# Patient Record
Sex: Female | Born: 1950 | Hispanic: No | Marital: Single | Smoking: Never smoker
Health system: Southern US, Community
[De-identification: ages and names within clinical notes are randomized; demographics above are authoritative.]

## PROBLEM LIST (undated history)

## (undated) DIAGNOSIS — E785 Hyperlipidemia, unspecified: Secondary | ICD-10-CM

## (undated) DIAGNOSIS — E119 Type 2 diabetes mellitus without complications: Secondary | ICD-10-CM

## (undated) DIAGNOSIS — K219 Gastro-esophageal reflux disease without esophagitis: Secondary | ICD-10-CM

## (undated) DIAGNOSIS — K5792 Diverticulitis of intestine, part unspecified, without perforation or abscess without bleeding: Secondary | ICD-10-CM

## (undated) DIAGNOSIS — I10 Essential (primary) hypertension: Secondary | ICD-10-CM

## (undated) HISTORY — DX: Hyperlipidemia, unspecified: E78.5

---

## 2018-02-28 DIAGNOSIS — F330 Major depressive disorder, recurrent, mild: Secondary | ICD-10-CM | POA: Diagnosis not present

## 2018-02-28 DIAGNOSIS — K219 Gastro-esophageal reflux disease without esophagitis: Secondary | ICD-10-CM | POA: Diagnosis not present

## 2018-02-28 DIAGNOSIS — I10 Essential (primary) hypertension: Secondary | ICD-10-CM | POA: Diagnosis not present

## 2018-02-28 DIAGNOSIS — M1990 Unspecified osteoarthritis, unspecified site: Secondary | ICD-10-CM | POA: Diagnosis not present

## 2018-02-28 DIAGNOSIS — E7800 Pure hypercholesterolemia, unspecified: Secondary | ICD-10-CM | POA: Diagnosis not present

## 2018-02-28 DIAGNOSIS — M25562 Pain in left knee: Secondary | ICD-10-CM | POA: Diagnosis not present

## 2018-04-11 DIAGNOSIS — I10 Essential (primary) hypertension: Secondary | ICD-10-CM | POA: Diagnosis not present

## 2018-04-11 DIAGNOSIS — M5432 Sciatica, left side: Secondary | ICD-10-CM | POA: Diagnosis not present

## 2018-04-11 DIAGNOSIS — K219 Gastro-esophageal reflux disease without esophagitis: Secondary | ICD-10-CM | POA: Diagnosis not present

## 2018-04-11 DIAGNOSIS — F330 Major depressive disorder, recurrent, mild: Secondary | ICD-10-CM | POA: Diagnosis not present

## 2018-04-11 DIAGNOSIS — E7800 Pure hypercholesterolemia, unspecified: Secondary | ICD-10-CM | POA: Diagnosis not present

## 2018-05-10 DIAGNOSIS — I10 Essential (primary) hypertension: Secondary | ICD-10-CM | POA: Diagnosis not present

## 2018-05-10 DIAGNOSIS — R7303 Prediabetes: Secondary | ICD-10-CM | POA: Diagnosis not present

## 2018-05-22 DIAGNOSIS — Z23 Encounter for immunization: Secondary | ICD-10-CM | POA: Diagnosis not present

## 2018-05-22 DIAGNOSIS — E119 Type 2 diabetes mellitus without complications: Secondary | ICD-10-CM | POA: Diagnosis not present

## 2018-07-05 ENCOUNTER — Other Ambulatory Visit: Payer: Self-pay | Admitting: Family Medicine

## 2018-07-05 ENCOUNTER — Other Ambulatory Visit (HOSPITAL_COMMUNITY)
Admission: RE | Admit: 2018-07-05 | Discharge: 2018-07-05 | Disposition: A | Discharge status: Home / Self Care | Payer: Medicare Other | Source: Ambulatory Visit | Attending: Family Medicine | Admitting: Family Medicine

## 2018-07-05 DIAGNOSIS — E7800 Pure hypercholesterolemia, unspecified: Secondary | ICD-10-CM | POA: Diagnosis not present

## 2018-07-05 DIAGNOSIS — Z0000 Encounter for general adult medical examination without abnormal findings: Secondary | ICD-10-CM | POA: Diagnosis not present

## 2018-07-05 DIAGNOSIS — Z124 Encounter for screening for malignant neoplasm of cervix: Secondary | ICD-10-CM | POA: Insufficient documentation

## 2018-07-05 DIAGNOSIS — H543 Unqualified visual loss, both eyes: Secondary | ICD-10-CM | POA: Diagnosis not present

## 2018-07-05 DIAGNOSIS — H9193 Unspecified hearing loss, bilateral: Secondary | ICD-10-CM | POA: Diagnosis not present

## 2018-07-05 DIAGNOSIS — I10 Essential (primary) hypertension: Secondary | ICD-10-CM | POA: Diagnosis not present

## 2018-07-05 DIAGNOSIS — K219 Gastro-esophageal reflux disease without esophagitis: Secondary | ICD-10-CM | POA: Diagnosis not present

## 2018-07-05 DIAGNOSIS — Z1389 Encounter for screening for other disorder: Secondary | ICD-10-CM | POA: Diagnosis not present

## 2018-07-05 DIAGNOSIS — E119 Type 2 diabetes mellitus without complications: Secondary | ICD-10-CM | POA: Diagnosis not present

## 2018-07-09 LAB — CYTOLOGY - PAP
Diagnosis: NEGATIVE
HPV: NOT DETECTED

## 2018-07-10 DIAGNOSIS — J069 Acute upper respiratory infection, unspecified: Secondary | ICD-10-CM | POA: Diagnosis not present

## 2018-08-10 DIAGNOSIS — M79641 Pain in right hand: Secondary | ICD-10-CM | POA: Diagnosis not present

## 2018-08-10 DIAGNOSIS — Z6821 Body mass index (BMI) 21.0-21.9, adult: Secondary | ICD-10-CM | POA: Diagnosis not present

## 2018-08-10 DIAGNOSIS — M25512 Pain in left shoulder: Secondary | ICD-10-CM | POA: Diagnosis not present

## 2018-08-11 DIAGNOSIS — Z23 Encounter for immunization: Secondary | ICD-10-CM | POA: Diagnosis not present

## 2018-08-30 DIAGNOSIS — H903 Sensorineural hearing loss, bilateral: Secondary | ICD-10-CM | POA: Diagnosis not present

## 2018-09-05 DIAGNOSIS — H04561 Stenosis of right lacrimal punctum: Secondary | ICD-10-CM | POA: Diagnosis not present

## 2018-09-05 DIAGNOSIS — H2513 Age-related nuclear cataract, bilateral: Secondary | ICD-10-CM | POA: Diagnosis not present

## 2018-09-13 ENCOUNTER — Other Ambulatory Visit: Payer: Self-pay | Admitting: Family Medicine

## 2018-09-13 DIAGNOSIS — Z1231 Encounter for screening mammogram for malignant neoplasm of breast: Secondary | ICD-10-CM

## 2018-10-18 DIAGNOSIS — H2511 Age-related nuclear cataract, right eye: Secondary | ICD-10-CM | POA: Diagnosis not present

## 2019-05-02 DIAGNOSIS — H2512 Age-related nuclear cataract, left eye: Secondary | ICD-10-CM | POA: Diagnosis not present

## 2019-05-22 ENCOUNTER — Other Ambulatory Visit: Payer: Self-pay | Admitting: Nephrology

## 2019-05-22 DIAGNOSIS — N183 Chronic kidney disease, stage 3 unspecified: Secondary | ICD-10-CM

## 2019-07-22 ENCOUNTER — Other Ambulatory Visit: Payer: Medicare Other

## 2019-07-25 ENCOUNTER — Other Ambulatory Visit: Payer: Medicare Other

## 2019-07-30 ENCOUNTER — Other Ambulatory Visit: Payer: Medicare Other

## 2019-09-06 ENCOUNTER — Other Ambulatory Visit: Payer: Self-pay | Admitting: Family Medicine

## 2019-09-06 DIAGNOSIS — E2839 Other primary ovarian failure: Secondary | ICD-10-CM

## 2019-09-06 DIAGNOSIS — Z1231 Encounter for screening mammogram for malignant neoplasm of breast: Secondary | ICD-10-CM

## 2019-12-11 ENCOUNTER — Other Ambulatory Visit: Payer: Self-pay | Admitting: Gastroenterology

## 2019-12-11 DIAGNOSIS — R634 Abnormal weight loss: Secondary | ICD-10-CM

## 2019-12-11 DIAGNOSIS — K921 Melena: Secondary | ICD-10-CM

## 2019-12-11 DIAGNOSIS — R1084 Generalized abdominal pain: Secondary | ICD-10-CM

## 2019-12-11 DIAGNOSIS — D500 Iron deficiency anemia secondary to blood loss (chronic): Secondary | ICD-10-CM

## 2019-12-13 ENCOUNTER — Emergency Department (HOSPITAL_COMMUNITY): Payer: Medicare Other

## 2019-12-13 ENCOUNTER — Other Ambulatory Visit: Payer: Medicare Other

## 2019-12-13 ENCOUNTER — Other Ambulatory Visit: Payer: Self-pay

## 2019-12-13 ENCOUNTER — Inpatient Hospital Stay (HOSPITAL_COMMUNITY)
Admission: EM | Admit: 2019-12-13 | Discharge: 2019-12-17 | DRG: 391 | Disposition: A | Discharge status: Home / Self Care | Payer: Medicare Other | Source: Ambulatory Visit | Attending: Internal Medicine | Admitting: Internal Medicine

## 2019-12-13 ENCOUNTER — Encounter (HOSPITAL_COMMUNITY): Payer: Self-pay | Admitting: Emergency Medicine

## 2019-12-13 DIAGNOSIS — D649 Anemia, unspecified: Secondary | ICD-10-CM | POA: Diagnosis present

## 2019-12-13 DIAGNOSIS — Z8249 Family history of ischemic heart disease and other diseases of the circulatory system: Secondary | ICD-10-CM | POA: Diagnosis not present

## 2019-12-13 DIAGNOSIS — E876 Hypokalemia: Secondary | ICD-10-CM | POA: Diagnosis present

## 2019-12-13 DIAGNOSIS — E119 Type 2 diabetes mellitus without complications: Secondary | ICD-10-CM | POA: Diagnosis not present

## 2019-12-13 DIAGNOSIS — K5730 Diverticulosis of large intestine without perforation or abscess without bleeding: Secondary | ICD-10-CM | POA: Diagnosis present

## 2019-12-13 DIAGNOSIS — E872 Acidosis, unspecified: Secondary | ICD-10-CM | POA: Diagnosis present

## 2019-12-13 DIAGNOSIS — K5732 Diverticulitis of large intestine without perforation or abscess without bleeding: Principal | ICD-10-CM | POA: Diagnosis present

## 2019-12-13 DIAGNOSIS — N19 Unspecified kidney failure: Secondary | ICD-10-CM | POA: Diagnosis present

## 2019-12-13 DIAGNOSIS — K5010 Crohn's disease of large intestine without complications: Secondary | ICD-10-CM | POA: Diagnosis present

## 2019-12-13 DIAGNOSIS — Z803 Family history of malignant neoplasm of breast: Secondary | ICD-10-CM | POA: Diagnosis not present

## 2019-12-13 DIAGNOSIS — Z20822 Contact with and (suspected) exposure to covid-19: Secondary | ICD-10-CM | POA: Diagnosis present

## 2019-12-13 DIAGNOSIS — Z808 Family history of malignant neoplasm of other organs or systems: Secondary | ICD-10-CM | POA: Diagnosis not present

## 2019-12-13 DIAGNOSIS — K219 Gastro-esophageal reflux disease without esophagitis: Secondary | ICD-10-CM | POA: Diagnosis present

## 2019-12-13 DIAGNOSIS — Z8349 Family history of other endocrine, nutritional and metabolic diseases: Secondary | ICD-10-CM

## 2019-12-13 DIAGNOSIS — K859 Acute pancreatitis without necrosis or infection, unspecified: Secondary | ICD-10-CM | POA: Diagnosis not present

## 2019-12-13 DIAGNOSIS — I10 Essential (primary) hypertension: Secondary | ICD-10-CM | POA: Diagnosis present

## 2019-12-13 DIAGNOSIS — K5792 Diverticulitis of intestine, part unspecified, without perforation or abscess without bleeding: Secondary | ICD-10-CM | POA: Diagnosis present

## 2019-12-13 DIAGNOSIS — N179 Acute kidney failure, unspecified: Secondary | ICD-10-CM

## 2019-12-13 DIAGNOSIS — Z7984 Long term (current) use of oral hypoglycemic drugs: Secondary | ICD-10-CM

## 2019-12-13 HISTORY — DX: Essential (primary) hypertension: I10

## 2019-12-13 HISTORY — DX: Type 2 diabetes mellitus without complications: E11.9

## 2019-12-13 HISTORY — DX: Gastro-esophageal reflux disease without esophagitis: K21.9

## 2019-12-13 LAB — COMPREHENSIVE METABOLIC PANEL
ALT: 31 U/L (ref 0–44)
AST: 39 U/L (ref 15–41)
Albumin: 3.3 g/dL — ABNORMAL LOW (ref 3.5–5.0)
Alkaline Phosphatase: 100 U/L (ref 38–126)
Anion gap: 17 — ABNORMAL HIGH (ref 5–15)
BUN: 99 mg/dL — ABNORMAL HIGH (ref 8–23)
CO2: 10 mmol/L — ABNORMAL LOW (ref 22–32)
Calcium: 9.2 mg/dL (ref 8.9–10.3)
Chloride: 112 mmol/L — ABNORMAL HIGH (ref 98–111)
Creatinine, Ser: 3.1 mg/dL — ABNORMAL HIGH (ref 0.44–1.00)
GFR calc Af Amer: 17 mL/min — ABNORMAL LOW (ref >60)
GFR calc non Af Amer: 15 mL/min — ABNORMAL LOW (ref >60)
Glucose, Bld: 173 mg/dL — ABNORMAL HIGH (ref 70–99)
Potassium: 4.2 mmol/L (ref 3.5–5.1)
Sodium: 139 mmol/L (ref 135–145)
Total Bilirubin: 0.9 mg/dL (ref 0.3–1.2)
Total Protein: 7.6 g/dL (ref 6.5–8.1)

## 2019-12-13 LAB — CBC
HCT: 31.9 % — ABNORMAL LOW (ref 36.0–46.0)
Hemoglobin: 9.8 g/dL — ABNORMAL LOW (ref 12.0–15.0)
MCH: 25.7 pg — ABNORMAL LOW (ref 26.0–34.0)
MCHC: 30.7 g/dL (ref 30.0–36.0)
MCV: 83.7 fL (ref 80.0–100.0)
Platelets: 559 10*3/uL — ABNORMAL HIGH (ref 150–400)
RBC: 3.81 MIL/uL — ABNORMAL LOW (ref 3.87–5.11)
RDW: 15.3 % (ref 11.5–15.5)
WBC: 14.4 10*3/uL — ABNORMAL HIGH (ref 4.0–10.5)
nRBC: 0.1 % (ref 0.0–0.2)

## 2019-12-13 LAB — LIPASE, BLOOD: Lipase: 117 U/L — ABNORMAL HIGH (ref 11–51)

## 2019-12-13 LAB — POC OCCULT BLOOD, ED: Fecal Occult Bld: NEGATIVE

## 2019-12-13 LAB — TYPE AND SCREEN
ABO/RH(D): O NEG
Antibody Screen: NEGATIVE

## 2019-12-13 LAB — CBG MONITORING, ED: Glucose-Capillary: 101 mg/dL — ABNORMAL HIGH (ref 70–99)

## 2019-12-13 MED ORDER — SODIUM CHLORIDE 0.9 % IV BOLUS: 1000.0000 mL | Freq: Once | INTRAVENOUS | Status: DC

## 2019-12-13 MED ORDER — INSULIN ASPART 100 UNIT/ML ~~LOC~~ SOLN
0.0000 [IU] | SUBCUTANEOUS | Status: DC
  Filled 2019-12-13: qty 0.06

## 2019-12-13 MED ORDER — ONDANSETRON HCL 4 MG/2ML IJ SOLN
4.0000 mg | Freq: Once | INTRAMUSCULAR | Status: AC
  Administered 2019-12-13: 4 mg via INTRAVENOUS
  Filled 2019-12-13: qty 2

## 2019-12-13 MED ORDER — IOHEXOL 9 MG/ML PO SOLN: 500.0000 mL | ORAL | Status: AC

## 2019-12-13 MED ORDER — METRONIDAZOLE IN NACL 5-0.79 MG/ML-% IV SOLN
500.0000 mg | Freq: Three times a day (TID) | INTRAVENOUS | Status: DC
  Administered 2019-12-14 – 2019-12-17 (×10): 500 mg via INTRAVENOUS
  Filled 2019-12-13 (×10): qty 100

## 2019-12-13 MED ORDER — METRONIDAZOLE IN NACL 5-0.79 MG/ML-% IV SOLN
500.0000 mg | Freq: Once | INTRAVENOUS | Status: AC
  Administered 2019-12-13: 500 mg via INTRAVENOUS
  Filled 2019-12-13: qty 100

## 2019-12-13 MED ORDER — ONDANSETRON HCL 4 MG/2ML IJ SOLN
4.0000 mg | Freq: Four times a day (QID) | INTRAMUSCULAR | Status: DC | PRN
  Administered 2019-12-14 – 2019-12-16 (×5): 4 mg via INTRAVENOUS
  Filled 2019-12-13 (×5): qty 2

## 2019-12-13 MED ORDER — SODIUM CHLORIDE 0.9 % IV SOLN: 1.0000 g | INTRAVENOUS | Status: DC

## 2019-12-13 MED ORDER — MORPHINE SULFATE (PF) 2 MG/ML IV SOLN
1.0000 mg | INTRAVENOUS | Status: DC | PRN
  Administered 2019-12-14 – 2019-12-16 (×4): 2 mg via INTRAVENOUS
  Filled 2019-12-13 (×5): qty 1

## 2019-12-13 MED ORDER — IOHEXOL 9 MG/ML PO SOLN
ORAL | Status: AC
  Administered 2019-12-14: 500 mL via ORAL
  Filled 2019-12-13: qty 1000

## 2019-12-13 MED ORDER — SODIUM CHLORIDE 0.9 % IV SOLN
2.0000 g | Freq: Every day | INTRAVENOUS | Status: DC
  Administered 2019-12-14: 2 g via INTRAVENOUS
  Filled 2019-12-13 (×2): qty 20

## 2019-12-13 MED ORDER — SODIUM CHLORIDE 0.9 % IV BOLUS
1000.0000 mL | Freq: Once | INTRAVENOUS | Status: AC
  Administered 2019-12-13: 20:00:00 1000 mL via INTRAVENOUS

## 2019-12-13 MED ORDER — MORPHINE SULFATE (PF) 4 MG/ML IV SOLN
4.0000 mg | Freq: Once | INTRAVENOUS | Status: AC
  Administered 2019-12-13: 23:00:00 4 mg via INTRAVENOUS
  Filled 2019-12-13: qty 1

## 2019-12-13 MED ORDER — CIPROFLOXACIN IN D5W 400 MG/200ML IV SOLN
400.0000 mg | Freq: Once | INTRAVENOUS | Status: DC
  Filled 2019-12-13: qty 200

## 2019-12-13 MED ORDER — MORPHINE SULFATE (PF) 4 MG/ML IV SOLN
4.0000 mg | Freq: Once | INTRAVENOUS | Status: AC
  Administered 2019-12-13: 20:00:00 4 mg via INTRAVENOUS
  Filled 2019-12-13: qty 1

## 2019-12-13 MED ORDER — STERILE WATER FOR INJECTION IV SOLN
INTRAVENOUS | Status: AC
  Filled 2019-12-13 (×2): qty 850

## 2019-12-13 NOTE — H&P (Signed)
History and Physical    Alfonso Ramus Goetzke ZDG:[id number] DOB: [birth year] DOA: 12/13/2019  PCP: [name], MD   Patient coming from: Home   Chief Complaint: Abdominal pain, change in bowel habits   HPI: Landra Howze is a 69 y.o. female with medical history significant for hypertension, type 2 diabetes mellitus, kidney disease, and GERD, now presenting to the emergency department with abdominal pain, malaise, and change in bowel habits.  History is obtained from patient and also from her daughter by phone.  Patient has been complaining of change in bowel habits for at least a month now, mainly just passing some mucus and sometimes a small amount of blood.  She has had a loss of appetite associated with this, nausea, not much vomiting, and no diarrhea.  The abdominal pain had been mainly in the lower quadrants initially, but now also in the upper abdomen.  She has not noted any fevers.  She is not sure about weight loss but reports not eating much for at least a month now.  She has not noted any change in her urination.  Patient reports a history of kidney disease, but is unable to provide much detail.  Patient's daughter believes that patient is established with a nephrologist within the past couple months.  She has never had a colonoscopy.  Per patient's daughter, the patient has trouble with her hearing and also becomes quite anxious in the doctor's office or hospital.  ED Course: Upon arrival to the ED, patient is found to be afebrile, saturating well on room air, tachycardic in the 110s, and with stable blood pressure.  Chemistry panel is notable for bicarbonate of 10, BUN 99, and creatinine 3.10.  CBC with leukocytosis to 14,400, thrombocytosis, and normocytic anemia with hemoglobin 9.8.  Lipase was elevated to 117.  Fecal occult blood testing was negative.  COVID-19 PCR screening test is in process.  Type and screen was performed and the patient was given 2 L normal saline, Rocephin and  Flagyl, morphine, and Zofran in the ED.  Review of Systems:  All other systems reviewed and apart from HPI, are negative.  Past Medical History:  Diagnosis Date  . Acid reflux   . Diabetes mellitus without complication (HCC)    pre diabetes  . Hypertension     History reviewed. No pertinent surgical history.   reports that she has never smoked. She has never used smokeless tobacco. She reports current alcohol use. No history on file for drug.  No Known Allergies  Family History  Problem Relation Age of Onset  . Lung disease Mother   . Hypertension Father   . Hyperlipidemia Father   . Bowel Disease Sister   . Breast cancer Sister   . Thyroid cancer Sister      Prior to Admission medications   Medication Sig Start Date End Date Taking? Authorizing Provider  amLODipine (NORVASC) 2.5 MG tablet Take 2.5 mg by mouth daily. 12/03/19  Yes [provider]  DULoxetine (CYMBALTA) 30 MG capsule Take 90 mg by mouth daily. 08/19/19  Yes [provider]  lisinopril (ZESTRIL) 20 MG tablet Take 20 mg by mouth daily. 11/04/19  Yes [provider]  metFORMIN (GLUCOPHAGE) 500 MG tablet Take 500 mg by mouth at bedtime. 10/07/19  Yes [provider]  omeprazole (PRILOSEC) 40 MG capsule Take 40 mg by mouth daily. 10/07/19  Yes [provider]  rosuvastatin (CRESTOR) 20 MG tablet Take 20 mg by mouth daily. 10/08/19  Yes [provider]  tiZANidine (ZANAFLEX) 4 MG tablet Take 4 mg by mouth every 8 (eight) hours as needed for muscle spasms.  11/25/19  Yes [provider]    Physical Exam: Vitals:   12/13/19 2130 12/13/19 2230 12/13/19 2300 12/14/19 0000  BP: 116/62 132/76 130/63 125/64  Pulse: 96 (!) 102 96 99  Resp:    14  Temp:      TempSrc:      SpO2: 97% 96% 96% 98%    Constitutional: NAD, calm  Eyes: PERTLA, lids and conjunctivae normal ENMT: Mucous membranes are moist. Posterior pharynx clear of any exudate or lesions.    Neck: normal, supple, no masses, no thyromegaly Respiratory:  no wheezing, no crackles. No accessory muscle use.  Cardiovascular: S1 & S2 heard, regular rate and rhythm. No extremity edema.   Abdomen: Soft, tender in lower quadrants and epigastrium, no rebound pain or guarding. Bowel sounds active.  Musculoskeletal: no clubbing / cyanosis. No joint deformity upper and lower extremities.  Skin: no significant rashes, lesions, ulcers. Poor turgor. Neurologic: No facial asymmetry, no dysarthria. Gross hearing deficit. Sensation intact. Moving all extremities.  Psychiatric: Alert. Has trouble answering some basic questions, though may be more d/t hearing problems than disorientation. Pleasant and cooperative.     Labs on Admission: I have personally reviewed following labs and imaging studies  CBC: Recent Labs  Lab 12/13/19 1646  WBC 14.4*  HGB 9.8*  HCT 31.9*  MCV 83.7  PLT 559*   Basic Metabolic Panel: Recent Labs  Lab 12/13/19 1646  NA 139  K 4.2  CL 112*  CO2 10*  GLUCOSE 173*  BUN 99*  CREATININE 3.10*  CALCIUM 9.2   GFR: CrCl cannot be calculated (Unknown ideal weight.). Liver Function Tests: Recent Labs  Lab 12/13/19 1646  AST 39  ALT 31  ALKPHOS 100  BILITOT 0.9  PROT 7.6  ALBUMIN 3.3*   Recent Labs  Lab 12/13/19 2248  LIPASE 117*   No results for input(s): AMMONIA in the last 168 hours. Coagulation Profile: No results for input(s): INR, PROTIME in the last 168 hours. Cardiac Enzymes: No results for input(s): CKTOTAL, CKMB, CKMBINDEX, TROPONINI in the last 168 hours. BNP (last 3 results) No results for input(s): PROBNP in the last 8760 hours. HbA1C: No results for input(s): HGBA1C in the last 72 hours. CBG: Recent Labs  Lab 12/13/19 2357  GLUCAP 101*   Lipid Profile: No results for input(s): CHOL, HDL, LDLCALC, TRIG, CHOLHDL, LDLDIRECT in the last 72 hours. Thyroid Function Tests: No results for input(s): TSH, T4TOTAL, FREET4, T3FREE,  THYROIDAB in the last 72 hours. Anemia Panel: No results for input(s): VITAMINB12, FOLATE, FERRITIN, TIBC, IRON, RETICCTPCT in the last 72 hours. Urine analysis: No results found for: COLORURINE, APPEARANCEUR, LABSPEC, PHURINE, GLUCOSEU, HGBUR, BILIRUBINUR, KETONESUR, PROTEINUR, UROBILINOGEN, NITRITE, LEUKOCYTESUR Sepsis Labs: @LABRCNTIP (procalcitonin:4,lacticidven:4) )No results found for this or any previous visit (from the past 240 hour(s)).   Radiological Exams on Admission: CT ABDOMEN PELVIS WO CONTRAST  Result Date: 12/13/2019 CLINICAL DATA:  Diverticulitis suspected EXAM: CT ABDOMEN AND PELVIS WITHOUT CONTRAST TECHNIQUE: Multidetector CT imaging of the abdomen and pelvis was performed following the standard protocol without IV contrast. COMPARISON:  None FINDINGS: Lower chest: The basilar atelectatic changes. Lung bases otherwise clear. Normal heart size. No pericardial effusion. Small fat containing Morgagni hernia. Hepatobiliary: No focal liver abnormality is seen. No gallstones, gallbladder wall thickening, or biliary dilatation. Pancreas: There is partial fatty replacement of the pancreas. Some edematous changes in  the pancreatic head are noted with faint peripancreatic inflammation. No pancreatic ductal dilatation is seen. Spleen: Normal in size without focal abnormality. Adrenals/Urinary Tract: Adrenal glands are unremarkable. Kidneys are normal, without renal calculi, focal lesion, or hydronephrosis. Bladder is unremarkable. Stomach/Bowel: Stomach is distended with ingested enteric contrast media. Duodenum takes a normal course. No small bowel dilatation or wall thickening. A normal appendix is visualized. Proximal colon has a normal appearance aside from some mild tortuosity of the transverse colon displaced inferiorly into the lower abdomen. There is extensive distal colonic diverticulosis and segmental thickening of the distal colon with pericolonic inflammatory change, possible culprit  diverticulum in the left lower quadrant (5/62). Adjacent phlegmonous change without free fluid or air. No organized abscess or collection. Vascular/Lymphatic: Atherosclerotic plaque within the normal caliber aorta. No pathologically enlarged nodes in the abdomen or pelvis. Few reactive low mesenteric and upper abdominal nodes. Reproductive: Slightly retroverted uterus. No concerning adnexal lesions. Other: Phlegmonous change in the left lower quadrant adjacent the thickened sigmoid. No free fluid. No free air. No organized collection or abscess. No bowel containing hernia. Musculoskeletal: Dextrocurvature of the lumbar spine with an apex at L2. Multilevel degenerative changes present throughout the imaged spine and both hips. No worrisome osseous lesions are seen. IMPRESSION: 1. Extensive distal colonic diverticulosis and segmental thickening of the distal colon with pericolonic inflammatory change, possible culprit diverticulum in the left lower quadrant. Adjacent phlegmonous change without free fluid or air. No organized collection or abscess. Findings could reflect an acute diverticulitis versus segmental colitis associated with diverticulosis (SCAD). Patient should undergo colonoscopic evaluation following resolution of acute symptoms to exclude underlying mass. 2. Edematous changes of the partially fatty replaced pancreatic parenchyma with adjacent peripancreatic stranding. Recommend correlation with lipase as features could reflect an acute edematous interstitial pancreatitis. No visible calcified gallstones or dilatation of the biliary tree. 3. Small fat containing Morgagni hernia. 4. Multilevel degenerative changes throughout the imaged spine and both hips. 5.  Aortic Atherosclerosis (ICD10-I70.0). These results were called by telephone at the time of interpretation on 12/13/2019 at 10:12 pm to provider [facility] , who verbally acknowledged these results. Electronically Signed   By: Lovena Le M.D.   On:  12/13/2019 22:12    Assessment/Plan   1. Abdominal pain with sepsis suspected secondary to diverticulitis; pancreatitis  - Presents with progressive abdominal pain, nausea, anorexia, and change in bowel-habits  - She has tachycardia and leukocytosis in ED with stable BP, no fever, and CT-findings concerning for possible acute diverticulitis or SCAD, and with underlying mass not excluded; there is also pancreatic edema and inflammation on CT with lipase 117  - She was started on IVF and antibiotics in ED  - Continue IVF hydration, pain-control, Rocephin and Flagyl, bowel-rest, obtain medical records     2. Renal failure; acidosis  - BUN 99 and SCr 3.10 on admission with bicarbonate of 10 and no prior labs available for comparison  - Kidneys appear normal with no hydronephrosis on CT  - Patient reports recent hx of kidney disease but baseline renal fxn unclear  - Acute prerenal azotemia likely in setting of recent N/V and anorexia, compounded by ACE-inhibition   - Hold lisinopril, renally-dose medications, check UA and urine chemistries, start isotonic bicarbonate infusion, repeat chem panel in am, obtain medical records    3. Type II DM  - No A1c on file  - Hold metformin, use low-intensity SSI with Novolog for now    4. Hypertension  - BP at  goal, continue Norvasc    5. Anemia  - Hgb is 9.8 on admission, no priors available for comparison  - FOBT is negative in ED, possibly from CKD though baseline renal function unclear    DVT prophylaxis: sq heparin  Code Status: Full Family Communication: Daughter updated by phone  Consults called: none  Admission status: Inpatient. Patient has multiple acute problems including suspected diverticulitis, renal failure, possibly GI malignancy, is not able to tolerate a diet, and will require inpatient management.     Briscoe Deutscher, MD [facility] Pager [phone]  If 7PM-7AM, please contact night-coverage [email or web address] Password  [facility]  12/14/2019, 12:13 AM

## 2019-12-13 NOTE — ED Notes (Signed)
Pt ambulated to bathroom 

## 2019-12-13 NOTE — ED Notes (Signed)
Patient transported to CT 

## 2019-12-13 NOTE — ED Provider Notes (Signed)
[facility]-EMERGENCY DEPT Provider Note   CSN: [id number] Arrival date & time: 12/13/19  1607     History Chief Complaint  Patient presents with  . Abnormal Lab    Sabrina Rodriguez is a 69 y.o. female.  The history is provided by the patient and medical records. No language interpreter was used.  Abnormal Lab    69 year old female with history of diabetes, hypertension, GERD, sent here at the recommendation of her GI specialist for evaluation of abdominal pain.  Patient report for the past 2-1/2 months she has had progressive worsening pain throughout her abdomen.  Pain initially started at her left lower quadrant and now has spread throughout her abdomen.  Pain is sharp, moderate to severe, with decrease in appetite, having mucus in her stools, as well as having persistent nausea.  States that she has been eating and drinking much.  She does endorse some lightheadedness and generalized weakness.  She does not complain of any fever but endorses occasional chills.  Denies chest pain shortness of breath productive cough or dysuria.  She has not noticed any black tarry stool or any abnormal bleeding.  Patient denies any history of active cancer.  Denies alcohol or tobacco abuse.  No prior history of diverticulitis.  Currently rates her pain as 8 out of 10 throughout abdomen.  Past Medical History:  Diagnosis Date  . Acid reflux   . Diabetes mellitus without complication (HCC)    pre diabetes  . Hypertension     There are no problems to display for this patient.   History reviewed. No pertinent surgical history.   OB History   No obstetric history on file.     No family history on file.  Social History   Tobacco Use  . Smoking status: Not on file  Substance Use Topics  . Alcohol use: Not on file  . Drug use: Not on file    Home Medications Prior to Admission medications   Not on File    Allergies    Patient has no known allergies.  Review  of Systems   Review of Systems  All other systems reviewed and are negative.   Physical Exam Updated Vital Signs BP (!) 127/99 (BP Location: Left Arm)   Pulse (!) 113   Temp 97.9 F (36.6 C) (Oral)   Resp 18   SpO2 100%   Physical Exam Vitals and nursing note reviewed.  Constitutional:      General: She is not in acute distress.    Appearance: She is well-developed.     Comments: Elderly female appears uncomfortable but nontoxic  HENT:     Head: Atraumatic.  Eyes:     Conjunctiva/sclera: Conjunctivae normal.  Cardiovascular:     Rate and Rhythm: Tachycardia present.     Pulses: Normal pulses.     Heart sounds: Normal heart sounds.  Pulmonary:     Effort: Pulmonary effort is normal.     Breath sounds: Normal breath sounds.  Abdominal:     Palpations: Abdomen is soft.     Tenderness: There is abdominal tenderness (Diffuse abdominal tenderness more significant to left lower quadrant on palpation no guarding or rebound tenderness.).  Genitourinary:    Comments: Chaperone present during exam.  Normal rectal tone, no obvious mass, normal color stool on glove, no thrombosed hemorrhoid. Musculoskeletal:     Cervical back: Neck supple.  Skin:    Findings: No rash.  Neurological:     Mental Status: She  is alert and oriented to person, place, and time.  Psychiatric:        Mood and Affect: Mood normal.     ED Results / Procedures / Treatments   Labs (all labs ordered are listed, but only abnormal results are displayed) Labs Reviewed  COMPREHENSIVE METABOLIC PANEL - Abnormal; Notable for the following components:      Result Value   Chloride 112 (*)    CO2 10 (*)    Glucose, Bld 173 (*)    BUN 99 (*)    Creatinine, Ser 3.10 (*)    Albumin 3.3 (*)    GFR calc non Af Amer 15 (*)    GFR calc Af Amer 17 (*)    Anion gap 17 (*)    All other components within normal limits  CBC - Abnormal; Notable for the following components:   WBC 14.4 (*)    RBC 3.81 (*)     Hemoglobin 9.8 (*)    HCT 31.9 (*)    MCH 25.7 (*)    Platelets 559 (*)    All other components within normal limits  LIPASE, BLOOD - Abnormal; Notable for the following components:   Lipase 117 (*)    All other components within normal limits  SARS CORONAVIRUS 2 (TAT 6-24 HRS)  POC OCCULT BLOOD, ED  TYPE AND SCREEN  ABO/RH    EKG None  Radiology CT ABDOMEN PELVIS WO CONTRAST  Result Date: 12/13/2019 CLINICAL DATA:  Diverticulitis suspected EXAM: CT ABDOMEN AND PELVIS WITHOUT CONTRAST TECHNIQUE: Multidetector CT imaging of the abdomen and pelvis was performed following the standard protocol without IV contrast. COMPARISON:  None FINDINGS: Lower chest: The basilar atelectatic changes. Lung bases otherwise clear. Normal heart size. No pericardial effusion. Small fat containing Morgagni hernia. Hepatobiliary: No focal liver abnormality is seen. No gallstones, gallbladder wall thickening, or biliary dilatation. Pancreas: There is partial fatty replacement of the pancreas. Some edematous changes in the pancreatic head are noted with faint peripancreatic inflammation. No pancreatic ductal dilatation is seen. Spleen: Normal in size without focal abnormality. Adrenals/Urinary Tract: Adrenal glands are unremarkable. Kidneys are normal, without renal calculi, focal lesion, or hydronephrosis. Bladder is unremarkable. Stomach/Bowel: Stomach is distended with ingested enteric contrast media. Duodenum takes a normal course. No small bowel dilatation or wall thickening. A normal appendix is visualized. Proximal colon has a normal appearance aside from some mild tortuosity of the transverse colon displaced inferiorly into the lower abdomen. There is extensive distal colonic diverticulosis and segmental thickening of the distal colon with pericolonic inflammatory change, possible culprit diverticulum in the left lower quadrant (5/62). Adjacent phlegmonous change without free fluid or air. No organized abscess or  collection. Vascular/Lymphatic: Atherosclerotic plaque within the normal caliber aorta. No pathologically enlarged nodes in the abdomen or pelvis. Few reactive low mesenteric and upper abdominal nodes. Reproductive: Slightly retroverted uterus. No concerning adnexal lesions. Other: Phlegmonous change in the left lower quadrant adjacent the thickened sigmoid. No free fluid. No free air. No organized collection or abscess. No bowel containing hernia. Musculoskeletal: Dextrocurvature of the lumbar spine with an apex at L2. Multilevel degenerative changes present throughout the imaged spine and both hips. No worrisome osseous lesions are seen. IMPRESSION: 1. Extensive distal colonic diverticulosis and segmental thickening of the distal colon with pericolonic inflammatory change, possible culprit diverticulum in the left lower quadrant. Adjacent phlegmonous change without free fluid or air. No organized collection or abscess. Findings could reflect an acute diverticulitis versus segmental colitis associated with diverticulosis (  SCAD). Patient should undergo colonoscopic evaluation following resolution of acute symptoms to exclude underlying mass. 2. Edematous changes of the partially fatty replaced pancreatic parenchyma with adjacent peripancreatic stranding. Recommend correlation with lipase as features could reflect an acute edematous interstitial pancreatitis. No visible calcified gallstones or dilatation of the biliary tree. 3. Small fat containing Morgagni hernia. 4. Multilevel degenerative changes throughout the imaged spine and both hips. 5.  Aortic Atherosclerosis (ICD10-I70.0). These results were called by telephone at the time of interpretation on 12/13/2019 at 10:12 pm to provider [facility] , who verbally acknowledged these results. Electronically Signed   By: Kreg Shropshire M.D.   On: 12/13/2019 22:12    Procedures Procedures (including critical care time)  Medications Ordered in ED Medications  iohexol  (OMNIPAQUE) 9 MG/ML oral solution 500 mL (has no administration in time range)  iohexol (OMNIPAQUE) 9 MG/ML oral solution (has no administration in time range)  sodium chloride 0.9 % bolus 1,000 mL (0 mLs Intravenous Stopped 12/13/19 2054)  ondansetron (ZOFRAN) injection 4 mg (4 mg Intravenous Given 12/13/19 2009)  morphine 4 MG/ML injection 4 mg (4 mg Intravenous Given 12/13/19 2009)    ED Course  I have reviewed the triage vital signs and the nursing notes.  Pertinent labs & imaging results that were available during my care of the patient were reviewed by me and considered in my medical decision making (see chart for details).    MDM Rules/Calculators/A&P                      BP 116/62   Pulse 96   Temp 97.9 F (36.6 C) (Oral)   Resp 16   SpO2 97%   Final Clinical Impression(s) / ED Diagnoses Final diagnoses:  Acute pancreatitis, unspecified complication status, unspecified pancreatitis type  Diverticulitis  AKI (acute kidney injury) (HCC)    Rx / DC Orders ED Discharge Orders    None     8:00 PM Patient with abdominal pain, and decreased appetite which seems to be a progressive worsening symptoms for the past several weeks.  I suspect patient may have diverticular disease given her presentation.  Labs remarkable for elevated white count of 14.4, hemoglobin is 9.8 however Hemoccult obtained by me is negative.  She has evidence of significant AKI with BUN 99, creatinine 3.1.  No prior renal value for comparison.  Will obtain abdominal pelvis CT scan as well as will consult for admission.  Care discussed with DR. Adriana Simas.    11:11 PM Labs remarkable for elevated lipase of 117, fecal blood test is negative, as mentioned earlier, evidence of AKI with BUN 99, creatinine 3.1 without any prior values for comparison, elevated white count of 14.4, hemoglobin is 9.8.  An abdominal pelvis CT scan demonstrate extensive distal colonic diverticulosis with possible diverticulitis versus colitis.   Furthermore, edematous changes and adjacent peripancreatic stranding suggestive of interstitial pancreatitis.  This finding is corresponding to patient's presenting complaint.  We will continue with IV hydration, pain medication, will initiate Cipro and Flagyl antibiotic to treat for diverticulitis/colitis, and will consult for admission. Her gi specialist is with [facility].   11:25 PM Appreciate consultation from [facility] Dr. Antionette Char who agrees to see and admit pt for further care. No known hx of active cancer.   Alfonso Ramus Holtry was evaluated in Emergency Department on 12/13/2019 for the symptoms described in the history of present illness. She was evaluated in the context of the global COVID-19 pandemic, which necessitated  consideration that the patient might be at risk for infection with the SARS-CoV-2 virus that causes COVID-19. Institutional protocols and algorithms that pertain to the evaluation of patients at risk for COVID-19 are in a state of rapid change based on information released by regulatory bodies including the CDC and federal and state organizations. These policies and algorithms were followed during the patient's care in the ED.    Fayrene Helper, PA-C 12/13/19 2326    Donnetta Hutching, MD 12/14/19 [phone]

## 2019-12-13 NOTE — ED Triage Notes (Signed)
Pt reports that she was seen at [facility] today and had blood work done that was "all abnormal so doctor told me to go to the hospital to be admitted".  Pt reports that blood showed in her stool sample today but she hasnt visual seen blood in stool. C/o headache, back pains, "everything hurts". Reports hasnt been eating because eating causes more nausea.  Showed her cell phone to nurse that son talked to PCP office with lab information stating WBC 15.5, Creatinine 3.5, BUN 99 and blood in stool so Dr wants a CT scan.

## 2019-12-13 NOTE — ED Notes (Signed)
Pt. Documented in error see above note in chart. 

## 2019-12-13 NOTE — ED Notes (Signed)
Blue top in lab if needed.  

## 2019-12-14 ENCOUNTER — Other Ambulatory Visit: Payer: Self-pay

## 2019-12-14 LAB — COMPREHENSIVE METABOLIC PANEL
ALT: 26 U/L (ref 0–44)
AST: 28 U/L (ref 15–41)
Albumin: 2.2 g/dL — ABNORMAL LOW (ref 3.5–5.0)
Alkaline Phosphatase: 69 U/L (ref 38–126)
Anion gap: 12 (ref 5–15)
BUN: 78 mg/dL — ABNORMAL HIGH (ref 8–23)
CO2: 13 mmol/L — ABNORMAL LOW (ref 22–32)
Calcium: 7.1 mg/dL — ABNORMAL LOW (ref 8.9–10.3)
Chloride: 115 mmol/L — ABNORMAL HIGH (ref 98–111)
Creatinine, Ser: 1.88 mg/dL — ABNORMAL HIGH (ref 0.44–1.00)
GFR calc Af Amer: 31 mL/min — ABNORMAL LOW (ref >60)
GFR calc non Af Amer: 27 mL/min — ABNORMAL LOW (ref >60)
Glucose, Bld: 90 mg/dL (ref 70–99)
Potassium: 3 mmol/L — ABNORMAL LOW (ref 3.5–5.1)
Sodium: 140 mmol/L (ref 135–145)
Total Bilirubin: 0.8 mg/dL (ref 0.3–1.2)
Total Protein: 5.4 g/dL — ABNORMAL LOW (ref 6.5–8.1)

## 2019-12-14 LAB — CBC WITH DIFFERENTIAL/PLATELET
Abs Immature Granulocytes: 0.07 10*3/uL (ref 0.00–0.07)
Basophils Absolute: 0 10*3/uL (ref 0.0–0.1)
Basophils Relative: 0 %
Eosinophils Absolute: 0.1 10*3/uL (ref 0.0–0.5)
Eosinophils Relative: 1 %
HCT: 24.9 % — ABNORMAL LOW (ref 36.0–46.0)
Hemoglobin: 7.9 g/dL — ABNORMAL LOW (ref 12.0–15.0)
Immature Granulocytes: 1 %
Lymphocytes Relative: 11 %
Lymphs Abs: 1.1 10*3/uL (ref 0.7–4.0)
MCH: 26.2 pg (ref 26.0–34.0)
MCHC: 31.7 g/dL (ref 30.0–36.0)
MCV: 82.7 fL (ref 80.0–100.0)
Monocytes Absolute: 0.9 10*3/uL (ref 0.1–1.0)
Monocytes Relative: 9 %
Neutro Abs: 8.1 10*3/uL — ABNORMAL HIGH (ref 1.7–7.7)
Neutrophils Relative %: 78 %
Platelets: 451 10*3/uL — ABNORMAL HIGH (ref 150–400)
RBC: 3.01 MIL/uL — ABNORMAL LOW (ref 3.87–5.11)
RDW: 15.2 % (ref 11.5–15.5)
WBC: 10.3 10*3/uL (ref 4.0–10.5)
nRBC: 0 % (ref 0.0–0.2)

## 2019-12-14 LAB — HEMOGLOBIN A1C
Hgb A1c MFr Bld: 6.5 % — ABNORMAL HIGH (ref 4.8–5.6)
Mean Plasma Glucose: 139.85 mg/dL

## 2019-12-14 LAB — URINALYSIS, COMPLETE (UACMP) WITH MICROSCOPIC
Bilirubin Urine: NEGATIVE
Glucose, UA: NEGATIVE mg/dL
Hgb urine dipstick: NEGATIVE
Ketones, ur: 5 mg/dL — AB
Nitrite: NEGATIVE
Protein, ur: 30 mg/dL — AB
Specific Gravity, Urine: 1.011 (ref 1.005–1.030)
pH: 5 (ref 5.0–8.0)

## 2019-12-14 LAB — CBG MONITORING, ED
Glucose-Capillary: 91 mg/dL (ref 70–99)
Glucose-Capillary: 87 mg/dL (ref 70–99)
Glucose-Capillary: 93 mg/dL (ref 70–99)

## 2019-12-14 LAB — CREATININE, URINE, RANDOM: Creatinine, Urine: 104 mg/dL

## 2019-12-14 LAB — SARS CORONAVIRUS 2 (TAT 6-24 HRS): SARS Coronavirus 2: NEGATIVE

## 2019-12-14 LAB — GLUCOSE, CAPILLARY
Glucose-Capillary: 111 mg/dL — ABNORMAL HIGH (ref 70–99)
Glucose-Capillary: 121 mg/dL — ABNORMAL HIGH (ref 70–99)

## 2019-12-14 LAB — HIV ANTIBODY (ROUTINE TESTING W REFLEX): HIV Screen 4th Generation wRfx: NONREACTIVE

## 2019-12-14 LAB — SODIUM, URINE, RANDOM: Sodium, Ur: <10 mmol/L

## 2019-12-14 LAB — ABO/RH: ABO/RH(D): O NEG

## 2019-12-14 MED ORDER — SODIUM CHLORIDE 0.9 % IV SOLN: INTRAVENOUS | Status: DC

## 2019-12-14 MED ORDER — SODIUM CHLORIDE 0.9 % IV SOLN
2.0000 g | INTRAVENOUS | Status: DC
  Administered 2019-12-14 – 2019-12-16 (×3): 2 g via INTRAVENOUS
  Filled 2019-12-14: qty 2
  Filled 2019-12-14 (×2): qty 20
  Filled 2019-12-14: qty 2

## 2019-12-14 MED ORDER — HEPARIN SODIUM (PORCINE) 5000 UNIT/ML IJ SOLN
5000.0000 [IU] | Freq: Three times a day (TID) | INTRAMUSCULAR | Status: DC
  Administered 2019-12-14 – 2019-12-17 (×11): 5000 [IU] via SUBCUTANEOUS
  Filled 2019-12-14 (×11): qty 1

## 2019-12-14 MED ORDER — ACETAMINOPHEN 650 MG RE SUPP: 650.0000 mg | Freq: Four times a day (QID) | RECTAL | Status: DC | PRN

## 2019-12-14 MED ORDER — POTASSIUM CHLORIDE CRYS ER 20 MEQ PO TBCR
20.0000 meq | EXTENDED_RELEASE_TABLET | Freq: Once | ORAL | Status: AC
  Administered 2019-12-14: 07:00:00 20 meq via ORAL
  Filled 2019-12-14: qty 1

## 2019-12-14 MED ORDER — POTASSIUM CHLORIDE 10 MEQ/100ML IV SOLN: 10.0000 meq | INTRAVENOUS | Status: DC

## 2019-12-14 MED ORDER — BOOST / RESOURCE BREEZE PO LIQD CUSTOM
1.0000 | Freq: Three times a day (TID) | ORAL | Status: DC
  Administered 2019-12-14 – 2019-12-17 (×5): 1 via ORAL

## 2019-12-14 MED ORDER — POTASSIUM CHLORIDE 10 MEQ/100ML IV SOLN
10.0000 meq | Freq: Once | INTRAVENOUS | Status: AC
  Administered 2019-12-14: 10 meq via INTRAVENOUS
  Filled 2019-12-14: qty 100

## 2019-12-14 MED ORDER — SODIUM CHLORIDE 0.9% FLUSH
3.0000 mL | Freq: Two times a day (BID) | INTRAVENOUS | Status: DC
  Administered 2019-12-14 – 2019-12-16 (×5): 3 mL via INTRAVENOUS

## 2019-12-14 MED ORDER — ACETAMINOPHEN 325 MG PO TABS
650.0000 mg | ORAL_TABLET | Freq: Four times a day (QID) | ORAL | Status: DC | PRN
  Administered 2019-12-14 – 2019-12-17 (×5): 650 mg via ORAL
  Filled 2019-12-14 (×5): qty 2

## 2019-12-14 MED ORDER — AMLODIPINE BESYLATE 5 MG PO TABS
2.5000 mg | ORAL_TABLET | Freq: Every day | ORAL | Status: DC
  Administered 2019-12-14: 2.5 mg via ORAL
  Filled 2019-12-14: qty 1

## 2019-12-14 NOTE — Progress Notes (Signed)
Pt arrived to room 1514 from ED.

## 2019-12-14 NOTE — ED Notes (Signed)
Pt. Documented in error see note in above chart. 

## 2019-12-14 NOTE — Progress Notes (Signed)
PROGRESS NOTE    Sabrina Rodriguez  CZY:[id number] DOB: 01/14/1951 DOA: 12/13/2019 PCP: [name], MD   Brief Narrative: 69 y.o. female with medical history significant for hypertension, type 2 diabetes mellitus, kidney disease, and GERD, now presenting to the emergency department with abdominal pain, malaise, and change in bowel habits.  History is obtained from patient and also from her daughter by phone.  Patient has been complaining of change in bowel habits for at least a month now, mainly just passing some mucus and sometimes a small amount of blood.  She has had a loss of appetite associated with this, nausea, not much vomiting, and no diarrhea.  The abdominal pain had been mainly in the lower quadrants initially, but now also in the upper abdomen.  She has not noted any fevers.  She is not sure about weight loss but reports not eating much for at least a month now.  She has not noted any change in her urination.  Patient reports a history of kidney disease, but is unable to provide much detail.  Patient's daughter believes that patient is established with a nephrologist within the past couple months.  She has never had a colonoscopy.  Per patient's daughter, the patient has trouble with her hearing and also becomes quite anxious in the doctor's office or hospital.  ED Course: Upon arrival to the ED, patient is found to be afebrile, saturating well on room air, tachycardic in the 110s, and with stable blood pressure.  Chemistry panel is notable for bicarbonate of 10, BUN 99, and creatinine 3.10.  CBC with leukocytosis to 14,400, thrombocytosis, and normocytic anemia with hemoglobin 9.8.  Lipase was elevated to 117.  Fecal occult blood testing was negative.  COVID-19 PCR screening test is in process.  Type and screen was performed and the patient was given 2 L normal saline, Rocephin and Flagyl, morphine, and Zofran in the ED.  Assessment & Plan:   Principal Problem:   Acute  diverticulitis Active Problems:   Renal failure   Metabolic acidosis   Diabetes mellitus type II, non insulin dependent (HCC)   Hypertension   Pancreatitis   #1 pancreatitis/diverticulitis-continue Rocephin and Flagyl.  Lipase at the time of admission was 117.  CT of the abdomen and pelvis shows Extensive distal colonic diverticulosis and segmental thickening of the distal colon with pericolonic inflammatory change, possible culprit diverticulum in the left lower quadrant. Adjacent phlegmonous change without free fluid or air. No organized collection or abscess. Findings could reflect an acute diverticulitis versus segmental colitis associated with diverticulosis (SCAD). Patient should undergo colonoscopic evaluation following resolution of acute symptoms to exclude underlying mass.  Edematous changes of the partially fatty replaced pancreatic parenchyma with adjacent peripancreatic stranding. Recommend correlation with lipase as features could reflect an acute edematous interstitial pancreatitis. No visible calcified gallstones or dilatation of the biliary tree.  Small fat containing Morgagni hernia.  Multilevel degenerative changes throughout the imaged spine and both hips.  Aortic Atherosclerosis   Follow-up labs in a.m.  #2 AKI improving with IV hydration.  Follow-up labs in a.m.  #3 type 2 diabetes continue SSI.  #4 essential hypertension blood pressure soft DC Norvasc.  #5 chronic anemia monitor H&H.  #6 hypokalemia repleted recheck.  Estimated body mass index is 21.26 kg/m as calculated from the following:   Height as of this encounter: 5\' 3"  (1.6 m).   Weight as of this encounter: 54.4 kg.  DVT prophylaxis: Subcu heparin Code Status: Full code  family  Communication: None  disposition Plan: Patient still with abdominal pain pancreatitis diverticulitis not able to tolerate any p.o. intake  Consultants:   None  Procedures:  None Antimicrobials:  Subjective:  She is resting in bed in no acute distress Objective: Vitals:   12/14/19 1130 12/14/19 1148 12/14/19 1239 12/14/19 1321  BP: (!) 106/49 (!) 99/56 109/60 116/62  Pulse: 99 96 100 96  Resp:    18  Temp:    98.5 F (36.9 C)  TempSrc:    Oral  SpO2: 95% 100% 95% 97%  Weight:      Height:        Intake/Output Summary (Last 24 hours) at 12/14/2019 1532 Last data filed at 12/14/2019 0133 Gross per 24 hour  Intake 1103 ml  Output --  Net 1103 ml   Filed Weights   12/14/19 0722  Weight: 54.4 kg    Examination:  General exam: Appears calm and comfortable  Respiratory system: Clear to auscultation. Respiratory effort normal. Cardiovascular system: S1 & S2 heard, RRR. No JVD, murmurs, rubs, gallops or clicks. No pedal edema. Gastrointestinal system: Abdomen is nondistended, soft and nontender. No organomegaly or masses felt. Normal bowel sounds heard. Central nervous system: Alert and oriented. No focal neurological deficits. Extremities: Symmetric 5 x 5 power. Skin: No rashes, lesions or ulcers Psychiatry: Judgement and insight appear normal. Mood & affect appropriate.     Data Reviewed: I have personally reviewed following labs and imaging studies  CBC: Recent Labs  Lab 12/13/19 1646 12/14/19 0516  WBC 14.4* 10.3  NEUTROABS  --  8.1*  HGB 9.8* 7.9*  HCT 31.9* 24.9*  MCV 83.7 82.7  PLT 559* 451*   Basic Metabolic Panel: Recent Labs  Lab 12/13/19 1646 12/14/19 0516  NA 139 140  K 4.2 3.0*  CL 112* 115*  CO2 10* 13*  GLUCOSE 173* 90  BUN 99* 78*  CREATININE 3.10* 1.88*  CALCIUM 9.2 7.1*   GFR: Estimated Creatinine Clearance: 24 mL/min (A) (by C-G formula based on SCr of 1.88 mg/dL (H)). Liver Function Tests: Recent Labs  Lab 12/13/19 1646 12/14/19 0516  AST 39 28  ALT 31 26  ALKPHOS 100 69  BILITOT 0.9 0.8  PROT 7.6 5.4*  ALBUMIN 3.3* 2.2*   Recent Labs  Lab 12/13/19 2248  LIPASE 117*   No results for  input(s): AMMONIA in the last 168 hours. Coagulation Profile: No results for input(s): INR, PROTIME in the last 168 hours. Cardiac Enzymes: No results for input(s): CKTOTAL, CKMB, CKMBINDEX, TROPONINI in the last 168 hours. BNP (last 3 results) No results for input(s): PROBNP in the last 8760 hours. HbA1C: Recent Labs    12/14/19 0516  HGBA1C 6.5*   CBG: Recent Labs  Lab 12/13/19 2357 12/14/19 0414 12/14/19 0751 12/14/19 1149  GLUCAP 101* 91 87 93   Lipid Profile: No results for input(s): CHOL, HDL, LDLCALC, TRIG, CHOLHDL, LDLDIRECT in the last 72 hours. Thyroid Function Tests: No results for input(s): TSH, T4TOTAL, FREET4, T3FREE, THYROIDAB in the last 72 hours. Anemia Panel: No results for input(s): VITAMINB12, FOLATE, FERRITIN, TIBC, IRON, RETICCTPCT in the last 72 hours. Sepsis Labs: No results for input(s): PROCALCITON, LATICACIDVEN in the last 168 hours.  Recent Results (from the past 240 hour(s))  SARS CORONAVIRUS 2 (TAT 6-24 HRS) Nasopharyngeal Nasopharyngeal Swab     Status: None   Collection Time: 12/13/19 11:33 PM   Specimen: Nasopharyngeal Swab  Result Value Ref Range Status   SARS Coronavirus 2 NEGATIVE NEGATIVE Final  Comment: (NOTE) SARS-CoV-2 target nucleic acids are NOT DETECTED. The SARS-CoV-2 RNA is generally detectable in upper and lower respiratory specimens during the acute phase of infection. Negative results do not preclude SARS-CoV-2 infection, do not rule out co-infections with other pathogens, and should not be used as the sole basis for treatment or other patient management decisions. Negative results must be combined with clinical observations, patient history, and epidemiological information. The expected result is Negative. Fact Sheet for Patients: HairSlick.no Fact Sheet for Healthcare Providers: quierodirigir.com This test is not yet approved or cleared by the [place] FDA  and  has been authorized for detection and/or diagnosis of SARS-CoV-2 by FDA under an Emergency Use Authorization (EUA). This EUA will remain  in effect (meaning this test can be used) for the duration of the COVID-19 declaration under Section 56 4(b)(1) of the Act, 21 U.S.C. section 360bbb-3(b)(1), unless the authorization is terminated or revoked sooner. Performed at [facility], [address] [place], [place] [address]          Radiology Studies: CT ABDOMEN PELVIS WO CONTRAST  Result Date: 12/13/2019 CLINICAL DATA:  Diverticulitis suspected EXAM: CT ABDOMEN AND PELVIS WITHOUT CONTRAST TECHNIQUE: Multidetector CT imaging of the abdomen and pelvis was performed following the standard protocol without IV contrast. COMPARISON:  None FINDINGS: Lower chest: The basilar atelectatic changes. Lung bases otherwise clear. Normal heart size. No pericardial effusion. Small fat containing Morgagni hernia. Hepatobiliary: No focal liver abnormality is seen. No gallstones, gallbladder wall thickening, or biliary dilatation. Pancreas: There is partial fatty replacement of the pancreas. Some edematous changes in the pancreatic head are noted with faint peripancreatic inflammation. No pancreatic ductal dilatation is seen. Spleen: Normal in size without focal abnormality. Adrenals/Urinary Tract: Adrenal glands are unremarkable. Kidneys are normal, without renal calculi, focal lesion, or hydronephrosis. Bladder is unremarkable. Stomach/Bowel: Stomach is distended with ingested enteric contrast media. Duodenum takes a normal course. No small bowel dilatation or wall thickening. A normal appendix is visualized. Proximal colon has a normal appearance aside from some mild tortuosity of the transverse colon displaced inferiorly into the lower abdomen. There is extensive distal colonic diverticulosis and segmental thickening of the distal colon with pericolonic inflammatory change, possible culprit diverticulum  in the left lower quadrant (5/62). Adjacent phlegmonous change without free fluid or air. No organized abscess or collection. Vascular/Lymphatic: Atherosclerotic plaque within the normal caliber aorta. No pathologically enlarged nodes in the abdomen or pelvis. Few reactive low mesenteric and upper abdominal nodes. Reproductive: Slightly retroverted uterus. No concerning adnexal lesions. Other: Phlegmonous change in the left lower quadrant adjacent the thickened sigmoid. No free fluid. No free air. No organized collection or abscess. No bowel containing hernia. Musculoskeletal: Dextrocurvature of the lumbar spine with an apex at L2. Multilevel degenerative changes present throughout the imaged spine and both hips. No worrisome osseous lesions are seen. IMPRESSION: 1. Extensive distal colonic diverticulosis and segmental thickening of the distal colon with pericolonic inflammatory change, possible culprit diverticulum in the left lower quadrant. Adjacent phlegmonous change without free fluid or air. No organized collection or abscess. Findings could reflect an acute diverticulitis versus segmental colitis associated with diverticulosis (SCAD). Patient should undergo colonoscopic evaluation following resolution of acute symptoms to exclude underlying mass. 2. Edematous changes of the partially fatty replaced pancreatic parenchyma with adjacent peripancreatic stranding. Recommend correlation with lipase as features could reflect an acute edematous interstitial pancreatitis. No visible calcified gallstones or dilatation of the biliary tree. 3. Small fat containing Morgagni hernia. 4.  Multilevel degenerative changes throughout the imaged spine and both hips. 5.  Aortic Atherosclerosis (ICD10-I70.0). These results were called by telephone at the time of interpretation on 12/13/2019 at 10:12 pm to provider [facility] , who verbally acknowledged these results. Electronically Signed   By: Lovena Le M.D.   On: 12/13/2019  22:12        Scheduled Meds: . amLODipine  2.5 mg Oral Daily  . feeding supplement  1 Container Oral TID BM  . heparin  5,000 Units Subcutaneous Q8H  . insulin aspart  0-6 Units Subcutaneous Q4H  . sodium chloride flush  3 mL Intravenous Q12H   Continuous Infusions: . cefTRIAXone (ROCEPHIN)  IV Stopped (12/14/19 0202)  . metronidazole 500 mg (12/14/19 1429)     LOS: 1 day     Georgette Shell, MD [facility]  If 7PM-7AM, please contact night-coverage [email or web address] Password [facility] 12/14/2019, 3:32 PM

## 2019-12-14 NOTE — ED Notes (Signed)
Purewick was placed on pt. Nurse aware

## 2019-12-14 NOTE — ED Notes (Signed)
I have just given report to Joaquin Bend, RN on [address]. Will transport shortly. Pt. Remains in no distress.

## 2019-12-15 LAB — LIPASE, BLOOD: Lipase: 49 U/L (ref 11–51)

## 2019-12-15 LAB — COMPREHENSIVE METABOLIC PANEL
ALT: 30 U/L (ref 0–44)
AST: 30 U/L (ref 15–41)
Albumin: 2.1 g/dL — ABNORMAL LOW (ref 3.5–5.0)
Alkaline Phosphatase: 68 U/L (ref 38–126)
Anion gap: 10 (ref 5–15)
BUN: 41 mg/dL — ABNORMAL HIGH (ref 8–23)
CO2: 21 mmol/L — ABNORMAL LOW (ref 22–32)
Calcium: 7.8 mg/dL — ABNORMAL LOW (ref 8.9–10.3)
Chloride: 115 mmol/L — ABNORMAL HIGH (ref 98–111)
Creatinine, Ser: 1.22 mg/dL — ABNORMAL HIGH (ref 0.44–1.00)
GFR calc Af Amer: 53 mL/min — ABNORMAL LOW (ref >60)
GFR calc non Af Amer: 46 mL/min — ABNORMAL LOW (ref >60)
Glucose, Bld: 109 mg/dL — ABNORMAL HIGH (ref 70–99)
Potassium: 4.1 mmol/L (ref 3.5–5.1)
Sodium: 146 mmol/L — ABNORMAL HIGH (ref 135–145)
Total Bilirubin: 0.5 mg/dL (ref 0.3–1.2)
Total Protein: 5.2 g/dL — ABNORMAL LOW (ref 6.5–8.1)

## 2019-12-15 LAB — GLUCOSE, CAPILLARY
Glucose-Capillary: 103 mg/dL — ABNORMAL HIGH (ref 70–99)
Glucose-Capillary: 104 mg/dL — ABNORMAL HIGH (ref 70–99)
Glucose-Capillary: 115 mg/dL — ABNORMAL HIGH (ref 70–99)
Glucose-Capillary: 91 mg/dL (ref 70–99)
Glucose-Capillary: 91 mg/dL (ref 70–99)
Glucose-Capillary: 91 mg/dL (ref 70–99)
Glucose-Capillary: 96 mg/dL (ref 70–99)

## 2019-12-15 LAB — CBC
HCT: 24.5 % — ABNORMAL LOW (ref 36.0–46.0)
Hemoglobin: 7.7 g/dL — ABNORMAL LOW (ref 12.0–15.0)
MCH: 25.9 pg — ABNORMAL LOW (ref 26.0–34.0)
MCHC: 31.4 g/dL (ref 30.0–36.0)
MCV: 82.5 fL (ref 80.0–100.0)
Platelets: 446 10*3/uL — ABNORMAL HIGH (ref 150–400)
RBC: 2.97 MIL/uL — ABNORMAL LOW (ref 3.87–5.11)
RDW: 14.9 % (ref 11.5–15.5)
WBC: 9.4 10*3/uL (ref 4.0–10.5)
nRBC: 0 % (ref 0.0–0.2)

## 2019-12-15 MED ORDER — PSYLLIUM 95 % PO PACK
1.0000 | PACK | Freq: Two times a day (BID) | ORAL | Status: DC
  Administered 2019-12-15 – 2019-12-17 (×4): 1 via ORAL
  Filled 2019-12-15 (×6): qty 1

## 2019-12-15 MED ORDER — LORAZEPAM 0.5 MG PO TABS
0.5000 mg | ORAL_TABLET | Freq: Two times a day (BID) | ORAL | Status: DC | PRN
  Administered 2019-12-15: 12:00:00 0.5 mg via ORAL
  Filled 2019-12-15: qty 1

## 2019-12-15 NOTE — Progress Notes (Signed)
This shift pt very tearful as RN started assessment. Dealing with family crisis with sister. Text paged chaplain, who returned call  And s/w patient directly. Patient expressed gratitude.

## 2019-12-15 NOTE — Progress Notes (Signed)
PROGRESS NOTE    Sabrina Rodriguez  PJK:[id number] DOB: 21-Jan-1951 DOA: 12/13/2019 PCP: [name], MD    Brief Narrative: 69 y.o.femalewith medical history significant forhypertension, type 2 diabetes mellitus, kidney disease, and GERD, now presenting to the emergency department with abdominal pain, malaise, and change in bowel habits. History is obtained from patient and also from her daughter by phone. Patient has been complaining of change in bowel habits for at least a month now, mainly just passing some mucus and sometimes a small amount of blood. She has had a loss of appetite associated with this, nausea, not much vomiting, and no diarrhea. The abdominal pain had been mainly in the lower quadrants initially, but now also in the upper abdomen. She has not noted any fevers. She is not sure about weight loss but reports not eating much for at least a month now. She has not noted any change in her urination. Patient reports a history of kidney disease, but is unable to provide much detail. Patient's daughter believes that patient is established with a nephrologist within the past couple months. She has never had a colonoscopy. Per patient's daughter, the patient has trouble with her hearing and also becomes quite anxious in the doctor's office or hospital.  ED Course:Upon arrival to the ED, patient is found to be afebrile, saturating well on room air, tachycardic in the 110s, and with stable blood pressure. Chemistry panel is notable for bicarbonate of 10, BUN 99, and creatinine 3.10. CBC with leukocytosis to 14,400, thrombocytosis, and normocytic anemia with hemoglobin 9.8. Lipase was elevated to 117. Fecal occult blood testing was negative. COVID-19 PCR screening test is in process. Type and screen was performed and the patient was given 2 L normal saline, Rocephin and Flagyl, morphine, and Zofran in the ED.  Assessment & Plan:   Principal Problem:   Acute  diverticulitis Active Problems:   Renal failure   Metabolic acidosis   Diabetes mellitus type II, non insulin dependent (HCC)   Hypertension   Pancreatitis  #1 pancreatitis/diverticulitis-continue Rocephin and Flagyl.  Lipase at the time of admission was 117 down to 49 today  CT of the abdomen and pelvis shows Extensive distal colonic diverticulosis and segmental thickening of the distal colon with pericolonic inflammatory change, possible culprit diverticulum in the left lower quadrant. Adjacent phlegmonous change without free fluid or air. No organized collection or abscess. Findings could reflect an acute diverticulitis versus segmental colitis associated with diverticulosis (SCAD). Patient should undergo colonoscopic evaluation following resolution of acute symptoms to exclude underlying mass.  Edematous changes of the partially fatty replaced pancreatic parenchyma with adjacent peripancreatic stranding. Recommend correlation with lipase as features could reflect an acute edematous interstitial pancreatitis. No visible calcified gallstones or dilatation of the biliary tree.  Small fat containing Morgagni hernia.  Multilevel degenerative changes throughout the imaged spine and both hips.  Started full liquid diet today.  Appreciate GI input. Metamucil twice a day started today. Follow-up with GI 6 to 8 weeks for outpatient colonoscopy.   #2 AKI improving with IV hydration.  Creatinine 1.22 down from 3.1 at the time of admission.   #3 type 2 diabetes continue SSI. CBG (last 3)  Recent Labs    12/15/19 0410 12/15/19 0743 12/15/19 1202  GLUCAP 104* 91 91    #4 essential hypertension blood pressure improving though still soft at 117/66 continue to hold Norvasc.   #5 chronic anemia-normocytic FOBT negative check anemia panel.  At the time of admission hemoglobin 9.8  down to 7.7 likely from hemodilution no evidence of active bleeding noted. monitor H&H.  #6  hypokalemia resolved potassium 4.1.  Estimated body mass index is 21.26 kg/m as calculated from the following:   Height as of this encounter: 5\' 3"  (1.6 m).   Weight as of this encounter: 54.4 kg.   DVT prophylaxis: Subcu heparin Code Status: Full code  family Communication: None  disposition Plan: Patient still with abdominal pain pancreatitis diverticulitis not able to tolerate any p.o. intake  Consultants:   None  Procedures: None Antimicrobials:   Subjective: Patient resting in bed complains of pain in the left lower abdomen but wants to start eating  Objective: Vitals:   12/14/19 1321 12/14/19 1747 12/14/19 2021 12/15/19 0408  BP: 116/62 133/68 126/70 117/66  Pulse: 96 96 96 93  Resp: 18  18 18   Temp: 98.5 F (36.9 C) 98.9 F (37.2 C) 99.5 F (37.5 C) 99.3 F (37.4 C)  TempSrc: Oral Oral Oral Oral  SpO2: 97% 97% 98% 94%  Weight:      Height:        Intake/Output Summary (Last 24 hours) at 12/15/2019 1329 Last data filed at 12/15/2019 0300 Gross per 24 hour  Intake 1000 ml  Output --  Net 1000 ml   Filed Weights   12/14/19 0722  Weight: 54.4 kg    Examination:  General exam: Appears calm and comfortable  Respiratory system: Clear to auscultation. Respiratory effort normal. Cardiovascular system: S1 & S2 heard, RRR. No JVD, murmurs, rubs, gallops or clicks. No pedal edema. Gastrointestinal system: Abdomen is nondistended, soft and left lower quadrant tender. No organomegaly or masses felt. Normal bowel sounds heard. Central nervous system: Alert and oriented. No focal neurological deficits. Extremities: Symmetric 5 x 5 power. Skin: No rashes, lesions or ulcers Psychiatry: Judgement and insight appear normal. Mood & affect appropriate.     Data Reviewed: I have personally reviewed following labs and imaging studies  CBC: Recent Labs  Lab 12/13/19 1646 12/14/19 0516 12/15/19 0547  WBC 14.4* 10.3 9.4  NEUTROABS  --  8.1*  --   HGB 9.8* 7.9*  7.7*  HCT 31.9* 24.9* 24.5*  MCV 83.7 82.7 82.5  PLT 559* 451* 371*   Basic Metabolic Panel: Recent Labs  Lab 12/13/19 1646 12/14/19 0516 12/15/19 0547  NA 139 140 146*  K 4.2 3.0* 4.1  CL 112* 115* 115*  CO2 10* 13* 21*  GLUCOSE 173* 90 109*  BUN 99* 78* 41*  CREATININE 3.10* 1.88* 1.22*  CALCIUM 9.2 7.1* 7.8*   GFR: Estimated Creatinine Clearance: 37 mL/min (A) (by C-G formula based on SCr of 1.22 mg/dL (H)). Liver Function Tests: Recent Labs  Lab 12/13/19 1646 12/14/19 0516 12/15/19 0547  AST 39 28 30  ALT 31 26 30   ALKPHOS 100 69 68  BILITOT 0.9 0.8 0.5  PROT 7.6 5.4* 5.2*  ALBUMIN 3.3* 2.2* 2.1*   Recent Labs  Lab 12/13/19 2248 12/15/19 0547  LIPASE 117* 49   No results for input(s): AMMONIA in the last 168 hours. Coagulation Profile: No results for input(s): INR, PROTIME in the last 168 hours. Cardiac Enzymes: No results for input(s): CKTOTAL, CKMB, CKMBINDEX, TROPONINI in the last 168 hours. BNP (last 3 results) No results for input(s): PROBNP in the last 8760 hours. HbA1C: Recent Labs    12/14/19 0516  HGBA1C 6.5*   CBG: Recent Labs  Lab 12/14/19 2023 12/15/19 0016 12/15/19 0410 12/15/19 0743 12/15/19 1202  GLUCAP 121* 103* 104*  91 91   Lipid Profile: No results for input(s): CHOL, HDL, LDLCALC, TRIG, CHOLHDL, LDLDIRECT in the last 72 hours. Thyroid Function Tests: No results for input(s): TSH, T4TOTAL, FREET4, T3FREE, THYROIDAB in the last 72 hours. Anemia Panel: No results for input(s): VITAMINB12, FOLATE, FERRITIN, TIBC, IRON, RETICCTPCT in the last 72 hours. Sepsis Labs: No results for input(s): PROCALCITON, LATICACIDVEN in the last 168 hours.  Recent Results (from the past 240 hour(s))  SARS CORONAVIRUS 2 (TAT 6-24 HRS) Nasopharyngeal Nasopharyngeal Swab     Status: None   Collection Time: 12/13/19 11:33 PM   Specimen: Nasopharyngeal Swab  Result Value Ref Range Status   SARS Coronavirus 2 NEGATIVE NEGATIVE Final    Comment:  (NOTE) SARS-CoV-2 target nucleic acids are NOT DETECTED. The SARS-CoV-2 RNA is generally detectable in upper and lower respiratory specimens during the acute phase of infection. Negative results do not preclude SARS-CoV-2 infection, do not rule out co-infections with other pathogens, and should not be used as the sole basis for treatment or other patient management decisions. Negative results must be combined with clinical observations, patient history, and epidemiological information. The expected result is Negative. Fact Sheet for Patients: HairSlick.no Fact Sheet for Healthcare Providers: quierodirigir.com This test is not yet approved or cleared by the [place] FDA and  has been authorized for detection and/or diagnosis of SARS-CoV-2 by FDA under an Emergency Use Authorization (EUA). This EUA will remain  in effect (meaning this test can be used) for the duration of the COVID-19 declaration under Section 56 4(b)(1) of the Act, 21 U.S.C. section 360bbb-3(b)(1), unless the authorization is terminated or revoked sooner. Performed at [facility], [address] [place], [place] [address]          Radiology Studies: CT ABDOMEN PELVIS WO CONTRAST  Result Date: 12/13/2019 CLINICAL DATA:  Diverticulitis suspected EXAM: CT ABDOMEN AND PELVIS WITHOUT CONTRAST TECHNIQUE: Multidetector CT imaging of the abdomen and pelvis was performed following the standard protocol without IV contrast. COMPARISON:  None FINDINGS: Lower chest: The basilar atelectatic changes. Lung bases otherwise clear. Normal heart size. No pericardial effusion. Small fat containing Morgagni hernia. Hepatobiliary: No focal liver abnormality is seen. No gallstones, gallbladder wall thickening, or biliary dilatation. Pancreas: There is partial fatty replacement of the pancreas. Some edematous changes in the pancreatic head are noted with faint peripancreatic  inflammation. No pancreatic ductal dilatation is seen. Spleen: Normal in size without focal abnormality. Adrenals/Urinary Tract: Adrenal glands are unremarkable. Kidneys are normal, without renal calculi, focal lesion, or hydronephrosis. Bladder is unremarkable. Stomach/Bowel: Stomach is distended with ingested enteric contrast media. Duodenum takes a normal course. No small bowel dilatation or wall thickening. A normal appendix is visualized. Proximal colon has a normal appearance aside from some mild tortuosity of the transverse colon displaced inferiorly into the lower abdomen. There is extensive distal colonic diverticulosis and segmental thickening of the distal colon with pericolonic inflammatory change, possible culprit diverticulum in the left lower quadrant (5/62). Adjacent phlegmonous change without free fluid or air. No organized abscess or collection. Vascular/Lymphatic: Atherosclerotic plaque within the normal caliber aorta. No pathologically enlarged nodes in the abdomen or pelvis. Few reactive low mesenteric and upper abdominal nodes. Reproductive: Slightly retroverted uterus. No concerning adnexal lesions. Other: Phlegmonous change in the left lower quadrant adjacent the thickened sigmoid. No free fluid. No free air. No organized collection or abscess. No bowel containing hernia. Musculoskeletal: Dextrocurvature of the lumbar spine with an apex at L2. Multilevel degenerative changes present throughout  the imaged spine and both hips. No worrisome osseous lesions are seen. IMPRESSION: 1. Extensive distal colonic diverticulosis and segmental thickening of the distal colon with pericolonic inflammatory change, possible culprit diverticulum in the left lower quadrant. Adjacent phlegmonous change without free fluid or air. No organized collection or abscess. Findings could reflect an acute diverticulitis versus segmental colitis associated with diverticulosis (SCAD). Patient should undergo colonoscopic  evaluation following resolution of acute symptoms to exclude underlying mass. 2. Edematous changes of the partially fatty replaced pancreatic parenchyma with adjacent peripancreatic stranding. Recommend correlation with lipase as features could reflect an acute edematous interstitial pancreatitis. No visible calcified gallstones or dilatation of the biliary tree. 3. Small fat containing Morgagni hernia. 4. Multilevel degenerative changes throughout the imaged spine and both hips. 5.  Aortic Atherosclerosis (ICD10-I70.0). These results were called by telephone at the time of interpretation on 12/13/2019 at 10:12 pm to provider [facility] , who verbally acknowledged these results. Electronically Signed   By: Kreg Shropshire M.D.   On: 12/13/2019 22:12        Scheduled Meds: . feeding supplement  1 Container Oral TID BM  . heparin  5,000 Units Subcutaneous Q8H  . insulin aspart  0-6 Units Subcutaneous Q4H  . psyllium  1 packet Oral BID  . sodium chloride flush  3 mL Intravenous Q12H   Continuous Infusions: . sodium chloride 100 mL/hr at 12/14/19 1600  . cefTRIAXone (ROCEPHIN)  IV 2 g (12/14/19 2220)  . metronidazole 500 mg (12/15/19 0542)     LOS: 2 days     Alwyn Ren, MD [facility]  If 7PM-7AM, please contact night-coverage [email or web address] Password [facility] 12/15/2019, 1:29 PM

## 2019-12-15 NOTE — Consult Note (Addendum)
[facility] Consult  Referring Provider: Alwyn Ren, MD Primary Care Physician:  [name], MD Primary Gastroenterologist: Dr.Magod/[facility]  Reason for Consultation: Diverticulitis  HPI: Sabrina Rodriguez is a 69 y.o. female who is known to the ED with an abnormal CAT scan. Patient states she was in her usual state of health until 2 months ago, when she developed left-sided abdominal pain associated with passage of bloody mucus. She normally would have 1 bowel movement a week, has not needed laxatives in the past.  Patient denies unintentional weight loss or loss of appetite. She has had nausea but no vomiting. Patient has acid reflux and takes omeprazole with well control of her symptoms and denies difficulty swallowing or pain on swallowing. She has never had a colonoscopy before, she was advised to get a CAT scan as an outpatient and then the plan was to get a colonoscopy thereafter. CT from 12/13/2019 without contrast showed fatty replacement of pancreas, partial, edematous change in pancreatic head with feet peripancreatic inflammation.  Extensive colonic diverticulosis with segmental thickening of distal colon with pericolonic inflammatory change, possible diverticulum in left lower quadrant with adjacent phlegmonous change without free fluid, abscess or collection could represent acute diverticulitis versus segmental colitis associated with diverticulosis and a colonoscopy evaluation was recommended after resolution of acute symptoms to exclude underlying mass. Lipase was also minimally elevated at 117.  Patient states there is no family history of IBD or colon cancer.  (Patient initially said she was hard of hearing, when I started talking loudly so she could understand me well, she started crying and would not stop.)   Past Medical History:  Diagnosis Date  . Acid reflux   . Diabetes mellitus without complication (HCC)    pre diabetes  . Hypertension      History reviewed. No pertinent surgical history.  Prior to Admission medications   Medication Sig Start Date End Date Taking? Authorizing Provider  amLODipine (NORVASC) 2.5 MG tablet Take 2.5 mg by mouth daily. 12/03/19  Yes [provider]  DULoxetine (CYMBALTA) 30 MG capsule Take 90 mg by mouth daily. 08/19/19  Yes [provider]  lisinopril (ZESTRIL) 20 MG tablet Take 20 mg by mouth daily. 11/04/19  Yes [provider]  metFORMIN (GLUCOPHAGE) 500 MG tablet Take 500 mg by mouth at bedtime. 10/07/19  Yes [provider]  omeprazole (PRILOSEC) 40 MG capsule Take 40 mg by mouth daily. 10/07/19  Yes [provider]  rosuvastatin (CRESTOR) 20 MG tablet Take 20 mg by mouth daily. 10/08/19  Yes [provider]  tiZANidine (ZANAFLEX) 4 MG tablet Take 4 mg by mouth every 8 (eight) hours as needed for muscle spasms.  11/25/19  Yes [provider]    Current Facility-Administered Medications  Medication Dose Route Frequency Provider Last Rate Last Admin  . 0.9 %  sodium chloride infusion   Intravenous Continuous Alwyn Ren, MD 100 mL/hr at 12/14/19 1600 New Bag at 12/14/19 1600  . acetaminophen (TYLENOL) tablet 650 mg  650 mg Oral Q6H PRN Opyd, Lavone Neri, MD   650 mg at 12/14/19 1149   Or  . acetaminophen (TYLENOL) suppository 650 mg  650 mg Rectal Q6H PRN Opyd, Lavone Neri, MD      . cefTRIAXone (ROCEPHIN) 2 g in sodium chloride 0.9 % 100 mL IVPB  2 g Intravenous Q24H Alwyn Ren, MD 200 mL/hr at 12/14/19 2220 2 g at 12/14/19 2220  . feeding supplement (BOOST / RESOURCE BREEZE) liquid  1 Container  1 Container Oral TID BM Georgette Shell, MD   1 Container at 12/14/19 1432  . heparin injection 5,000 Units  5,000 Units Subcutaneous Q8H Opyd, Ilene Qua, MD   5,000 Units at 12/15/19 0541  . insulin aspart (novoLOG) injection 0-6 Units  0-6 Units Subcutaneous Q4H Opyd, Timothy S, MD      . metroNIDAZOLE (FLAGYL) IVPB 500  mg  500 mg Intravenous Q8H Opyd, Ilene Qua, MD 100 mL/hr at 12/15/19 0542 500 mg at 12/15/19 0542  . morphine 2 MG/ML injection 1-3 mg  1-3 mg Intravenous Q4H PRN Opyd, Ilene Qua, MD   2 mg at 12/14/19 1850  . ondansetron (ZOFRAN) injection 4 mg  4 mg Intravenous Q6H PRN Opyd, Ilene Qua, MD   4 mg at 12/15/19 1018  . sodium chloride flush (NS) 0.9 % injection 3 mL  3 mL Intravenous Q12H Opyd, Ilene Qua, MD   3 mL at 12/14/19 2200    Allergies as of 12/13/2019  . (No Known Allergies)    Family History  Problem Relation Age of Onset  . Lung disease Mother   . Hypertension Father   . Hyperlipidemia Father   . Bowel Disease Sister   . Breast cancer Sister   . Thyroid cancer Sister     Social History   Socioeconomic History  . Marital status: Single    Spouse name: Not on file  . Number of children: Not on file  . Years of education: Not on file  . Highest education level: Not on file  Occupational History  . Not on file  Tobacco Use  . Smoking status: Never Smoker  . Smokeless tobacco: Never Used  Substance and Sexual Activity  . Alcohol use: Yes    Comment: occasional glass of wine  . Drug use: Not on file  . Sexual activity: Not on file  Other Topics Concern  . Not on file  Social History Narrative  . Not on file   Social Determinants of Health   Financial Resource Strain:   . Difficulty of Paying Living Expenses: Not on file  Food Insecurity:   . Worried About Charity fundraiser in the Last Year: Not on file  . Ran Out of Food in the Last Year: Not on file  Transportation Needs:   . Lack of Transportation (Medical): Not on file  . Lack of Transportation (Non-Medical): Not on file  Physical Activity:   . Days of Exercise per Week: Not on file  . Minutes of Exercise per Session: Not on file  Stress:   . Feeling of Stress : Not on file  Social Connections:   . Frequency of Communication with Friends and Family: Not on file  . Frequency of Social Gatherings  with Friends and Family: Not on file  . Attends Religious Services: Not on file  . Active Member of Clubs or Organizations: Not on file  . Attends Archivist Meetings: Not on file  . Marital Status: Not on file  Intimate Partner Violence:   . Fear of Current or Ex-Partner: Not on file  . Emotionally Abused: Not on file  . Physically Abused: Not on file  . Sexually Abused: Not on file    Review of Systems: Positive for: GI: Described in detail in HPI.    Gen: Denies any fever, chills, rigors, night sweats, anorexia, fatigue, weakness, malaise, involuntary weight loss, and sleep disorder CV: Denies chest pain, angina, palpitations, syncope, orthopnea, PND, peripheral  edema, and claudication. Resp: Denies dyspnea, cough, sputum, wheezing, coughing up blood. GU : Denies urinary burning, blood in urine, urinary frequency, urinary hesitancy, nocturnal urination, and urinary incontinence. MS: Denies joint pain or swelling.  Denies muscle weakness, cramps, atrophy.  Derm: Denies rash, itching, oral ulcerations, hives, unhealing ulcers.  Psych: Denies depression, anxiety, memory loss, suicidal ideation, hallucinations,  and confusion. Heme: Denies bruising, bleeding, and enlarged lymph nodes. Neuro:  Denies any headaches, dizziness, paresthesias. Endo:  DM, Denies any problems with thyroid, adrenal function.  Physical Exam: Vital signs in last 24 hours: Temp:  [98.5 F (36.9 C)-99.5 F (37.5 C)] 99.3 F (37.4 C) (01/17 0408) Pulse Rate:  [93-100] 93 (01/17 0408) Resp:  [18] 18 (01/17 0408) BP: (99-133)/(56-70) 117/66 (01/17 0408) SpO2:  [94 %-100 %] 94 % (01/17 0408) Last BM Date: 12/14/19  General:   Alert,  Well-developed, well-nourished, appears upset and was crying Head:  Normocephalic and atraumatic. Eyes:  Sclera clear, no icterus.   Conjunctiva pink. Ears:  Normal auditory acuity. Nose:  No deformity, discharge,  or lesions. Mouth:  No deformity or lesions.   Oropharynx pink & moist. Neck:  Supple; no masses or thyromegaly. Lungs:  Clear throughout to auscultation.   No wheezes, crackles, or rhonchi. No acute distress. Heart:  Regular rate and rhythm; no murmurs, clicks, rubs,  or gallops. Extremities:  Without clubbing or edema. Neurologic:  Alert and  oriented x4;  grossly normal neurologically. Skin:  Intact without significant lesions or rashes. Psych:  Alert and cooperative.  Appears anxious and was crying. Abdomen:  Soft, bilateral lower abdominal tenderness and nondistended. No masses, hepatosplenomegaly or hernias noted. Normal bowel sounds, without guarding, and without rebound.         Lab Results: Recent Labs    12/13/19 1646 12/14/19 0516 12/15/19 0547  WBC 14.4* 10.3 9.4  HGB 9.8* 7.9* 7.7*  HCT 31.9* 24.9* 24.5*  PLT 559* 451* 446*   BMET Recent Labs    12/13/19 1646 12/14/19 0516 12/15/19 0547  NA 139 140 146*  K 4.2 3.0* 4.1  CL 112* 115* 115*  CO2 10* 13* 21*  GLUCOSE 173* 90 109*  BUN 99* 78* 41*  CREATININE 3.10* 1.88* 1.22*  CALCIUM 9.2 7.1* 7.8*   LFT Recent Labs    12/15/19 0547  PROT 5.2*  ALBUMIN 2.1*  AST 30  ALT 30  ALKPHOS 68  BILITOT 0.5   PT/INR No results for input(s): LABPROT, INR in the last 72 hours.  Studies/Results: CT ABDOMEN PELVIS WO CONTRAST  Result Date: 12/13/2019 CLINICAL DATA:  Diverticulitis suspected EXAM: CT ABDOMEN AND PELVIS WITHOUT CONTRAST TECHNIQUE: Multidetector CT imaging of the abdomen and pelvis was performed following the standard protocol without IV contrast. COMPARISON:  None FINDINGS: Lower chest: The basilar atelectatic changes. Lung bases otherwise clear. Normal heart size. No pericardial effusion. Small fat containing Morgagni hernia. Hepatobiliary: No focal liver abnormality is seen. No gallstones, gallbladder wall thickening, or biliary dilatation. Pancreas: There is partial fatty replacement of the pancreas. Some edematous changes in the pancreatic  head are noted with faint peripancreatic inflammation. No pancreatic ductal dilatation is seen. Spleen: Normal in size without focal abnormality. Adrenals/Urinary Tract: Adrenal glands are unremarkable. Kidneys are normal, without renal calculi, focal lesion, or hydronephrosis. Bladder is unremarkable. Stomach/Bowel: Stomach is distended with ingested enteric contrast media. Duodenum takes a normal course. No small bowel dilatation or wall thickening. A normal appendix is visualized. Proximal colon has a normal appearance aside from some  mild tortuosity of the transverse colon displaced inferiorly into the lower abdomen. There is extensive distal colonic diverticulosis and segmental thickening of the distal colon with pericolonic inflammatory change, possible culprit diverticulum in the left lower quadrant (5/62). Adjacent phlegmonous change without free fluid or air. No organized abscess or collection. Vascular/Lymphatic: Atherosclerotic plaque within the normal caliber aorta. No pathologically enlarged nodes in the abdomen or pelvis. Few reactive low mesenteric and upper abdominal nodes. Reproductive: Slightly retroverted uterus. No concerning adnexal lesions. Other: Phlegmonous change in the left lower quadrant adjacent the thickened sigmoid. No free fluid. No free air. No organized collection or abscess. No bowel containing hernia. Musculoskeletal: Dextrocurvature of the lumbar spine with an apex at L2. Multilevel degenerative changes present throughout the imaged spine and both hips. No worrisome osseous lesions are seen. IMPRESSION: 1. Extensive distal colonic diverticulosis and segmental thickening of the distal colon with pericolonic inflammatory change, possible culprit diverticulum in the left lower quadrant. Adjacent phlegmonous change without free fluid or air. No organized collection or abscess. Findings could reflect an acute diverticulitis versus segmental colitis associated with diverticulosis  (SCAD). Patient should undergo colonoscopic evaluation following resolution of acute symptoms to exclude underlying mass. 2. Edematous changes of the partially fatty replaced pancreatic parenchyma with adjacent peripancreatic stranding. Recommend correlation with lipase as features could reflect an acute edematous interstitial pancreatitis. No visible calcified gallstones or dilatation of the biliary tree. 3. Small fat containing Morgagni hernia. 4. Multilevel degenerative changes throughout the imaged spine and both hips. 5.  Aortic Atherosclerosis (ICD10-I70.0). These results were called by telephone at the time of interpretation on 12/13/2019 at 10:12 pm to provider [facility] , who verbally acknowledged these results. Electronically Signed   By: Kreg Shropshire M.D.   On: 12/13/2019 22:12    Impression: Extensive colonic diverticulosis with segmental thickening of distal colon with pericolonic inflammatory change Acute diverticulitis versus segmental colitis associated with diverticulosis  Edematous change of partially fatty replaced pancreatic parenchyma with adjacent peripancreatic stranding compatible with acute edematous interstitial pancreatitis, no history of heavy alcohol use, no gallstones or biliary dilatation noted on CAT scan.  Normal liver enzymes, T bili 0.5/AST 30/ALT 30/ALP 68  Normocytic anemia, hemoglobin 7.7, MCV 82.5, FOBT negative ,elevated platelet likely reactive thrombocytosis  Renal impairment BUN 99/creatinine 3.1/GFR 15, improved to 41/1.22/46, on normal saline at 100 cc an hour  SARS Coronavirus 2 NEGATIVE      Plan: Improving leukocytosis on IV ceftriaxone and IV Flagyl Started on full liquid diet. We will start Metamucil twice daily. She will need a diagnostic colonoscopy in 6 to 8 weeks as an outpatient with Dr. Ewing Schlein. We will follow. Discussed the same with patient's son [place] Kleen.   LOS: 2 days   Kerin Salen, MD  12/15/2019, 11:36 AM

## 2019-12-16 LAB — COMPREHENSIVE METABOLIC PANEL
ALT: 25 U/L (ref 0–44)
AST: 19 U/L (ref 15–41)
Albumin: 2.3 g/dL — ABNORMAL LOW (ref 3.5–5.0)
Alkaline Phosphatase: 66 U/L (ref 38–126)
Anion gap: 11 (ref 5–15)
BUN: 23 mg/dL (ref 8–23)
CO2: 19 mmol/L — ABNORMAL LOW (ref 22–32)
Calcium: 8 mg/dL — ABNORMAL LOW (ref 8.9–10.3)
Chloride: 115 mmol/L — ABNORMAL HIGH (ref 98–111)
Creatinine, Ser: 1.14 mg/dL — ABNORMAL HIGH (ref 0.44–1.00)
GFR calc Af Amer: 58 mL/min — ABNORMAL LOW (ref >60)
GFR calc non Af Amer: 50 mL/min — ABNORMAL LOW (ref >60)
Glucose, Bld: 97 mg/dL (ref 70–99)
Potassium: 3.6 mmol/L (ref 3.5–5.1)
Sodium: 145 mmol/L (ref 135–145)
Total Bilirubin: 1.1 mg/dL (ref 0.3–1.2)
Total Protein: 5.4 g/dL — ABNORMAL LOW (ref 6.5–8.1)

## 2019-12-16 LAB — RETICULOCYTES
Immature Retic Fract: 20.6 % — ABNORMAL HIGH (ref 2.3–15.9)
RBC.: 3.03 MIL/uL — ABNORMAL LOW (ref 3.87–5.11)
Retic Count, Absolute: 26.7 10*3/uL (ref 19.0–186.0)
Retic Ct Pct: 0.9 % (ref 0.4–3.1)

## 2019-12-16 LAB — GLUCOSE, CAPILLARY
Glucose-Capillary: 90 mg/dL (ref 70–99)
Glucose-Capillary: 100 mg/dL — ABNORMAL HIGH (ref 70–99)
Glucose-Capillary: 122 mg/dL — ABNORMAL HIGH (ref 70–99)
Glucose-Capillary: 110 mg/dL — ABNORMAL HIGH (ref 70–99)
Glucose-Capillary: 106 mg/dL — ABNORMAL HIGH (ref 70–99)

## 2019-12-16 LAB — IRON AND TIBC
Iron: 24 ug/dL — ABNORMAL LOW (ref 28–170)
Saturation Ratios: 14 % (ref 10.4–31.8)
TIBC: 173 ug/dL — ABNORMAL LOW (ref 250–450)
UIBC: 149 ug/dL

## 2019-12-16 LAB — CBC
HCT: 25.9 % — ABNORMAL LOW (ref 36.0–46.0)
Hemoglobin: 8 g/dL — ABNORMAL LOW (ref 12.0–15.0)
MCH: 26.1 pg (ref 26.0–34.0)
MCHC: 30.9 g/dL (ref 30.0–36.0)
MCV: 84.4 fL (ref 80.0–100.0)
Platelets: 439 10*3/uL — ABNORMAL HIGH (ref 150–400)
RBC: 3.07 MIL/uL — ABNORMAL LOW (ref 3.87–5.11)
RDW: 15.3 % (ref 11.5–15.5)
WBC: 12.6 10*3/uL — ABNORMAL HIGH (ref 4.0–10.5)
nRBC: 0 % (ref 0.0–0.2)

## 2019-12-16 LAB — VITAMIN B12: Vitamin B-12: 743 pg/mL (ref 180–914)

## 2019-12-16 LAB — FERRITIN: Ferritin: 279 ng/mL (ref 11–307)

## 2019-12-16 LAB — FOLATE: Folate: 36.1 ng/mL (ref >5.9)

## 2019-12-16 MED ORDER — ADULT MULTIVITAMIN W/MINERALS CH
1.0000 | ORAL_TABLET | Freq: Every day | ORAL | Status: DC
  Administered 2019-12-16 – 2019-12-17 (×2): 1 via ORAL
  Filled 2019-12-16 (×2): qty 1

## 2019-12-16 MED ORDER — AMLODIPINE BESYLATE 5 MG PO TABS
2.5000 mg | ORAL_TABLET | Freq: Every day | ORAL | Status: DC
  Administered 2019-12-16 – 2019-12-17 (×2): 2.5 mg via ORAL
  Filled 2019-12-16 (×2): qty 1

## 2019-12-16 NOTE — Progress Notes (Signed)
Initial Nutrition Assessment  RD working remotely.   DOCUMENTATION CODES:   Not applicable  INTERVENTION:  - continue Boost Breeze TID, each supplement provides 250 kcal and 9 grams of protein. - will order Magic Cup BID with meals, each supplement provides 290 kcal and 9 grams of protein. - will order daily multivitamin with minerals. - continue to encourage PO intakes.    NUTRITION DIAGNOSIS:   Inadequate oral intake related to acute illness as evidenced by per patient/family report.  GOAL:   Patient will meet greater than or equal to 90% of their needs  MONITOR:   PO intake, Supplement acceptance, Labs, Weight trends, I & O's  REASON FOR ASSESSMENT:   Malnutrition Screening Tool  ASSESSMENT:   69 y.o. female with medical history significant for HTN, type 2 DM, kidney disease, and GERD. She presented to the ED with abdominal pain, malaise, and changes in her bowel habits for several months. She reports mainly passing mucus and sometimes a small amount of blood. She has been experiencing abdominal pain. She has never had a colonoscopy.  Diet advanced from NPO to CLD on 1/15 at 2325, to FLD on 1/17 at 1050, and to Soft on 1/18 at 0850. No intakes documented since diet advancement. Patient reports not wanting breakfast or lunch today so she skipped those meals; has not had any solid food since admission (1/15).   She states that over the past 1 month she has been experiencing progressively worsening abdominal pain with associated nausea and that she did not throw up often but did have several episodes in the past 2-3 weeks.   During the past 1 month she was also feeling progressively weak and did not have the energy to prepare food. She was also experiencing decreased appetite d/t symptoms and would mainly consume items such as soup or snack foods.   She is unsure of any weight loss that may have occurred over the past 1 month. Weight on 1/16 was 120 lb and no other weight hx  is available in the chart.   Per notes: - pancreatitis/diverticulitis--CT abdomen/pelvis showed extensive distal colonic diverticulosis and segmental thickening of distal colon - AKI--improving - chronic anemia - hypokalemia--resolved   GI following and note from this AM states okay to d/c home today or tomorrow from GI standpoint.    Labs reviewed; CBGs: 90, 100, and 122 mg/dl, Cl: 413 mmol/l, creatinine: 1.14 mg/dl, Ca: 8 mg/dl, GFR: 50 ml/min. Medications reviewed; sliding scale novolog, 1 packet metamucil BID.    NUTRITION - FOCUSED PHYSICAL EXAM:  unable to complete at this time.   Diet Order:   Diet Order            DIET SOFT Room service appropriate? Yes; Fluid consistency: Thin  Diet effective now              EDUCATION NEEDS:   No education needs have been identified at this time  Skin:  Skin Assessment: Reviewed RN Assessment  Last BM:  PTA/unknown  Height:   Ht Readings from Last 1 Encounters:  12/14/19 5\' 3"  (1.6 m)    Weight:   Wt Readings from Last 1 Encounters:  12/14/19 54.4 kg    Ideal Body Weight:  52.3 kg  BMI:  Body mass index is 21.26 kg/m.  Estimated Nutritional Needs:   Kcal:  1630-1850 kcal  Protein:  70-80 grams  Fluid:  >/= 2 L/day     12/16/19, MS, RD, LDN, CNSC Inpatient Clinical Dietitian Pager #  [phone] After hours/weekend pager # [phone]

## 2019-12-16 NOTE — Progress Notes (Addendum)
PROGRESS NOTE    Sabrina Rodriguez  XHB:[id number] DOB: 1951/01/15 DOA: 12/13/2019 PCP: [name], MD    Brief Narrative:69 y.o.femalewith medical history significant forhypertension, type 2 diabetes mellitus, kidney disease, and GERD, now presenting to the emergency department with abdominal pain, malaise, and change in bowel habits. History is obtained from patient and also from her daughter by phone. Patient has been complaining of change in bowel habits for at least a month now, mainly just passing some mucus and sometimes a small amount of blood. She has had a loss of appetite associated with this, nausea, not much vomiting, and no diarrhea. The abdominal pain had been mainly in the lower quadrants initially, but now also in the upper abdomen. She has not noted any fevers. She is not sure about weight loss but reports not eating much for at least a month now. She has not noted any change in her urination. Patient reports a history of kidney disease, but is unable to provide much detail. Patient's daughter believes that patient is established with a nephrologist within the past couple months. She has never had a colonoscopy. Per patient's daughter, the patient has trouble with her hearing and also becomes quite anxious in the doctor's office or hospital.  ED Course:Upon arrival to the ED, patient is found to be afebrile, saturating well on room air, tachycardic in the 110s, and with stable blood pressure. Chemistry panel is notable for bicarbonate of 10, BUN 99, and creatinine 3.10. CBC with leukocytosis to 14,400, thrombocytosis, and normocytic anemia with hemoglobin 9.8. Lipase was elevated to 117. Fecal occult blood testing was negative. COVID-19 PCR screening test is in process. Type and screen was performed and the patient was given 2 L normal saline, Rocephin and Flagyl, morphine, and Zofran in the ED. Assessment & Plan:   Principal Problem:   Acute  diverticulitis Active Problems:   Renal failure   Metabolic acidosis   Diabetes mellitus type II, non insulin dependent (HCC)   Hypertension   Pancreatitis  #1 pancreatitis/diverticulitis-continue Rocephin and Flagyl. Lipase at the time of admission was 117 down to 49 today CT of the abdomen and pelvis shows Extensive distal colonic diverticulosis and segmental thickening of the distal colon with pericolonic inflammatory change, possible culprit diverticulum in the left lower quadrant. Adjacent phlegmonous change without free fluid or air. No organized collection or abscess. Findings could reflect an acute diverticulitis versus segmental colitis associated with diverticulosis (SCAD). Patient should undergo colonoscopic evaluation following resolution of acute symptoms to exclude underlying mass. Edematous changes of the partially fatty replaced pancreatic parenchyma with adjacent peripancreatic stranding. Recommend correlation with lipase as features could reflect an acute edematous interstitial pancreatitis. No visible calcified gallstones or dilatation of the biliary tree. Small fat containing Morgagni hernia. Multilevel degenerative changes throughout the imaged spine and both hips. Diet advanced if she tolerates well plan discharge home in the morning.  Continue Metamucil.  Follow-up with GI as an outpatient. Continue IV Rocephin and Flagyl.  #2 AKIimproving with IV hydration.  Creatinine down to 1.14 from 1.22 down from 3.1 at the time of admission.    3 type 2 diabetescontinue SSI.  Blood sugar stable 91, 90, 100, 122.  #4 essential hypertension blood pressure 144/77 will restart Norvasc 2.5 mg daily.   #5 chronic anemia-normocytic FOBT negative.  Hemoglobin 8.0.  Down from 9.8 likely secondary to hemodilution no evidence of active bleeding noted.  Her iron level is however low at 24.    #6 hypokalemia resolved  potassium 3.6   Estimated body mass index is  21.26 kg/m as calculated from the following:   Height as of this encounter: 5\' 3"  (1.6 m).   Weight as of this encounter: 54.4 kg.   DVT prophylaxis:Subcu heparin Code Status:Full code  family Communication:None  disposition Plan:Patient still with abdominal pain pancreatitis diverticulitis not able to tolerate any p.o. intake  Consultants:  None  Procedures:None Antimicrobials:  Subjective:  Resting in bed anxious to try something to eat  objective: Vitals:   12/15/19 0408 12/15/19 1411 12/15/19 2210 12/16/19 0416  BP: 117/66 131/71 (!) 155/79 129/66  Pulse: 93 93 (!) 102 82  Resp: 18 (!) 22 20 19   Temp: 99.3 F (37.4 C) 98.7 F (37.1 C) 99.8 F (37.7 C) 99.3 F (37.4 C)  TempSrc: Oral Oral Oral Oral  SpO2: 94% 94% 97% 97%  Weight:      Height:        Intake/Output Summary (Last 24 hours) at 12/16/2019 1210 Last data filed at 12/15/2019 1858 Gross per 24 hour  Intake --  Output 100 ml  Net -100 ml   Filed Weights   12/14/19 0722  Weight: 54.4 kg    Examination:  General exam: Appears calm and comfortable  Respiratory system: Clear to auscultation. Respiratory effort normal. Cardiovascular system: S1 & S2 heard, RRR. No JVD, murmurs, rubs, gallops or clicks. No pedal edema. Gastrointestinal system: Abdomen is nondistended, soft and left lower quadrant tender. No organomegaly or masses felt. Normal bowel sounds heard. Central nervous system: Alert and oriented. No focal neurological deficits. Extremities: Symmetric 5 x 5 power. Skin: No rashes, lesions or ulcers Psychiatry: Judgement and insight appear normal. Mood & affect appropriate.     Data Reviewed: I have personally reviewed following labs and imaging studies  CBC: Recent Labs  Lab 12/13/19 1646 12/14/19 0516 12/15/19 0547 12/16/19 0443  WBC 14.4* 10.3 9.4 12.6*  NEUTROABS  --  8.1*  --   --   HGB 9.8* 7.9* 7.7* 8.0*  HCT 31.9* 24.9* 24.5* 25.9*  MCV 83.7 82.7 82.5 84.4  PLT  559* 451* 446* 824*   Basic Metabolic Panel: Recent Labs  Lab 12/13/19 1646 12/14/19 0516 12/15/19 0547 12/16/19 0443  NA 139 140 146* 145  K 4.2 3.0* 4.1 3.6  CL 112* 115* 115* 115*  CO2 10* 13* 21* 19*  GLUCOSE 173* 90 109* 97  BUN 99* 78* 41* 23  CREATININE 3.10* 1.88* 1.22* 1.14*  CALCIUM 9.2 7.1* 7.8* 8.0*   GFR: Estimated Creatinine Clearance: 39.6 mL/min (A) (by C-G formula based on SCr of 1.14 mg/dL (H)). Liver Function Tests: Recent Labs  Lab 12/13/19 1646 12/14/19 0516 12/15/19 0547 12/16/19 0443  AST 39 28 30 19   ALT 31 26 30 25   ALKPHOS 100 69 68 66  BILITOT 0.9 0.8 0.5 1.1  PROT 7.6 5.4* 5.2* 5.4*  ALBUMIN 3.3* 2.2* 2.1* 2.3*   Recent Labs  Lab 12/13/19 2248 12/15/19 0547  LIPASE 117* 49   No results for input(s): AMMONIA in the last 168 hours. Coagulation Profile: No results for input(s): INR, PROTIME in the last 168 hours. Cardiac Enzymes: No results for input(s): CKTOTAL, CKMB, CKMBINDEX, TROPONINI in the last 168 hours. BNP (last 3 results) No results for input(s): PROBNP in the last 8760 hours. HbA1C: Recent Labs    12/14/19 0516  HGBA1C 6.5*   CBG: Recent Labs  Lab 12/15/19 2033 12/15/19 2357 12/16/19 0421 12/16/19 0754 12/16/19 1133  GLUCAP 96 91 90  100* 122*   Lipid Profile: No results for input(s): CHOL, HDL, LDLCALC, TRIG, CHOLHDL, LDLDIRECT in the last 72 hours. Thyroid Function Tests: No results for input(s): TSH, T4TOTAL, FREET4, T3FREE, THYROIDAB in the last 72 hours. Anemia Panel: Recent Labs    12/16/19 0443  VITAMINB12 743  FOLATE 36.1  FERRITIN 279  TIBC 173*  IRON 24*  RETICCTPCT 0.9   Sepsis Labs: No results for input(s): PROCALCITON, LATICACIDVEN in the last 168 hours.  Recent Results (from the past 240 hour(s))  SARS CORONAVIRUS 2 (TAT 6-24 HRS) Nasopharyngeal Nasopharyngeal Swab     Status: None   Collection Time: 12/13/19 11:33 PM   Specimen: Nasopharyngeal Swab  Result Value Ref Range Status    SARS Coronavirus 2 NEGATIVE NEGATIVE Final    Comment: (NOTE) SARS-CoV-2 target nucleic acids are NOT DETECTED. The SARS-CoV-2 RNA is generally detectable in upper and lower respiratory specimens during the acute phase of infection. Negative results do not preclude SARS-CoV-2 infection, do not rule out co-infections with other pathogens, and should not be used as the sole basis for treatment or other patient management decisions. Negative results must be combined with clinical observations, patient history, and epidemiological information. The expected result is Negative. Fact Sheet for Patients: HairSlick.no Fact Sheet for Healthcare Providers: quierodirigir.com This test is not yet approved or cleared by the [place] FDA and  has been authorized for detection and/or diagnosis of SARS-CoV-2 by FDA under an Emergency Use Authorization (EUA). This EUA will remain  in effect (meaning this test can be used) for the duration of the COVID-19 declaration under Section 56 4(b)(1) of the Act, 21 U.S.C. section 360bbb-3(b)(1), unless the authorization is terminated or revoked sooner. Performed at [facility], [address], [place], [place] [address]          Radiology Studies: No results found.      Scheduled Meds: . feeding supplement  1 Container Oral TID BM  . heparin  5,000 Units Subcutaneous Q8H  . insulin aspart  0-6 Units Subcutaneous Q4H  . psyllium  1 packet Oral BID  . sodium chloride flush  3 mL Intravenous Q12H   Continuous Infusions: . sodium chloride 100 mL/hr at 12/14/19 1600  . cefTRIAXone (ROCEPHIN)  IV 2 g (12/15/19 2230)  . metronidazole 500 mg (12/16/19 0534)     LOS: 3 days     Alwyn Ren, MD [facility]  If 7PM-7AM, please contact night-coverage [email or web address] Password [facility] 12/16/2019, 12:10 PM

## 2019-12-16 NOTE — Progress Notes (Signed)
[facility] Gastroenterology Progress Note  Sabrina Rodriguez 69 y.o. 09-May-1951  CC: Follow-up for diverticulitis   Subjective: She is feeling better today.  Abdominal pain has improved.  She wants to advance diet.  Denies nausea or vomiting.  ROS : Somewhat anxious.  Negative for chest pain and shortness of breath.   Objective: Vital signs in last 24 hours: Vitals:   12/15/19 2210 12/16/19 0416  BP: (!) 155/79 129/66  Pulse: (!) 102 82  Resp: 20 19  Temp: 99.8 F (37.7 C) 99.3 F (37.4 C)  SpO2: 97% 97%    Physical Exam:  General:  Alert, cooperative, no distress, appears stated age  Head:  Normocephalic, without obvious abnormality, atraumatic  Eyes:  , EOM's intact,   Lungs:   Clear to auscultation bilaterally, respirations unlabored  Heart:  Regular rate and rhythm, S1, S2 normal  Abdomen:   Soft, non-tender, nondistended, bowel sounds present, no peritoneal signs  Extremities: Extremities normal, atraumatic, no  edema       Lab Results: Recent Labs    12/15/19 0547 12/16/19 0443  NA 146* 145  K 4.1 3.6  CL 115* 115*  CO2 21* 19*  GLUCOSE 109* 97  BUN 41* 23  CREATININE 1.22* 1.14*  CALCIUM 7.8* 8.0*   Recent Labs    12/15/19 0547 12/16/19 0443  AST 30 19  ALT 30 25  ALKPHOS 68 66  BILITOT 0.5 1.1  PROT 5.2* 5.4*  ALBUMIN 2.1* 2.3*   Recent Labs    12/14/19 0516 12/14/19 0516 12/15/19 0547 12/16/19 0443  WBC 10.3   < > 9.4 12.6*  NEUTROABS 8.1*  --   --   --   HGB 7.9*   < > 7.7* 8.0*  HCT 24.9*   < > 24.5* 25.9*  MCV 82.7   < > 82.5 84.4  PLT 451*   < > 446* 439*   < > = values in this interval not displayed.   No results for input(s): LABPROT, INR in the last 72 hours.    Assessment/Plan: -Sigmoid diverticulitis versus segmental colitis associated with diverticulosis.  Improving with antibiotics. -Anemia.  Iron studies showed elevated ferritin, low iron and low TIBC.  Not consistent with iron deficiency anemia. -CT scan concerning  for mild pancreatitis.  Patient does not have any epigastric discomfort.  Normal lipase.  Recommendations ------------------------- -Advance diet to soft. -Okay to change antibiotics to oral ciprofloxacin and Flagyl.  Recommend antibiotics for 10 to 14 days. -Okay to discharge home from GI standpoint later today or tomorrow. -Recommend outpatient colonoscopy in 6 to 8 weeks to rule out underlying malignancy. -GI will follow if patient remains hospitalized.     Kathi Der MD, FACP 12/16/2019, 9:38 AM  Contact #  [phone]

## 2019-12-17 LAB — GLUCOSE, CAPILLARY
Glucose-Capillary: 120 mg/dL — ABNORMAL HIGH (ref 70–99)
Glucose-Capillary: 92 mg/dL (ref 70–99)
Glucose-Capillary: 98 mg/dL (ref 70–99)

## 2019-12-17 LAB — COMPREHENSIVE METABOLIC PANEL
ALT: 19 U/L (ref 0–44)
AST: 12 U/L — ABNORMAL LOW (ref 15–41)
Albumin: 2 g/dL — ABNORMAL LOW (ref 3.5–5.0)
Alkaline Phosphatase: 57 U/L (ref 38–126)
Anion gap: 7 (ref 5–15)
BUN: 14 mg/dL (ref 8–23)
CO2: 21 mmol/L — ABNORMAL LOW (ref 22–32)
Calcium: 7.7 mg/dL — ABNORMAL LOW (ref 8.9–10.3)
Chloride: 116 mmol/L — ABNORMAL HIGH (ref 98–111)
Creatinine, Ser: 0.95 mg/dL (ref 0.44–1.00)
GFR calc Af Amer: >60 mL/min (ref >60)
GFR calc non Af Amer: >60 mL/min (ref >60)
Glucose, Bld: 99 mg/dL (ref 70–99)
Potassium: 3.3 mmol/L — ABNORMAL LOW (ref 3.5–5.1)
Sodium: 144 mmol/L (ref 135–145)
Total Bilirubin: 0.4 mg/dL (ref 0.3–1.2)
Total Protein: 4.7 g/dL — ABNORMAL LOW (ref 6.5–8.1)

## 2019-12-17 LAB — CBC
HCT: 23.5 % — ABNORMAL LOW (ref 36.0–46.0)
Hemoglobin: 7.2 g/dL — ABNORMAL LOW (ref 12.0–15.0)
MCH: 25.7 pg — ABNORMAL LOW (ref 26.0–34.0)
MCHC: 30.6 g/dL (ref 30.0–36.0)
MCV: 83.9 fL (ref 80.0–100.0)
Platelets: 386 10*3/uL (ref 150–400)
RBC: 2.8 MIL/uL — ABNORMAL LOW (ref 3.87–5.11)
RDW: 15.1 % (ref 11.5–15.5)
WBC: 11.5 10*3/uL — ABNORMAL HIGH (ref 4.0–10.5)
nRBC: 0 % (ref 0.0–0.2)

## 2019-12-17 MED ORDER — PSYLLIUM 95 % PO PACK: 1.0000 | PACK | Freq: Two times a day (BID) | ORAL | Status: AC

## 2019-12-17 MED ORDER — CIPROFLOXACIN HCL 500 MG PO TABS: 500.0000 mg | ORAL_TABLET | Freq: Two times a day (BID) | ORAL | 0 refills | Status: AC

## 2019-12-17 MED ORDER — POTASSIUM CHLORIDE CRYS ER 20 MEQ PO TBCR
40.0000 meq | EXTENDED_RELEASE_TABLET | Freq: Once | ORAL | Status: AC
  Administered 2019-12-17: 10:00:00 40 meq via ORAL
  Filled 2019-12-17: qty 2

## 2019-12-17 MED ORDER — METRONIDAZOLE 500 MG PO TABS: 500.0000 mg | ORAL_TABLET | Freq: Three times a day (TID) | ORAL | 0 refills | Status: AC

## 2019-12-17 MED ORDER — IRON (FERROUS SULFATE) 325 (65 FE) MG PO TABS: 325.0000 mg | ORAL_TABLET | Freq: Two times a day (BID) | ORAL | Status: AC

## 2019-12-17 NOTE — Progress Notes (Signed)
Pt has been discharged and is awaiting transportation by her son. Pt refused to have noon CBG and sliding insulin.

## 2019-12-17 NOTE — Discharge Summary (Signed)
Physician Discharge Summary  Sabrina Rodriguez FXT:[id number] DOB: 1951-04-15 DOA: 12/13/2019  PCP: Carol Ada, MD  Admit date: 12/13/2019 Discharge date: 12/17/2019  Admitted From: Home Disposition: Home Recommendations for Outpatient Follow-up:  1. Follow up with PCP in 1-2 weeks 2. Please obtain BMP/CBC in one week Please follow up with Dr. Watt Climes Home Health: None Equipment/Devices: None Discharge Condition: Stable and improved CODE STATUS: Full code Diet recommendation: Cardiac Brief/Interim Summary:69 y.o.femalewith medical history significant forhypertension, type 2 diabetes mellitus, kidney disease, and GERD, now presenting to the emergency department with abdominal pain, malaise, and change in bowel habits. History is obtained from patient and also from her daughter by phone. Patient has been complaining of change in bowel habits for at least a month now, mainly just passing some mucus and sometimes a small amount of blood. She has had a loss of appetite associated with this, nausea, not much vomiting, and no diarrhea. The abdominal pain had been mainly in the lower quadrants initially, but now also in the upper abdomen. She has not noted any fevers. She is not sure about weight loss but reports not eating much for at least a month now. She has not noted any change in her urination. Patient reports a history of kidney disease, but is unable to provide much detail. Patient's daughter believes that patient is established with a nephrologist within the past couple months. She has never had a colonoscopy. Per patient's daughter, the patient has trouble with her hearing and also becomes quite anxious in the doctor's office or hospital.  ED Course:Upon arrival to the ED, patient is found to be afebrile, saturating well on room air, tachycardic in the 110s, and with stable blood pressure. Chemistry panel is notable for bicarbonate of 10, BUN 99, and creatinine 3.10. CBC with  leukocytosis to 14,400, thrombocytosis, and normocytic anemia with hemoglobin 9.8. Lipase was elevated to 117. Fecal occult blood testing was negative. COVID-19 PCR screening test is in process. Type and screen was performed and the patient was given 2 L normal saline, Rocephin and Flagyl, morphine, and Zofran in the ED.   Discharge Diagnoses:  Principal Problem:   Acute diverticulitis Active Problems:   Renal failure   Metabolic acidosis   Diabetes mellitus type II, non insulin dependent (HCC)   Hypertension   Pancreatitis  #1 pancreatitis/diverticulitis-she was treated with IV fluids Rocephin and Flagyl.  She was kept n.p.o. initially and then she tolerated diet prior to discharge.  Lipase at the time of admission was 117 down to 49 prior to discharge. CT of the abdomen and pelvis shows Extensive distal colonic diverticulosis and segmental thickening of the distal colon with pericolonic inflammatory change, possible culprit diverticulum in the left lower quadrant. Adjacent phlegmonous change without free fluid or air. No organized collection or abscess. Findings could reflect an acute diverticulitis versus segmental colitis associated with diverticulosis (SCAD). Patient should undergo colonoscopic evaluation following resolution of acute symptoms to exclude underlying mass. Edematous changes of the partially fatty replaced pancreatic parenchyma with adjacent peripancreatic stranding. Recommend correlation with lipase as features could reflect an acute edematous interstitial pancreatitis. No visible calcified gallstones or dilatation of the biliary tree. Small fat containing Morgagni hernia. Multilevel degenerative changes throughout the imaged spine and both hips.  We will plan for discharge today on Cipro and Flagyl for 10 days.  Continue Metamucil.  She will follow up with Dr. Driscilla Grammes in 4 weeks for colonoscopy.  #2 AKI resolved with hydration creatinine 0.95 at the  time  of discharge down from 1.22 at the time of admission.  3 type 2 diabetes continue Metformin.   #4 essential hypertension -continue Norvasc 2.5 mg daily.   #5 chronic anemia-normocytic FOBT negative.  Hemoglobin was 7.2 on discharge this is partly secondary to hemodilution.  Will need to recheck this in 2 weeks.  Her iron level is also low at 24 she will need p.o. iron replacement.   #6 hypokalemiaresolved potassium 3.3 which was replaced.      Nutrition Problem: Inadequate oral intake Etiology: acute illness    Signs/Symptoms: per patient/family report     Interventions: Boost Breeze, Magic cup, MVI  Estimated body mass index is 21.26 kg/m as calculated from the following:   Height as of this encounter: 5\' 3"  (1.6 m).   Weight as of this encounter: 54.4 kg.  Discharge Instructions  Discharge Instructions    Call MD for:  persistant dizziness or light-headedness   Complete by: As directed    Call MD for:  persistant nausea and vomiting   Complete by: As directed    Call MD for:  temperature >100.4   Complete by: As directed    Diet - low sodium heart healthy   Complete by: As directed    Increase activity slowly   Complete by: As directed      Allergies as of 12/17/2019   No Known Allergies     Medication List    TAKE these medications   amLODipine 2.5 MG tablet Commonly known as: NORVASC Take 2.5 mg by mouth daily.   ciprofloxacin 500 MG tablet Commonly known as: Cipro Take 1 tablet (500 mg total) by mouth 2 (two) times daily for 10 days.   DULoxetine 30 MG capsule Commonly known as: CYMBALTA Take 90 mg by mouth daily.   lisinopril 20 MG tablet Commonly known as: ZESTRIL Take 20 mg by mouth daily.   metFORMIN 500 MG tablet Commonly known as: GLUCOPHAGE Take 500 mg by mouth at bedtime.   metroNIDAZOLE 500 MG tablet Commonly known as: Flagyl Take 1 tablet (500 mg total) by mouth 3 (three) times daily for 10 days.   omeprazole 40 MG  capsule Commonly known as: PRILOSEC Take 40 mg by mouth daily.   psyllium 95 % Pack Commonly known as: HYDROCIL/METAMUCIL Take 1 packet by mouth 2 (two) times daily.   rosuvastatin 20 MG tablet Commonly known as: CRESTOR Take 20 mg by mouth daily.   tiZANidine 4 MG tablet Commonly known as: ZANAFLEX Take 4 mg by mouth every 8 (eight) hours as needed for muscle spasms.      Follow-up Information    12/19/2019, MD. Schedule an appointment as soon as possible for a visit in 4 week(s).   Specialty: Gastroenterology Why: Follow-up for diverticulitis.  Needs outpatient colonoscopy Contact information: [address] [place] [phone]        [phone], MD Follow up.   Specialty: Family Medicine Contact information: [phone] [address] [place] [phone]          No Known Allergies  Consultations: [facility]  Procedures/Studies: CT ABDOMEN PELVIS WO CONTRAST  Result Date: 12/13/2019 CLINICAL DATA:  Diverticulitis suspected EXAM: CT ABDOMEN AND PELVIS WITHOUT CONTRAST TECHNIQUE: Multidetector CT imaging of the abdomen and pelvis was performed following the standard protocol without IV contrast. COMPARISON:  None FINDINGS: Lower chest: The basilar atelectatic changes. Lung bases otherwise clear. Normal heart size. No pericardial effusion. Small fat containing  Morgagni hernia. Hepatobiliary: No focal liver abnormality is seen. No gallstones, gallbladder wall thickening, or biliary dilatation. Pancreas: There is partial fatty replacement of the pancreas. Some edematous changes in the pancreatic head are noted with faint peripancreatic inflammation. No pancreatic ductal dilatation is seen. Spleen: Normal in size without focal abnormality. Adrenals/Urinary Tract: Adrenal glands are unremarkable. Kidneys are normal, without renal calculi, focal lesion, or hydronephrosis. Bladder is unremarkable. Stomach/Bowel: Stomach is  distended with ingested enteric contrast media. Duodenum takes a normal course. No small bowel dilatation or wall thickening. A normal appendix is visualized. Proximal colon has a normal appearance aside from some mild tortuosity of the transverse colon displaced inferiorly into the lower abdomen. There is extensive distal colonic diverticulosis and segmental thickening of the distal colon with pericolonic inflammatory change, possible culprit diverticulum in the left lower quadrant (5/62). Adjacent phlegmonous change without free fluid or air. No organized abscess or collection. Vascular/Lymphatic: Atherosclerotic plaque within the normal caliber aorta. No pathologically enlarged nodes in the abdomen or pelvis. Few reactive low mesenteric and upper abdominal nodes. Reproductive: Slightly retroverted uterus. No concerning adnexal lesions. Other: Phlegmonous change in the left lower quadrant adjacent the thickened sigmoid. No free fluid. No free air. No organized collection or abscess. No bowel containing hernia. Musculoskeletal: Dextrocurvature of the lumbar spine with an apex at L2. Multilevel degenerative changes present throughout the imaged spine and both hips. No worrisome osseous lesions are seen. IMPRESSION: 1. Extensive distal colonic diverticulosis and segmental thickening of the distal colon with pericolonic inflammatory change, possible culprit diverticulum in the left lower quadrant. Adjacent phlegmonous change without free fluid or air. No organized collection or abscess. Findings could reflect an acute diverticulitis versus segmental colitis associated with diverticulosis (SCAD). Patient should undergo colonoscopic evaluation following resolution of acute symptoms to exclude underlying mass. 2. Edematous changes of the partially fatty replaced pancreatic parenchyma with adjacent peripancreatic stranding. Recommend correlation with lipase as features could reflect an acute edematous interstitial  pancreatitis. No visible calcified gallstones or dilatation of the biliary tree. 3. Small fat containing Morgagni hernia. 4. Multilevel degenerative changes throughout the imaged spine and both hips. 5.  Aortic Atherosclerosis (ICD10-I70.0). These results were called by telephone at the time of interpretation on 12/13/2019 at 10:12 pm to provider [facility] , who verbally acknowledged these results. Electronically Signed   By: Kreg ShropshirePrice  DeHay M.D.   On: 12/13/2019 22:12    (Echo, Carotid, EGD, Colonoscopy, ERCP)    Subjective: Awake alert in no distress anxious to go home  Discharge Exam: Vitals:   12/16/19 2014 12/17/19 0525  BP: 139/73 132/71  Pulse: 77 72  Resp: 18 17  Temp: 99.2 F (37.3 C) 99.1 F (37.3 C)  SpO2: 99% 97%   Vitals:   12/16/19 0416 12/16/19 1510 12/16/19 2014 12/17/19 0525  BP: 129/66 (!) 144/77 139/73 132/71  Pulse: 82 83 77 72  Resp: 19 20 18 17   Temp: 99.3 F (37.4 C) 98.2 F (36.8 C) 99.2 F (37.3 C) 99.1 F (37.3 C)  TempSrc: Oral  Oral Oral  SpO2: 97% 97% 99% 97%  Weight:      Height:        General: Pt is alert, awake, not in acute distress Cardiovascular: RRR, S1/S2 +, no rubs, no gallops Respiratory: CTA bilaterally, no wheezing, no rhonchi Abdominal: Soft, NT, ND, bowel sounds + Extremities: no edema, no cyanosis    The results of significant diagnostics from this hospitalization (including imaging, microbiology, ancillary and laboratory) are listed  below for reference.     Microbiology: Recent Results (from the past 240 hour(s))  SARS CORONAVIRUS 2 (TAT 6-24 HRS) Nasopharyngeal Nasopharyngeal Swab     Status: None   Collection Time: 12/13/19 11:33 PM   Specimen: Nasopharyngeal Swab  Result Value Ref Range Status   SARS Coronavirus 2 NEGATIVE NEGATIVE Final    Comment: (NOTE) SARS-CoV-2 target nucleic acids are NOT DETECTED. The SARS-CoV-2 RNA is generally detectable in upper and lower respiratory specimens during the acute phase of  infection. Negative results do not preclude SARS-CoV-2 infection, do not rule out co-infections with other pathogens, and should not be used as the sole basis for treatment or other patient management decisions. Negative results must be combined with clinical observations, patient history, and epidemiological information. The expected result is Negative. Fact Sheet for Patients: HairSlick.no Fact Sheet for Healthcare Providers: quierodirigir.com This test is not yet approved or cleared by the [place] FDA and  has been authorized for detection and/or diagnosis of SARS-CoV-2 by FDA under an Emergency Use Authorization (EUA). This EUA will remain  in effect (meaning this test can be used) for the duration of the COVID-19 declaration under Section 56 4(b)(1) of the Act, 21 U.S.C. section 360bbb-3(b)(1), unless the authorization is terminated or revoked sooner. Performed at [facility], [address], [place], [place] [address]      Labs: BNP (last 3 results) No results for input(s): BNP in the last 8760 hours. Basic Metabolic Panel: Recent Labs  Lab 12/13/19 1646 12/14/19 0516 12/15/19 0547 12/16/19 0443 12/17/19 0504  NA 139 140 146* 145 144  K 4.2 3.0* 4.1 3.6 3.3*  CL 112* 115* 115* 115* 116*  CO2 10* 13* 21* 19* 21*  GLUCOSE 173* 90 109* 97 99  BUN 99* 78* 41* 23 14  CREATININE 3.10* 1.88* 1.22* 1.14* 0.95  CALCIUM 9.2 7.1* 7.8* 8.0* 7.7*   Liver Function Tests: Recent Labs  Lab 12/13/19 1646 12/14/19 0516 12/15/19 0547 12/16/19 0443 12/17/19 0504  AST 39 28 30 19  12*  ALT 31 26 30 25 19   ALKPHOS 100 69 68 66 57  BILITOT 0.9 0.8 0.5 1.1 0.4  PROT 7.6 5.4* 5.2* 5.4* 4.7*  ALBUMIN 3.3* 2.2* 2.1* 2.3* 2.0*   Recent Labs  Lab 12/13/19 2248 12/15/19 0547  LIPASE 117* 49   No results for input(s): AMMONIA in the last 168 hours. CBC: Recent Labs  Lab 12/13/19 1646 12/14/19 0516  12/15/19 0547 12/16/19 0443 12/17/19 0504  WBC 14.4* 10.3 9.4 12.6* 11.5*  NEUTROABS  --  8.1*  --   --   --   HGB 9.8* 7.9* 7.7* 8.0* 7.2*  HCT 31.9* 24.9* 24.5* 25.9* 23.5*  MCV 83.7 82.7 82.5 84.4 83.9  PLT 559* 451* 446* 439* 386   Cardiac Enzymes: No results for input(s): CKTOTAL, CKMB, CKMBINDEX, TROPONINI in the last 168 hours. BNP: Invalid input(s): POCBNP CBG: Recent Labs  Lab 12/16/19 1646 12/16/19 2037 12/17/19 0006 12/17/19 0420 12/17/19 0800  GLUCAP 110* 106* 120* 92 98   D-Dimer No results for input(s): DDIMER in the last 72 hours. Hgb A1c No results for input(s): HGBA1C in the last 72 hours. Lipid Profile No results for input(s): CHOL, HDL, LDLCALC, TRIG, CHOLHDL, LDLDIRECT in the last 72 hours. Thyroid function studies No results for input(s): TSH, T4TOTAL, T3FREE, THYROIDAB in the last 72 hours.  Invalid input(s): FREET3 Anemia work up Recent Labs    12/16/19 0443  VITAMINB12 743  FOLATE 36.1  FERRITIN 279  TIBC 173*  IRON 24*  RETICCTPCT 0.9   Urinalysis    Component Value Date/Time   COLORURINE YELLOW 12/14/2019 0021   APPEARANCEUR CLEAR 12/14/2019 0021   LABSPEC 1.011 12/14/2019 0021   PHURINE 5.0 12/14/2019 0021   GLUCOSEU NEGATIVE 12/14/2019 0021   HGBUR NEGATIVE 12/14/2019 0021   BILIRUBINUR NEGATIVE 12/14/2019 0021   KETONESUR 5 (A) 12/14/2019 0021   PROTEINUR 30 (A) 12/14/2019 0021   NITRITE NEGATIVE 12/14/2019 0021   LEUKOCYTESUR SMALL (A) 12/14/2019 0021   Sepsis Labs Invalid input(s): PROCALCITONIN,  WBC,  LACTICIDVEN Microbiology Recent Results (from the past 240 hour(s))  SARS CORONAVIRUS 2 (TAT 6-24 HRS) Nasopharyngeal Nasopharyngeal Swab     Status: None   Collection Time: 12/13/19 11:33 PM   Specimen: Nasopharyngeal Swab  Result Value Ref Range Status   SARS Coronavirus 2 NEGATIVE NEGATIVE Final    Comment: (NOTE) SARS-CoV-2 target nucleic acids are NOT DETECTED. The SARS-CoV-2 RNA is generally detectable in  upper and lower respiratory specimens during the acute phase of infection. Negative results do not preclude SARS-CoV-2 infection, do not rule out co-infections with other pathogens, and should not be used as the sole basis for treatment or other patient management decisions. Negative results must be combined with clinical observations, patient history, and epidemiological information. The expected result is Negative. Fact Sheet for Patients: HairSlick.no Fact Sheet for Healthcare Providers: quierodirigir.com This test is not yet approved or cleared by the [place] FDA and  has been authorized for detection and/or diagnosis of SARS-CoV-2 by FDA under an Emergency Use Authorization (EUA). This EUA will remain  in effect (meaning this test can be used) for the duration of the COVID-19 declaration under Section 56 4(b)(1) of the Act, 21 U.S.C. section 360bbb-3(b)(1), unless the authorization is terminated or revoked sooner. Performed at [facility], [address], [place], [place] [address]      Time coordinating discharge: 37 minutes  SIGNED:   Alwyn Ren, MD  [facility] 12/17/2019, 10:13 AM Pager   If 7PM-7AM, please contact night-coverage [email or web address] Password [email or web address]

## 2019-12-17 NOTE — Progress Notes (Signed)
[facility] Gastroenterology Progress Note  Sabrina Rodriguez 69 y.o. 1951-10-22  CC: Follow-up for diverticulitis   Subjective: Seen and examined at bedside.  No acute issues since yesterday.  Continues to do better.  Had good bowel movement yesterday.  Denies any blood in the stool or black stool.  Denies abdominal pain, nausea or vomiting.  ROS : Somewhat anxious.  Negative for chest pain and shortness of breath.   Objective: Vital signs in last 24 hours: Vitals:   12/16/19 2014 12/17/19 0525  BP: 139/73 132/71  Pulse: 77 72  Resp: 18 17  Temp: 99.2 F (37.3 C) 99.1 F (37.3 C)  SpO2: 99% 97%    Physical Exam:  General:  Alert, cooperative, no distress, appears stated age  Head:  Normocephalic, without obvious abnormality, atraumatic  Eyes:  , EOM's intact,   Lungs:   Clear to auscultation bilaterally, respirations unlabored  Heart:  Regular rate and rhythm, S1, S2 normal  Abdomen:   Soft, non-tender, nondistended, bowel sounds present, no peritoneal signs  Extremities: Extremities normal, atraumatic, no  edema       Lab Results: Recent Labs    12/16/19 0443 12/17/19 0504  NA 145 144  K 3.6 3.3*  CL 115* 116*  CO2 19* 21*  GLUCOSE 97 99  BUN 23 14  CREATININE 1.14* 0.95  CALCIUM 8.0* 7.7*   Recent Labs    12/16/19 0443 12/17/19 0504  AST 19 12*  ALT 25 19  ALKPHOS 66 57  BILITOT 1.1 0.4  PROT 5.4* 4.7*  ALBUMIN 2.3* 2.0*   Recent Labs    12/16/19 0443 12/17/19 0504  WBC 12.6* 11.5*  HGB 8.0* 7.2*  HCT 25.9* 23.5*  MCV 84.4 83.9  PLT 439* 386   No results for input(s): LABPROT, INR in the last 72 hours.    Assessment/Plan: -Sigmoid diverticulitis versus segmental colitis associated with diverticulosis.  Improving with antibiotics. -Anemia.  Iron studies showed elevated ferritin, low iron and low TIBC.  Not consistent with iron deficiency anemia. -CT scan concerning for mild pancreatitis.  Patient does not have any epigastric discomfort.   Normal lipase.  Recommendations ------------------------- -Okay to change antibiotics to oral ciprofloxacin and Flagyl.  Recommend antibiotics for 10 to 14 days. -Okay to discharge home from GI standpoint  -Recommend outpatient colonoscopy in 6 to 8 weeks to rule out underlying malignancy. -GI will sign off.  Call us back if needed    Kathi Der MD, FACP 12/17/2019, 8:54 AM  Contact #  [phone]

## 2019-12-17 NOTE — Progress Notes (Signed)
Pt is being discharged home. Discharge instructions including medications and follow up appointments given. Pt had no further questions at this time. 

## 2019-12-17 NOTE — Care Management Important Message (Signed)
Important Message  Patient Details IM Letter given to Daryel Gerald SW Case Manager to present to the Patient Name: Sabrina Rodriguez MRN: [id number] Date of Birth: [birth year]   Medicare Important Message Given:  Yes     Caren Macadam 12/17/2019, 12:42 PM

## 2020-01-12 ENCOUNTER — Encounter (HOSPITAL_COMMUNITY): Payer: Self-pay | Admitting: Emergency Medicine

## 2020-01-12 ENCOUNTER — Inpatient Hospital Stay (HOSPITAL_COMMUNITY)
Admission: EM | Admit: 2020-01-12 | Discharge: 2020-01-17 | DRG: 682 | Disposition: A | Discharge status: Home Health | Payer: Medicare Other | Attending: Internal Medicine | Admitting: Internal Medicine

## 2020-01-12 ENCOUNTER — Emergency Department (HOSPITAL_COMMUNITY): Payer: Medicare Other

## 2020-01-12 ENCOUNTER — Other Ambulatory Visit: Payer: Self-pay

## 2020-01-12 DIAGNOSIS — D62 Acute posthemorrhagic anemia: Secondary | ICD-10-CM | POA: Diagnosis present

## 2020-01-12 DIAGNOSIS — Z20822 Contact with and (suspected) exposure to covid-19: Secondary | ICD-10-CM | POA: Diagnosis present

## 2020-01-12 DIAGNOSIS — I472 Ventricular tachycardia: Secondary | ICD-10-CM | POA: Diagnosis present

## 2020-01-12 DIAGNOSIS — Z8349 Family history of other endocrine, nutritional and metabolic diseases: Secondary | ICD-10-CM | POA: Diagnosis not present

## 2020-01-12 DIAGNOSIS — E119 Type 2 diabetes mellitus without complications: Secondary | ICD-10-CM

## 2020-01-12 DIAGNOSIS — I471 Supraventricular tachycardia: Secondary | ICD-10-CM | POA: Diagnosis present

## 2020-01-12 DIAGNOSIS — Z681 Body mass index (BMI) 19 or less, adult: Secondary | ICD-10-CM | POA: Diagnosis not present

## 2020-01-12 DIAGNOSIS — E43 Unspecified severe protein-calorie malnutrition: Secondary | ICD-10-CM | POA: Diagnosis present

## 2020-01-12 DIAGNOSIS — K5792 Diverticulitis of intestine, part unspecified, without perforation or abscess without bleeding: Secondary | ICD-10-CM | POA: Diagnosis not present

## 2020-01-12 DIAGNOSIS — K5732 Diverticulitis of large intestine without perforation or abscess without bleeding: Secondary | ICD-10-CM | POA: Diagnosis present

## 2020-01-12 DIAGNOSIS — Z803 Family history of malignant neoplasm of breast: Secondary | ICD-10-CM

## 2020-01-12 DIAGNOSIS — N179 Acute kidney failure, unspecified: Principal | ICD-10-CM

## 2020-01-12 DIAGNOSIS — Z7984 Long term (current) use of oral hypoglycemic drugs: Secondary | ICD-10-CM | POA: Diagnosis not present

## 2020-01-12 DIAGNOSIS — Z808 Family history of malignant neoplasm of other organs or systems: Secondary | ICD-10-CM | POA: Diagnosis not present

## 2020-01-12 DIAGNOSIS — D649 Anemia, unspecified: Secondary | ICD-10-CM | POA: Diagnosis present

## 2020-01-12 DIAGNOSIS — Z8249 Family history of ischemic heart disease and other diseases of the circulatory system: Secondary | ICD-10-CM

## 2020-01-12 DIAGNOSIS — E872 Acidosis: Secondary | ICD-10-CM | POA: Diagnosis present

## 2020-01-12 DIAGNOSIS — I10 Essential (primary) hypertension: Secondary | ICD-10-CM | POA: Diagnosis present

## 2020-01-12 DIAGNOSIS — R410 Disorientation, unspecified: Secondary | ICD-10-CM | POA: Diagnosis not present

## 2020-01-12 DIAGNOSIS — E876 Hypokalemia: Secondary | ICD-10-CM | POA: Diagnosis not present

## 2020-01-12 DIAGNOSIS — E860 Dehydration: Secondary | ICD-10-CM | POA: Diagnosis present

## 2020-01-12 DIAGNOSIS — E785 Hyperlipidemia, unspecified: Secondary | ICD-10-CM | POA: Diagnosis present

## 2020-01-12 HISTORY — DX: Diverticulitis of intestine, part unspecified, without perforation or abscess without bleeding: K57.92

## 2020-01-12 LAB — MAGNESIUM: Magnesium: 2 mg/dL (ref 1.7–2.4)

## 2020-01-12 LAB — COMPREHENSIVE METABOLIC PANEL
ALT: 13 U/L (ref 0–44)
AST: 17 U/L (ref 15–41)
Albumin: 2.4 g/dL — ABNORMAL LOW (ref 3.5–5.0)
Alkaline Phosphatase: 118 U/L (ref 38–126)
Anion gap: 21 — ABNORMAL HIGH (ref 5–15)
BUN: 41 mg/dL — ABNORMAL HIGH (ref 8–23)
CO2: 7 mmol/L — ABNORMAL LOW (ref 22–32)
Calcium: 9 mg/dL (ref 8.9–10.3)
Chloride: 116 mmol/L — ABNORMAL HIGH (ref 98–111)
Creatinine, Ser: 1.71 mg/dL — ABNORMAL HIGH (ref 0.44–1.00)
GFR calc Af Amer: 35 mL/min — ABNORMAL LOW (ref >60)
GFR calc non Af Amer: 30 mL/min — ABNORMAL LOW (ref >60)
Glucose, Bld: 110 mg/dL — ABNORMAL HIGH (ref 70–99)
Potassium: 4 mmol/L (ref 3.5–5.1)
Sodium: 144 mmol/L (ref 135–145)
Total Bilirubin: 1 mg/dL (ref 0.3–1.2)
Total Protein: 6.4 g/dL — ABNORMAL LOW (ref 6.5–8.1)

## 2020-01-12 LAB — POCT I-STAT EG7
Acid-base deficit: 21 mmol/L — ABNORMAL HIGH (ref 0.0–2.0)
Bicarbonate: 7.1 mmol/L — ABNORMAL LOW (ref 20.0–28.0)
Calcium, Ion: 1.34 mmol/L (ref 1.15–1.40)
HCT: 29 % — ABNORMAL LOW (ref 36.0–46.0)
Hemoglobin: 9.9 g/dL — ABNORMAL LOW (ref 12.0–15.0)
O2 Saturation: 89 %
Potassium: 4 mmol/L (ref 3.5–5.1)
Sodium: 145 mmol/L (ref 135–145)
TCO2: 8 mmol/L — ABNORMAL LOW (ref 22–32)
pCO2, Ven: 22.2 mmHg — ABNORMAL LOW (ref 44.0–60.0)
pH, Ven: 7.115 — CL (ref 7.250–7.430)
pO2, Ven: 75 mmHg — ABNORMAL HIGH (ref 32.0–45.0)

## 2020-01-12 LAB — CBC WITH DIFFERENTIAL/PLATELET
Abs Immature Granulocytes: 0.09 10*3/uL — ABNORMAL HIGH (ref 0.00–0.07)
Basophils Absolute: 0 10*3/uL (ref 0.0–0.1)
Basophils Relative: 0 %
Eosinophils Absolute: 0.1 10*3/uL (ref 0.0–0.5)
Eosinophils Relative: 0 %
HCT: 31.2 % — ABNORMAL LOW (ref 36.0–46.0)
Hemoglobin: 9.4 g/dL — ABNORMAL LOW (ref 12.0–15.0)
Immature Granulocytes: 1 %
Lymphocytes Relative: 13 %
Lymphs Abs: 1.8 10*3/uL (ref 0.7–4.0)
MCH: 25.7 pg — ABNORMAL LOW (ref 26.0–34.0)
MCHC: 30.1 g/dL (ref 30.0–36.0)
MCV: 85.2 fL (ref 80.0–100.0)
Monocytes Absolute: 0.9 10*3/uL (ref 0.1–1.0)
Monocytes Relative: 7 %
Neutro Abs: 11.4 10*3/uL — ABNORMAL HIGH (ref 1.7–7.7)
Neutrophils Relative %: 79 %
Platelets: 716 10*3/uL — ABNORMAL HIGH (ref 150–400)
RBC: 3.66 MIL/uL — ABNORMAL LOW (ref 3.87–5.11)
RDW: 18.3 % — ABNORMAL HIGH (ref 11.5–15.5)
WBC: 14.3 10*3/uL — ABNORMAL HIGH (ref 4.0–10.5)
nRBC: 0.1 % (ref 0.0–0.2)

## 2020-01-12 LAB — BRAIN NATRIURETIC PEPTIDE: B Natriuretic Peptide: 138.5 pg/mL — ABNORMAL HIGH (ref 0.0–100.0)

## 2020-01-12 LAB — RESPIRATORY PANEL BY RT PCR (FLU A&B, COVID)
Influenza A by PCR: NEGATIVE
Influenza B by PCR: NEGATIVE
SARS Coronavirus 2 by RT PCR: NEGATIVE

## 2020-01-12 LAB — PROTIME-INR
INR: 1.3 — ABNORMAL HIGH (ref 0.8–1.2)
Prothrombin Time: 15.6 seconds — ABNORMAL HIGH (ref 11.4–15.2)

## 2020-01-12 LAB — TSH: TSH: 4.92 u[IU]/mL — ABNORMAL HIGH (ref 0.350–4.500)

## 2020-01-12 LAB — LIPASE, BLOOD: Lipase: 68 U/L — ABNORMAL HIGH (ref 11–51)

## 2020-01-12 LAB — TROPONIN I (HIGH SENSITIVITY)
Troponin I (High Sensitivity): 6 ng/L (ref <18)
Troponin I (High Sensitivity): 5 ng/L (ref <18)

## 2020-01-12 LAB — AMMONIA: Ammonia: 17 umol/L (ref 9–35)

## 2020-01-12 LAB — CBG MONITORING, ED: Glucose-Capillary: 101 mg/dL — ABNORMAL HIGH (ref 70–99)

## 2020-01-12 LAB — GLUCOSE, CAPILLARY: Glucose-Capillary: 92 mg/dL (ref 70–99)

## 2020-01-12 LAB — LACTIC ACID, PLASMA
Lactic Acid, Venous: 0.9 mmol/L (ref 0.5–1.9)
Lactic Acid, Venous: 0.6 mmol/L (ref 0.5–1.9)

## 2020-01-12 LAB — PHOSPHORUS: Phosphorus: 4.4 mg/dL (ref 2.5–4.6)

## 2020-01-12 IMAGING — CT CT ABD-PELV W/O CM
2 of 4 series · 16 of 46 positions shown, 18 images · non-contrast
Comparison: [DATE]

CLINICAL DATA: Abdominal and back pain

EXAM:
CT ABDOMEN AND PELVIS WITHOUT CONTRAST
TECHNIQUE: Multidetector CT imaging of the abdomen and pelvis was performed
following the standard protocol without IV contrast.

[Series 3: abd/ pelvis 5.0 i30f 2 · axial · 0.70mm/px · z∈[-459,-54]mm · 13 of 91 slices shown, 15 images]
[im 5/91  soft-tissue]
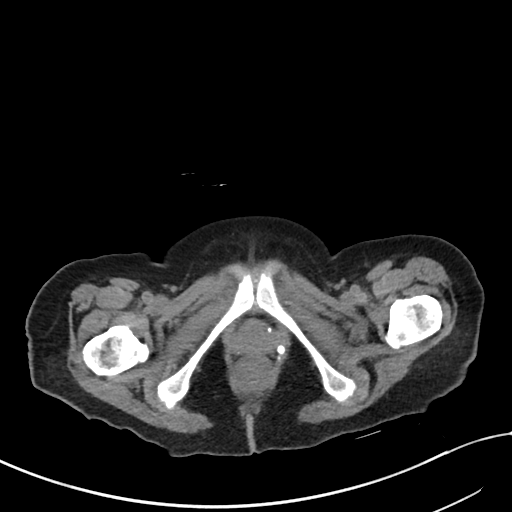
[im 5/91  bone]
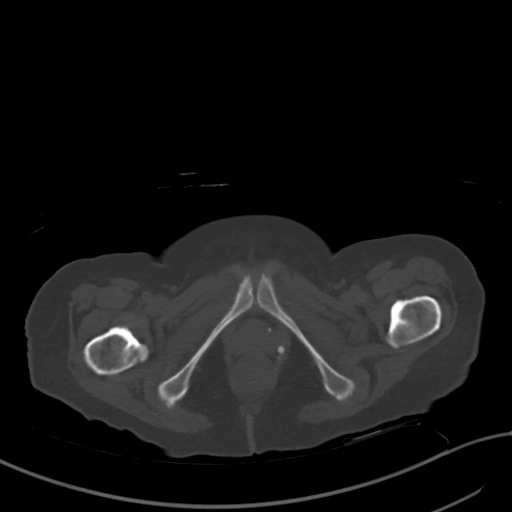
[im 13/91  soft-tissue]
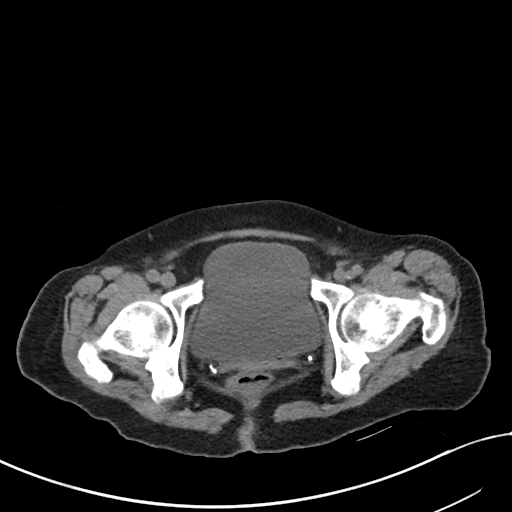
[im 21/91  soft-tissue]
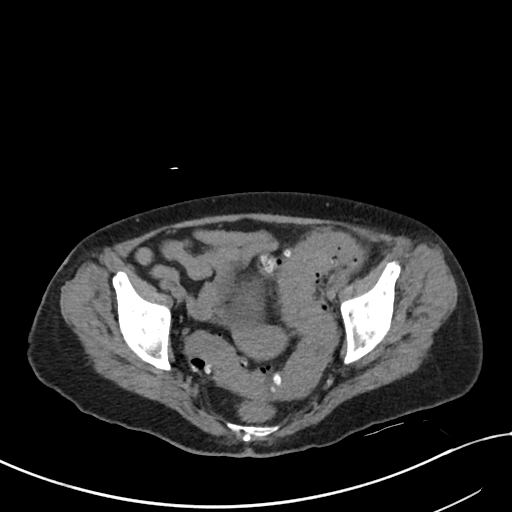
[im 25/91  soft-tissue]
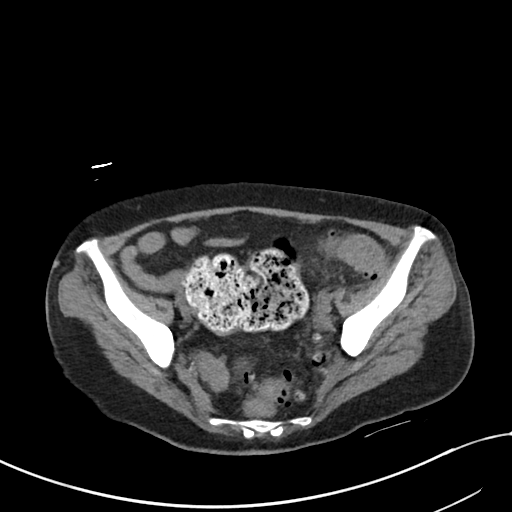
[im 33/91  soft-tissue]
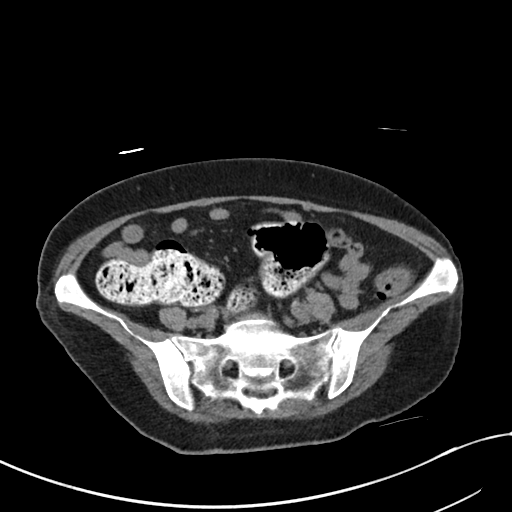
[im 37/91  soft-tissue]
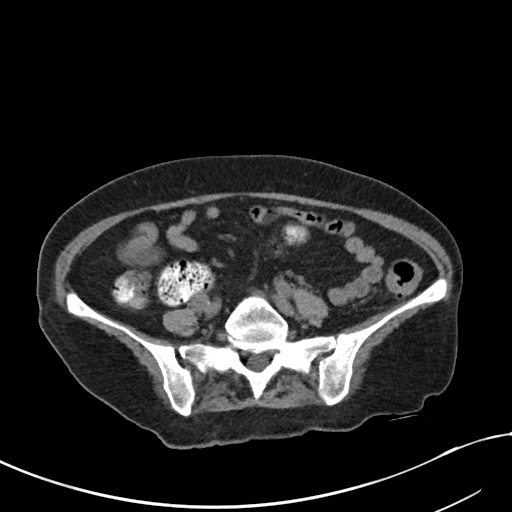
[im 46/91  soft-tissue]
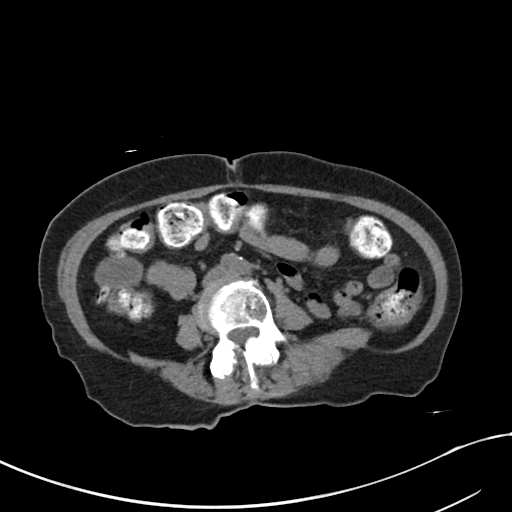
[im 54/91  soft-tissue]
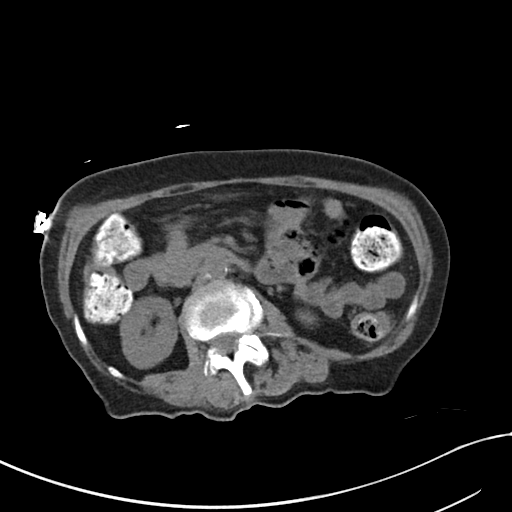
[im 58/91  soft-tissue]
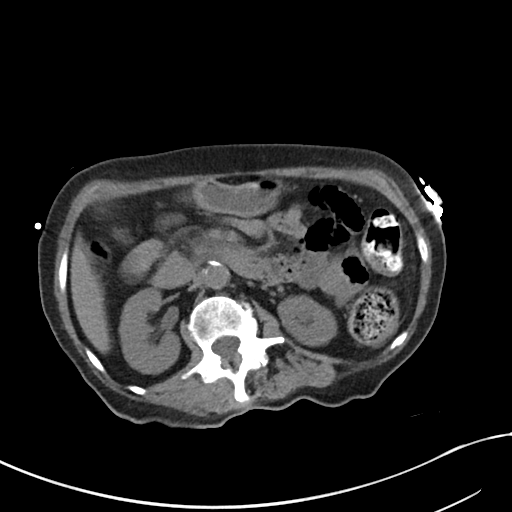
[im 58/91  bone]
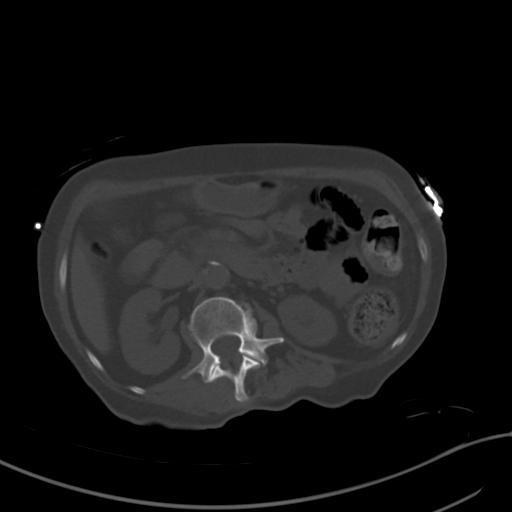
[im 66/91  soft-tissue]
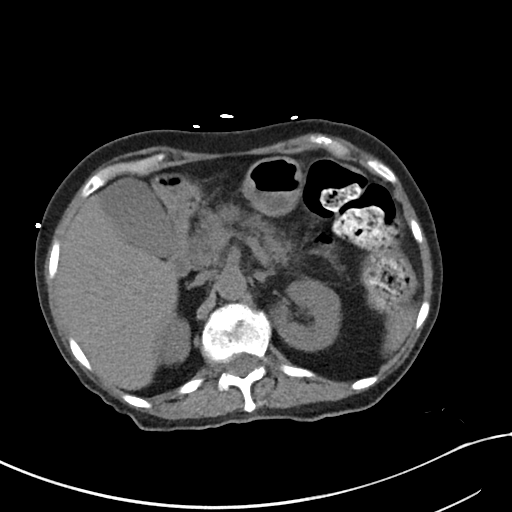
[im 70/91  soft-tissue]
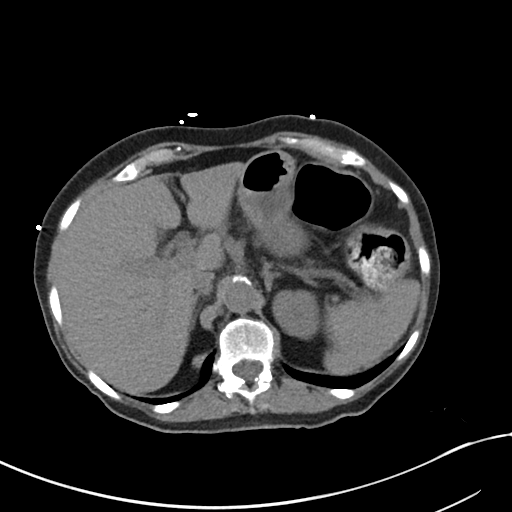
[im 78/91  soft-tissue]
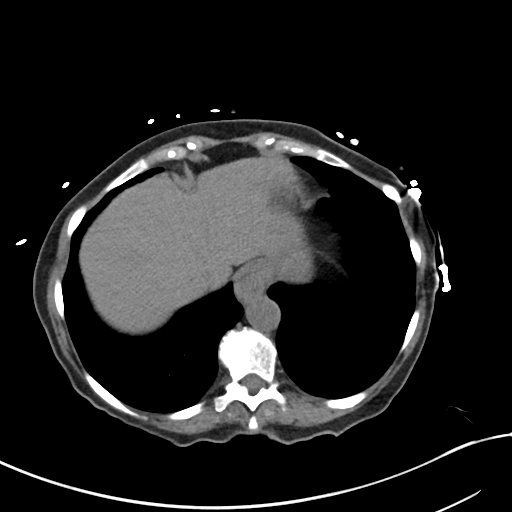
[im 86/91  soft-tissue]
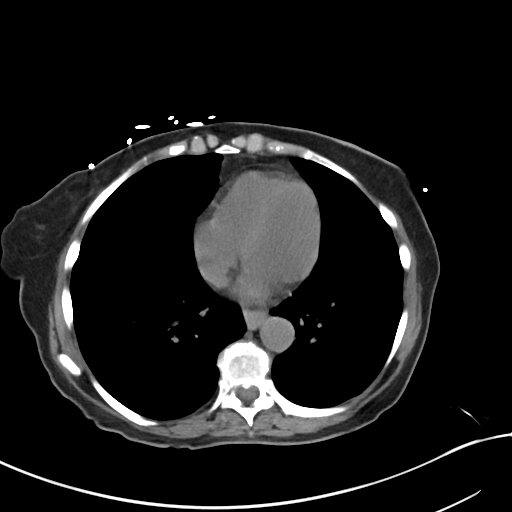

[Series 6: cor st · coronal · 0.66mm/px · 3 of 83 slices shown]
[im 28/83  soft-tissue]
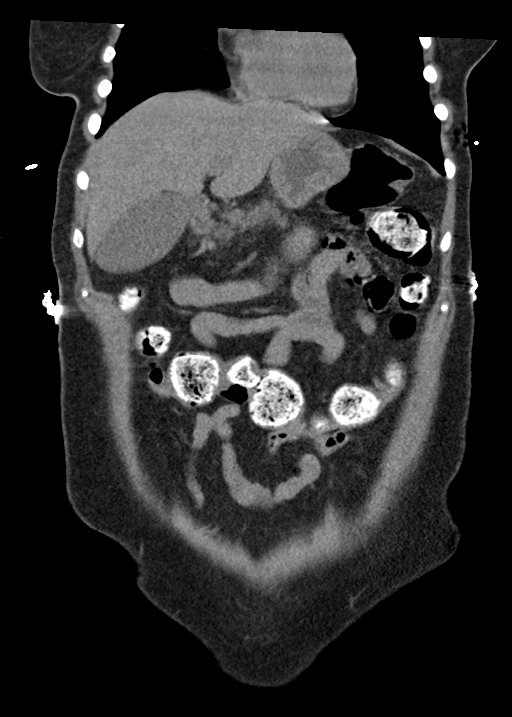
[im 37/83  soft-tissue]
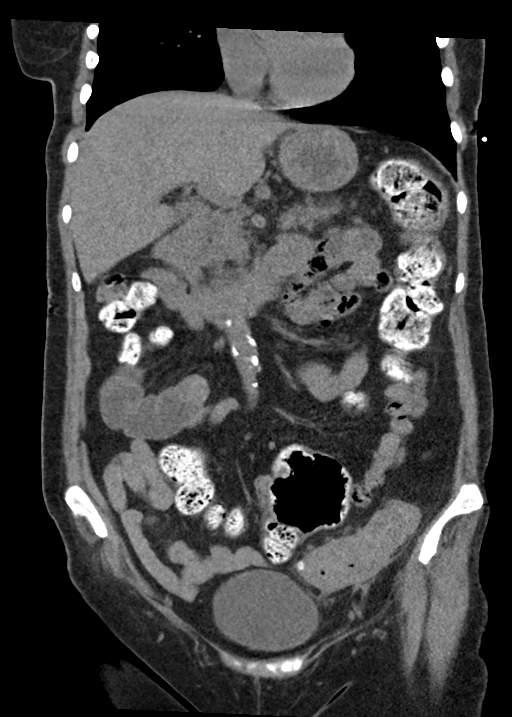
[im 46/83  soft-tissue]
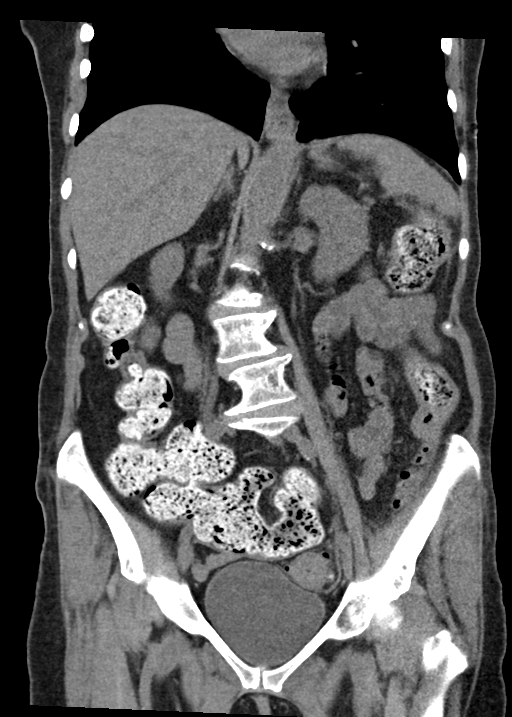

[16 of 46 positions shown; findings below may reference images not displayed]

FINDINGS: Lower chest: No acute pleural or parenchymal lung disease.

Hepatobiliary: Gallbladder is moderately distended with small
calcified gallstone. No CT evidence of cholecystitis. The liver is
unremarkable.

Pancreas: Unremarkable. No pancreatic ductal dilatation or
surrounding inflammatory changes.

Spleen: Normal in size without focal abnormality.

Adrenals/Urinary Tract: No urinary tract calculi or obstructive
uropathy. Bladder is unremarkable. The adrenals are normal.

Stomach/Bowel: No bowel obstruction or ileus. The cecum is midline,
with a normal appendix identified. Moderate retained stool.

There is wall thickening of the distal descending and sigmoid colon
with mild pericolonic fat stranding, consistent with acute
uncomplicated diverticulitis. No perforation, fluid collection, or
abscess.

Vascular/Lymphatic: Aortic atherosclerosis. No enlarged abdominal or
pelvic lymph nodes.

Reproductive: Uterus and bilateral adnexa are unremarkable.

Other: No abdominal wall hernia or abnormality. No abdominopelvic
ascites.

Musculoskeletal: No acute or destructive bony lesions. Rotatory
scoliosis of the thoracolumbar spine, with multilevel spondylosis.
Reconstructed images demonstrate no additional findings.
IMPRESSION: 1. Acute uncomplicated diverticulitis of the distal descending and
sigmoid colon. No perforation, fluid collection, or abscess.
2. Cholelithiasis without cholecystitis.

## 2020-01-12 IMAGING — DX DG CHEST 1V PORT
1 series · 1 of 1 positions shown · non-contrast
Comparison: None.

CLINICAL DATA: Confusion

EXAM:
PORTABLE CHEST 1 VIEW

[chest ap]
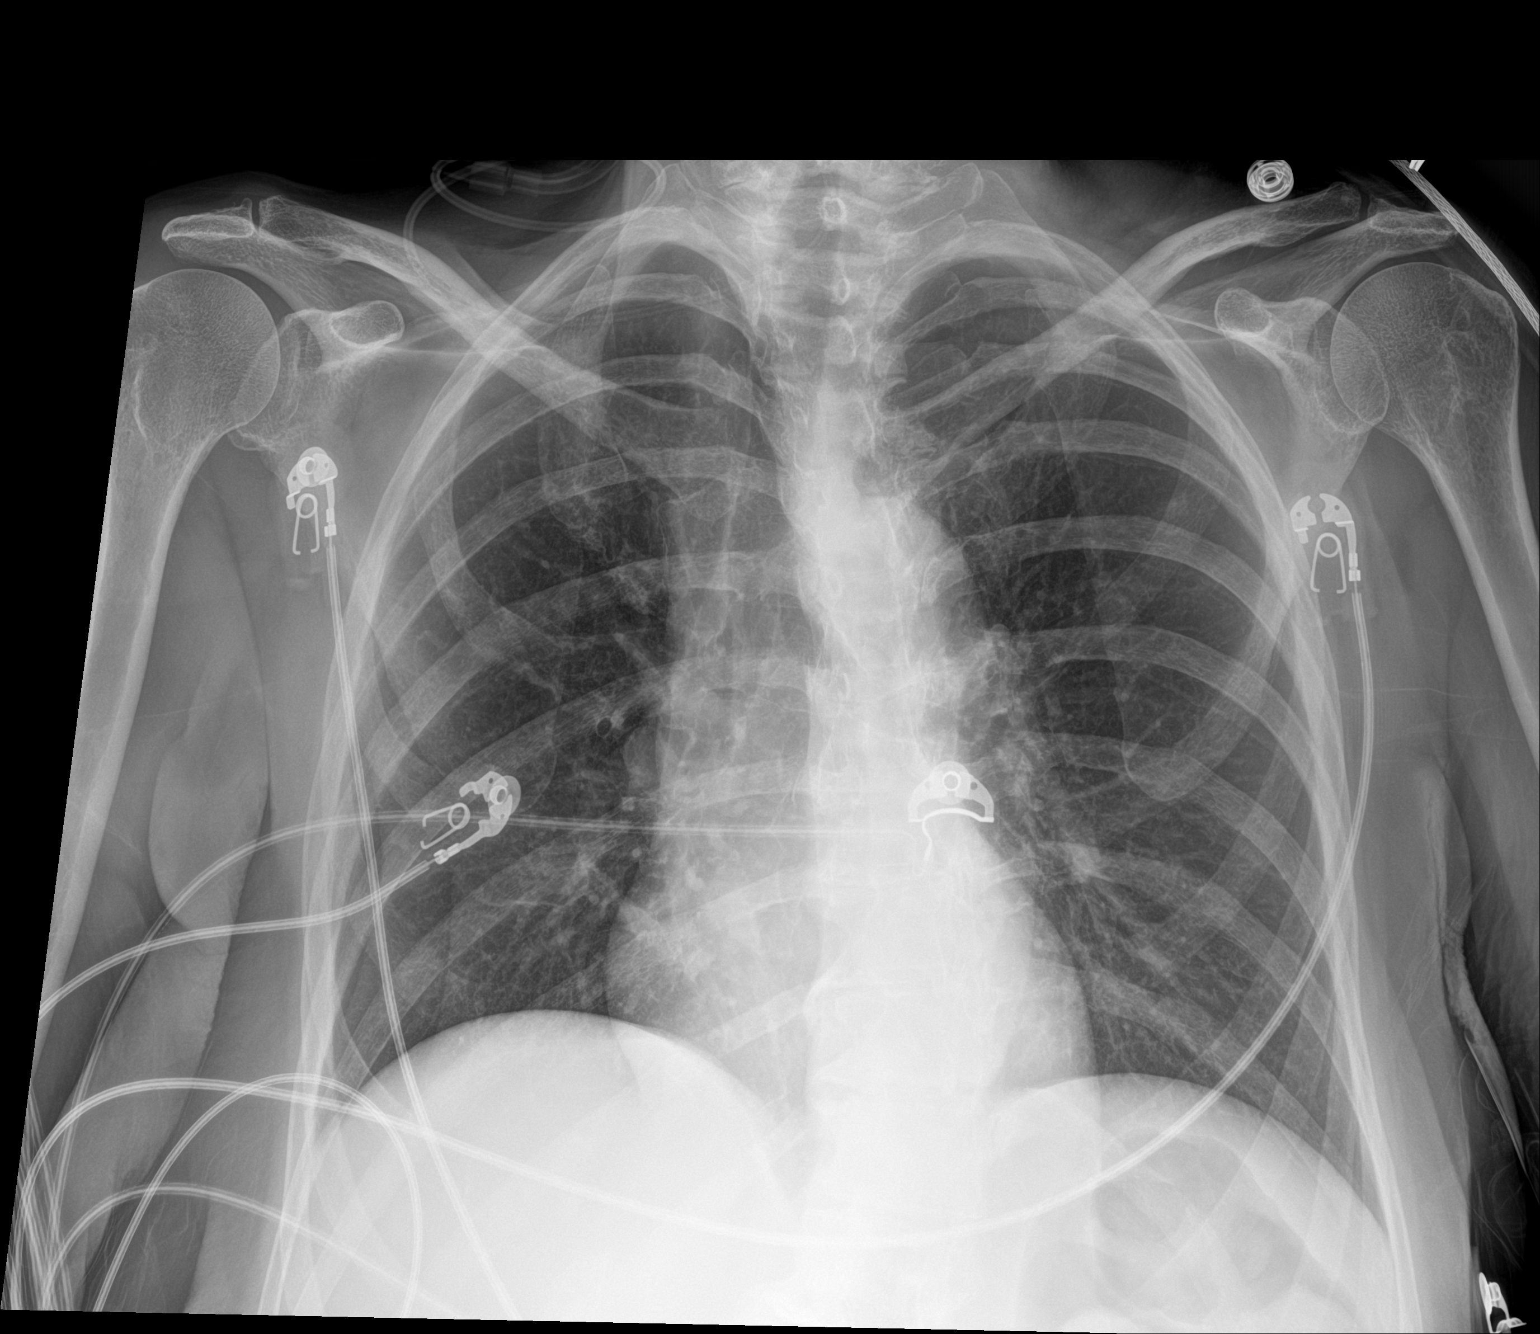

[1 of 1 positions shown; findings below may reference images not displayed]

FINDINGS: The heart size and mediastinal contours are within normal limits.
Both lungs are clear. The visualized skeletal structures are
unremarkable.
IMPRESSION: No active disease.

## 2020-01-12 IMAGING — CT CT HEAD W/O CM
4 series · 17 of 47 positions shown, 19 images · non-contrast
Comparison: None.

CLINICAL DATA: Altered mental status. History of diabetes and
hypertension.

EXAM:
CT HEAD WITHOUT CONTRAST
TECHNIQUE: Contiguous axial images were obtained from the base of the skull
through the vertex without intravenous contrast.

[Series 3: head without · axial · non-contrast · 0.41mm/px · z∈[-124,-24]mm · 6 of 29 slices shown, 8 images]
[im 5/29  brain]
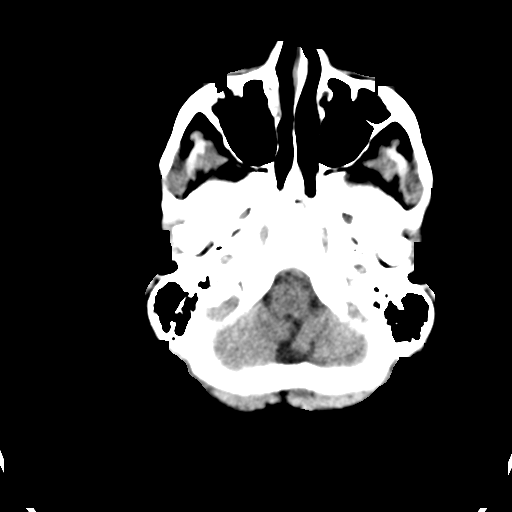
[im 5/29  bone]
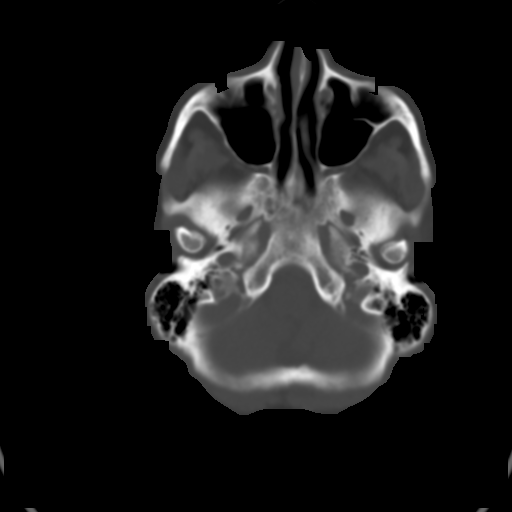
[im 9/29  brain]
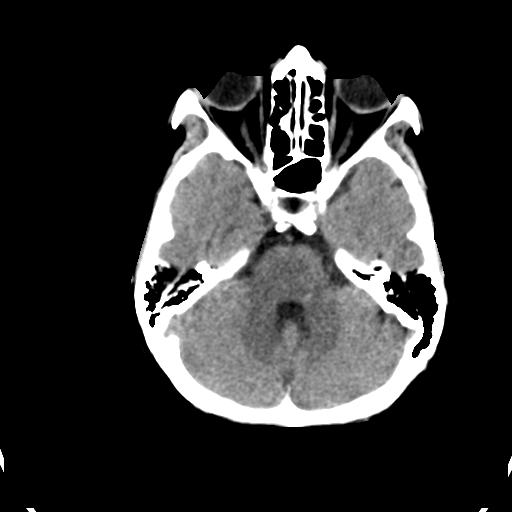
[im 13/29  brain]
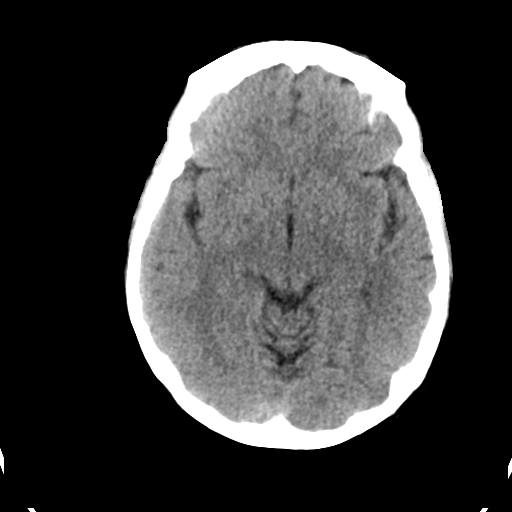
[im 17/29  brain]
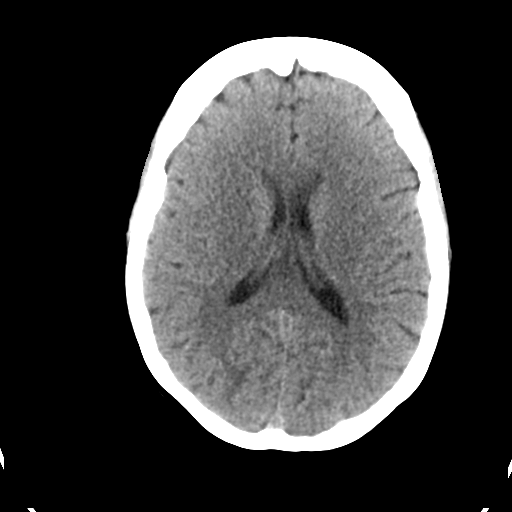
[im 21/29  brain]
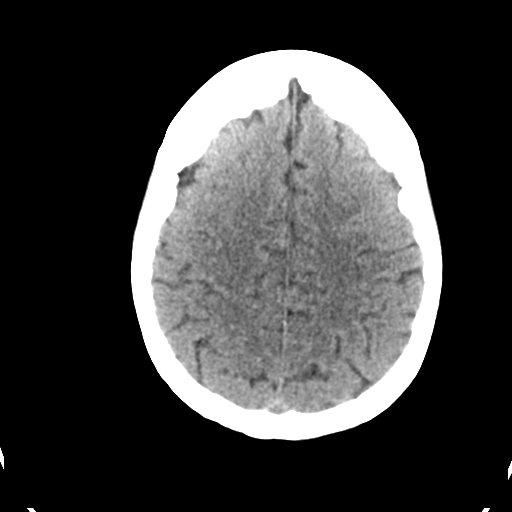
[im 21/29  bone]
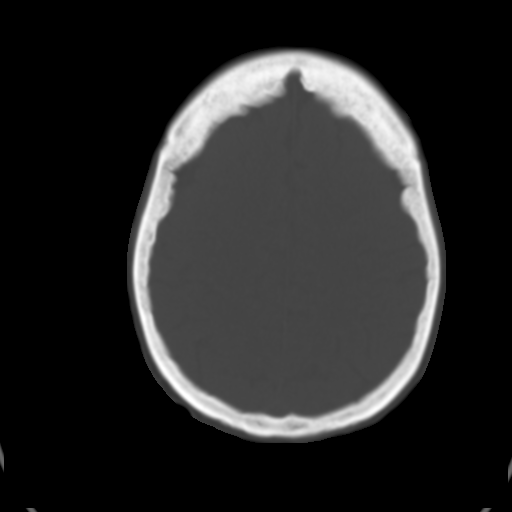
[im 25/29  brain]
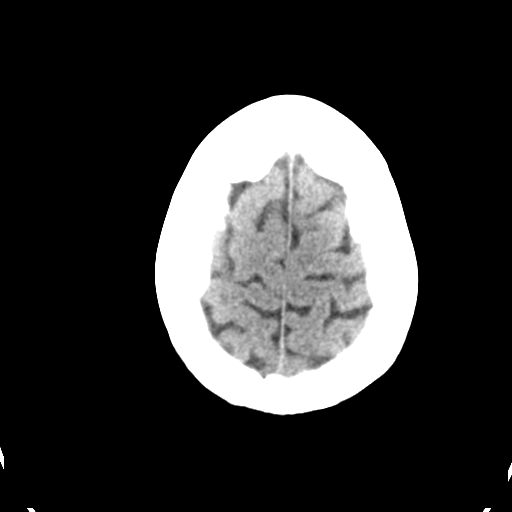

[Series 4: head bone · axial · 0.41mm/px · z∈[-130,-56]mm · 5 of 78 slices shown]
[im 8/78  bone]
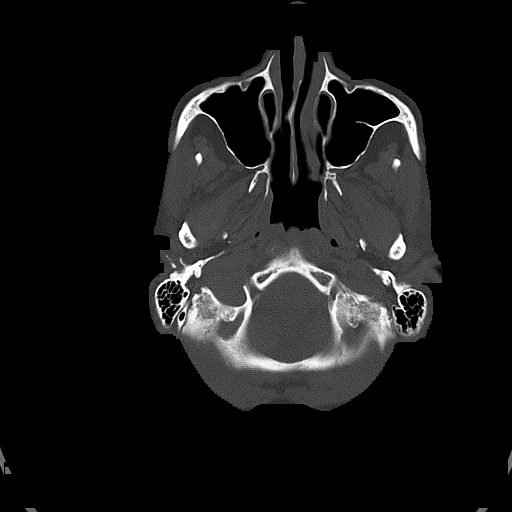
[im 15/78  bone]
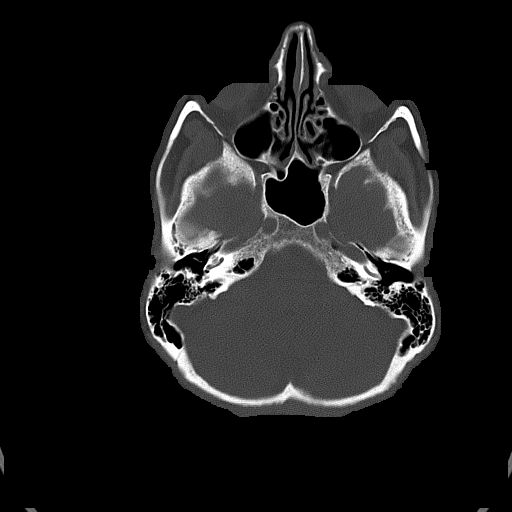
[im 26/78  bone]
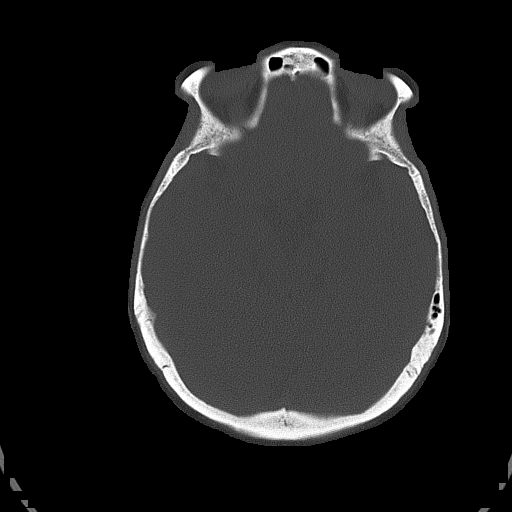
[im 34/78  bone]
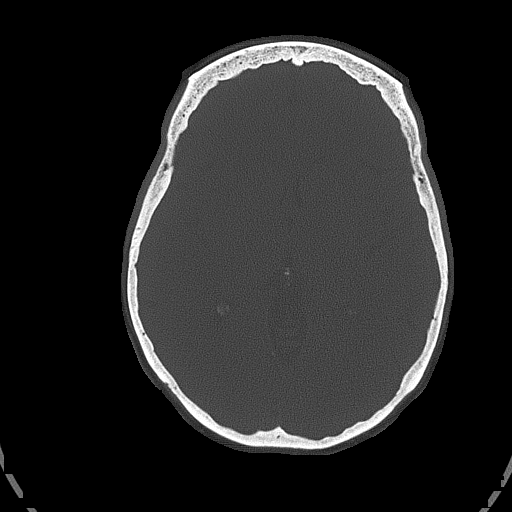
[im 45/78  bone]
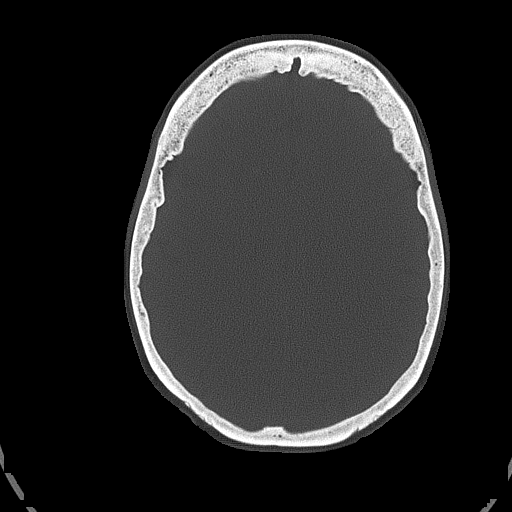

[Series 5: head without cor · coronal · non-contrast · 0.31mm/px · 3 of 67 slices shown]
[im 23/67  brain]
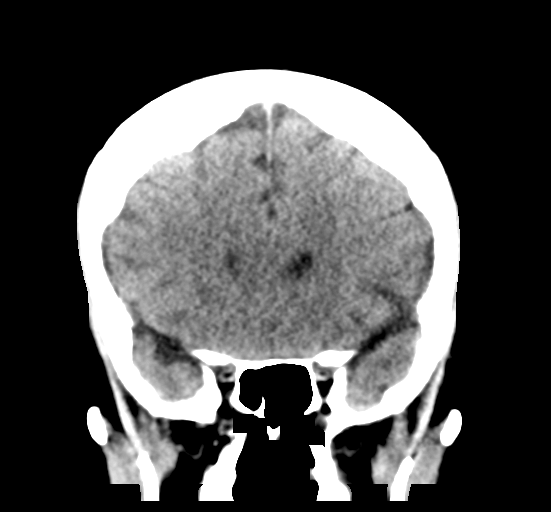
[im 30/67  brain]
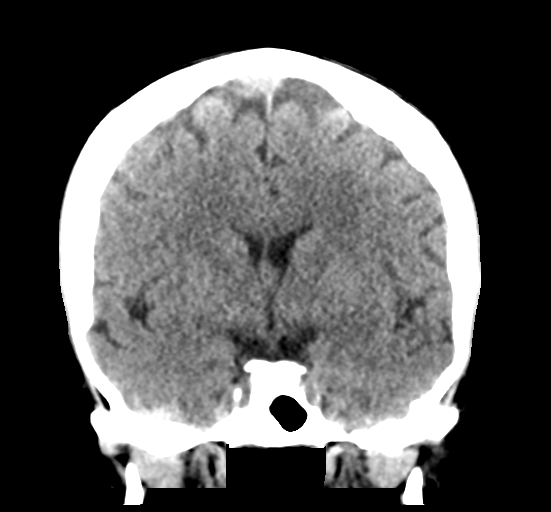
[im 37/67  brain]
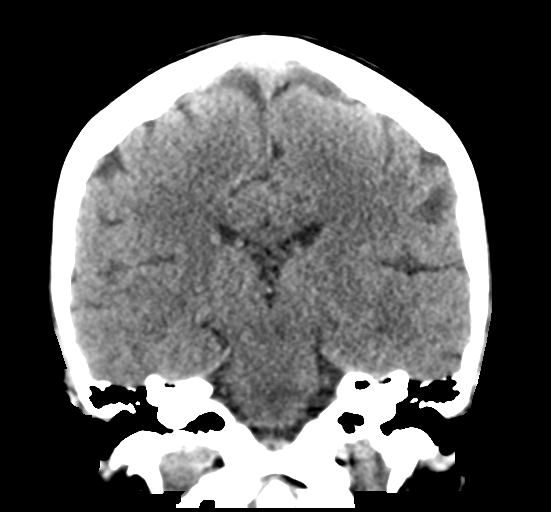

[Series 6: head without sag · sagittal · non-contrast · 0.32mm/px · 3 of 52 slices shown]
[im 18/52  brain]
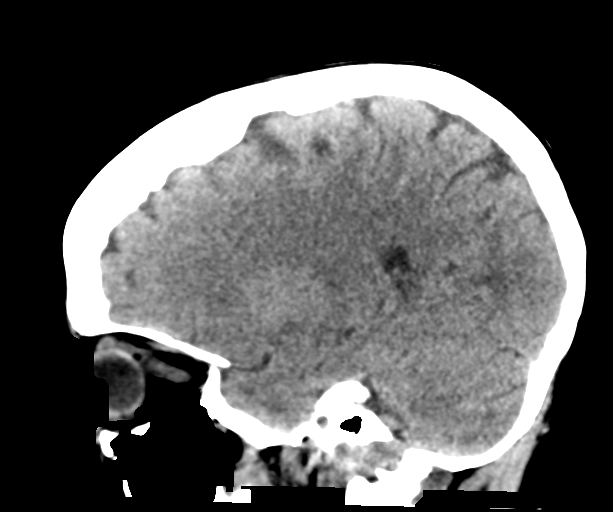
[im 26/52  brain]
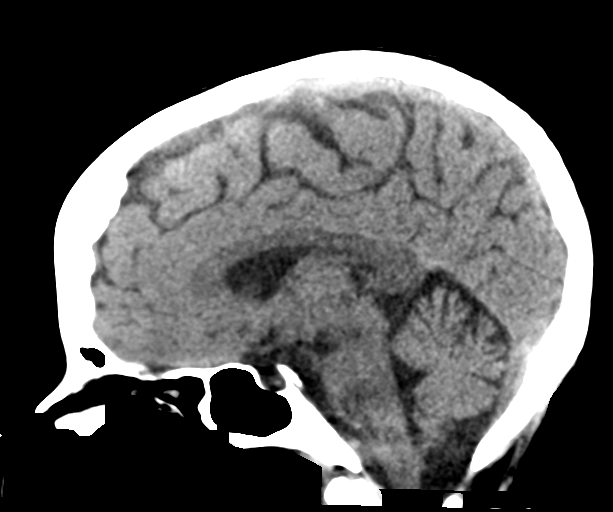
[im 35/52  brain]
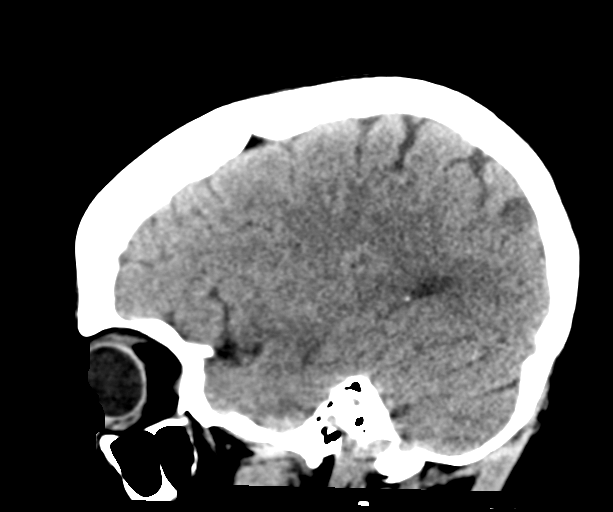

[17 of 47 positions shown; findings below may reference images not displayed]

FINDINGS: Brain: Ventricles are normal in size and configuration. There is no
mass, hemorrhage, edema or other evidence of acute parenchymal
abnormality. No extra-axial hemorrhage.

Vascular: Chronic calcified atherosclerotic changes of the large
vessels at the skull base. No unexpected hyperdense vessel.

Skull: Normal. Negative for fracture or focal lesion.

Sinuses/Orbits: No acute finding.

Other: None.
IMPRESSION: Negative head CT. No intracranial mass, hemorrhage or edema.

## 2020-01-12 MED ORDER — SODIUM CHLORIDE 0.9 % IV BOLUS
500.0000 mL | Freq: Once | INTRAVENOUS | Status: AC
  Administered 2020-01-12: 500 mL via INTRAVENOUS

## 2020-01-12 MED ORDER — INSULIN ASPART 100 UNIT/ML ~~LOC~~ SOLN
0.0000 [IU] | Freq: Three times a day (TID) | SUBCUTANEOUS | Status: DC
  Administered 2020-01-13: 5 [IU] via SUBCUTANEOUS
  Administered 2020-01-14: 1 [IU] via SUBCUTANEOUS
  Administered 2020-01-15: 3 [IU] via SUBCUTANEOUS
  Administered 2020-01-15: 5 [IU] via SUBCUTANEOUS
  Administered 2020-01-16 (×2): 1 [IU] via SUBCUTANEOUS
  Administered 2020-01-16: 2 [IU] via SUBCUTANEOUS
  Administered 2020-01-17: 1 [IU] via SUBCUTANEOUS

## 2020-01-12 MED ORDER — ACETAMINOPHEN 325 MG PO TABS
650.0000 mg | ORAL_TABLET | Freq: Four times a day (QID) | ORAL | Status: DC | PRN
  Administered 2020-01-13 – 2020-01-16 (×3): 650 mg via ORAL
  Filled 2020-01-12 (×3): qty 2

## 2020-01-12 MED ORDER — ONDANSETRON HCL 4 MG PO TABS
4.0000 mg | ORAL_TABLET | Freq: Four times a day (QID) | ORAL | Status: DC | PRN
  Administered 2020-01-16: 4 mg via ORAL
  Filled 2020-01-12: qty 1

## 2020-01-12 MED ORDER — ACETAMINOPHEN 650 MG RE SUPP: 650.0000 mg | Freq: Four times a day (QID) | RECTAL | Status: DC | PRN

## 2020-01-12 MED ORDER — HEPARIN SODIUM (PORCINE) 5000 UNIT/ML IJ SOLN
5000.0000 [IU] | Freq: Three times a day (TID) | INTRAMUSCULAR | Status: DC
  Administered 2020-01-13 – 2020-01-15 (×8): 5000 [IU] via SUBCUTANEOUS
  Filled 2020-01-12 (×6): qty 1

## 2020-01-12 MED ORDER — PANTOPRAZOLE SODIUM 40 MG PO TBEC
40.0000 mg | DELAYED_RELEASE_TABLET | Freq: Every day | ORAL | Status: DC
  Administered 2020-01-13 – 2020-01-17 (×5): 40 mg via ORAL
  Filled 2020-01-12 (×3): qty 1

## 2020-01-12 MED ORDER — DULOXETINE HCL 60 MG PO CPEP
90.0000 mg | ORAL_CAPSULE | Freq: Every day | ORAL | Status: DC
  Administered 2020-01-13 – 2020-01-17 (×5): 90 mg via ORAL
  Filled 2020-01-12 (×5): qty 1

## 2020-01-12 MED ORDER — SODIUM CHLORIDE 0.9 % IV BOLUS (SEPSIS)
1000.0000 mL | Freq: Once | INTRAVENOUS | Status: AC
  Administered 2020-01-12: 1000 mL via INTRAVENOUS

## 2020-01-12 MED ORDER — SODIUM CHLORIDE 0.9 % IV SOLN
1000.0000 mL | INTRAVENOUS | Status: DC
  Administered 2020-01-12: 1000 mL via INTRAVENOUS

## 2020-01-12 MED ORDER — ONDANSETRON HCL 4 MG/2ML IJ SOLN
4.0000 mg | Freq: Four times a day (QID) | INTRAMUSCULAR | Status: DC | PRN
  Administered 2020-01-16: 4 mg via INTRAVENOUS
  Filled 2020-01-12: qty 2

## 2020-01-12 MED ORDER — METRONIDAZOLE IN NACL 5-0.79 MG/ML-% IV SOLN
500.0000 mg | Freq: Once | INTRAVENOUS | Status: AC
  Administered 2020-01-12: 500 mg via INTRAVENOUS
  Filled 2020-01-12: qty 100

## 2020-01-12 MED ORDER — ROSUVASTATIN CALCIUM 20 MG PO TABS
20.0000 mg | ORAL_TABLET | Freq: Every day | ORAL | Status: DC
  Administered 2020-01-13 – 2020-01-17 (×5): 20 mg via ORAL
  Filled 2020-01-12 (×5): qty 1

## 2020-01-12 MED ORDER — METRONIDAZOLE IN NACL 5-0.79 MG/ML-% IV SOLN
500.0000 mg | Freq: Three times a day (TID) | INTRAVENOUS | Status: DC
  Administered 2020-01-13 – 2020-01-15 (×7): 500 mg via INTRAVENOUS
  Filled 2020-01-12 (×7): qty 100

## 2020-01-12 MED ORDER — SODIUM CHLORIDE 0.9 % IV SOLN
2.0000 g | INTRAVENOUS | Status: DC
  Administered 2020-01-13 – 2020-01-15 (×3): 2 g via INTRAVENOUS
  Filled 2020-01-12 (×3): qty 20

## 2020-01-12 MED ORDER — AMLODIPINE BESYLATE 5 MG PO TABS
2.5000 mg | ORAL_TABLET | Freq: Every day | ORAL | Status: DC
  Administered 2020-01-13 – 2020-01-17 (×5): 2.5 mg via ORAL
  Filled 2020-01-12 (×5): qty 1

## 2020-01-12 MED ORDER — PIPERACILLIN-TAZOBACTAM 3.375 G IVPB 30 MIN: 3.3750 g | Freq: Once | INTRAVENOUS | Status: DC

## 2020-01-12 MED ORDER — SODIUM CHLORIDE 0.9 % IV SOLN
1.0000 g | Freq: Once | INTRAVENOUS | Status: AC
  Administered 2020-01-12: 1 g via INTRAVENOUS
  Filled 2020-01-12: qty 10

## 2020-01-12 NOTE — ED Triage Notes (Signed)
Per [facility] pt coming from home, son states patients work called when she did not show up. Son went to patients house and patient did not answer the door. When seeing patient states she was confused. Patient answering questions states she is at [facility] and that she did not go to work because her back has been hurting.

## 2020-01-12 NOTE — ED Notes (Signed)
Pt transported to CT ?

## 2020-01-12 NOTE — H&P (Signed)
History and Physical    Sabrina Rodriguez RKY:[id number] DOB: 1951/09/18 DOA: 01/12/2020  PCP: [name], MD  Patient coming from: Home.  Chief Complaint: Confusion.  HPI: Sabrina Rodriguez is a 69 y.o. female with history of hypertension, diabetes mellitus type 2, hyperlipidemia who was recently admitted for acute diverticulitis was found to be confused by patient's and when he went to check on the patient.  Patient was brought to the ER.  At the time of my exam patient was able to give history and patient states he has been having continuous diarrhea last few days with left periumbilical pain similar to the one she had when she had diverticulitis last month.  Denies vomiting fever or chills.  Has been a watery diarrhea no blood in the diarrhea.  ED Course: In the ER patient is alert awake and oriented moving all extremities CT head unremarkable CT of the abdomen does show acute diverticulitis involving the descending and sigmoid colon noncomplicated.  Labs show acute renal failure with creatinine of 1.7 increase 0.9 at discharge.  Other labs show chloride of 116 bicarb 17 anion gap of 21 lactic acid was 0.6 hemoglobin 9.4 at baseline WBC 14.3 platelets 116 chest x-ray nothing acute.  High-sensitivity troponin was negative.  Covid test was negative.  Patient was started on fluids and antibiotics.  Review of Systems: As per HPI, rest all negative.   Past Medical History:  Diagnosis Date   Acid reflux    Diabetes mellitus without complication (HCC)    pre diabetes   Diverticulitis    Hypertension     History reviewed. No pertinent surgical history.   reports that she has never smoked. She has never used smokeless tobacco. She reports current alcohol use. No history on file for drug.  No Known Allergies  Family History  Problem Relation Age of Onset   Lung disease Mother    Hypertension Father    Hyperlipidemia Father    Bowel Disease Sister    Breast cancer Sister    Thyroid  cancer Sister     Prior to Admission medications   Medication Sig Start Date End Date Taking? Authorizing Provider  ALPRAZolam (XANAX) 0.25 MG tablet Take 0.25 mg by mouth 2 (two) times daily as needed for anxiety or sleep.    [provider]  amLODipine (NORVASC) 2.5 MG tablet Take 2.5 mg by mouth daily. 12/03/19   [provider]  DULoxetine (CYMBALTA) 30 MG capsule Take 90 mg by mouth daily. 08/19/19   [provider]  Iron, Ferrous Sulfate, 325 (65 Fe) MG TABS Take 325 mg by mouth 2 (two) times daily. 12/17/19   Alwyn Ren, MD  lisinopril (ZESTRIL) 20 MG tablet Take 20 mg by mouth daily. 11/04/19   [provider]  metFORMIN (GLUCOPHAGE) 500 MG tablet Take 500 mg by mouth at bedtime. 10/07/19   [provider]  omeprazole (PRILOSEC) 40 MG capsule Take 40 mg by mouth daily. 10/07/19   [provider]  ondansetron (ZOFRAN) 8 MG tablet Take 8 mg by mouth 3 (three) times daily. 01/06/20   [provider]  psyllium (HYDROCIL/METAMUCIL) 95 % PACK Take 1 packet by mouth 2 (two) times daily. 12/17/19   Alwyn Ren, MD  rosuvastatin (CRESTOR) 20 MG tablet Take 20 mg by mouth daily. 10/08/19   [provider]  tiZANidine (ZANAFLEX) 4 MG tablet Take 4 mg by mouth every 8 (eight) hours as needed for muscle spasms.  11/25/19   [provider]    Physical Exam: Constitutional: Moderately built and nourished. Vitals:   01/12/20 1838 01/12/20 1839 01/12/20 1840 01/12/20 1841  BP: 129/61 116/60 117/62 119/61  Pulse:      Resp: (!) 22 15 17  (!) 31  Temp:      TempSrc:      SpO2:       Eyes: Anicteric no pallor. ENMT: No discharge from the ears eyes nose or mouth. Neck: No masses.  No neck rigidity. Respiratory: No rhonchi or crepitations. Cardiovascular: S1-S2 heard. Abdomen: Mild tenderness in left lower quadrant.  No guarding or rigidity. Musculoskeletal: No edema. Skin: No rash. Neurologic: Alert awake  oriented to time place and person.  Moves all extremities. Psychiatric: Appears normal.   Labs on Admission: I have personally reviewed following labs and imaging studies  CBC: Recent Labs  Lab 01/12/20 1746 01/12/20 1752  WBC 14.3*  --   NEUTROABS 11.4*  --   HGB 9.4* 9.9*  HCT 31.2* 29.0*  MCV 85.2  --   PLT 716*  --    Basic Metabolic Panel: Recent Labs  Lab 01/12/20 1746 01/12/20 1752  NA 144 145  K 4.0 4.0  CL 116*  --   CO2 7*  --   GLUCOSE 110*  --   BUN 41*  --   CREATININE 1.71*  --   CALCIUM 9.0  --   MG 2.0  --   PHOS 4.4  --    GFR: CrCl cannot be calculated (Unknown ideal weight.). Liver Function Tests: Recent Labs  Lab 01/12/20 1746  AST 17  ALT 13  ALKPHOS 118  BILITOT 1.0  PROT 6.4*  ALBUMIN 2.4*   Recent Labs  Lab 01/12/20 1746  LIPASE 68*   Recent Labs  Lab 01/12/20 1745  AMMONIA 17   Coagulation Profile: Recent Labs  Lab 01/12/20 1746  INR 1.3*   Cardiac Enzymes: No results for input(s): CKTOTAL, CKMB, CKMBINDEX, TROPONINI in the last 168 hours. BNP (last 3 results) No results for input(s): PROBNP in the last 8760 hours. HbA1C: No results for input(s): HGBA1C in the last 72 hours. CBG: Recent Labs  Lab 01/12/20 1744  GLUCAP 101*   Lipid Profile: No results for input(s): CHOL, HDL, LDLCALC, TRIG, CHOLHDL, LDLDIRECT in the last 72 hours. Thyroid Function Tests: Recent Labs    01/12/20 1745  TSH 4.920*   Anemia Panel: No results for input(s): VITAMINB12, FOLATE, FERRITIN, TIBC, IRON, RETICCTPCT in the last 72 hours. Urine analysis:    Component Value Date/Time   COLORURINE YELLOW 12/14/2019 0021   APPEARANCEUR CLEAR 12/14/2019 0021   LABSPEC 1.011 12/14/2019 0021   PHURINE 5.0 12/14/2019 0021   GLUCOSEU NEGATIVE 12/14/2019 0021   HGBUR NEGATIVE 12/14/2019 0021   BILIRUBINUR NEGATIVE 12/14/2019 0021   KETONESUR 5 (A) 12/14/2019 0021   PROTEINUR 30 (A) 12/14/2019 0021   NITRITE NEGATIVE 12/14/2019 0021    LEUKOCYTESUR SMALL (A) 12/14/2019 0021   Sepsis Labs: @LABRCNTIP (procalcitonin:4,lacticidven:4) ) Recent Results (from the past 240 hour(s))  Respiratory Panel by RT PCR (Flu A&B, Covid) - Nasopharyngeal Swab     Status: None   Collection Time: 01/12/20  5:43 PM   Specimen: Nasopharyngeal Swab  Result Value Ref Range Status   SARS Coronavirus 2 by RT PCR NEGATIVE NEGATIVE Final    Comment: (NOTE) SARS-CoV-2 target nucleic acids are NOT DETECTED. The SARS-CoV-2 RNA is generally detectable in upper respiratoy specimens during the acute phase of infection. The lowest concentration of SARS-CoV-2 viral copies  this assay can detect is 131 copies/mL. A negative result does not preclude SARS-Cov-2 infection and should not be used as the sole basis for treatment or other patient management decisions. A negative result may occur with  improper specimen collection/handling, submission of specimen other than nasopharyngeal swab, presence of viral mutation(s) within the areas targeted by this assay, and inadequate number of viral copies (<131 copies/mL). A negative result must be combined with clinical observations, patient history, and epidemiological information. The expected result is Negative. Fact Sheet for Patients:  https://www.moore.com/ Fact Sheet for Healthcare Providers:  https://www.young.biz/ This test is not yet ap proved or cleared by the [place] FDA and  has been authorized for detection and/or diagnosis of SARS-CoV-2 by FDA under an Emergency Use Authorization (EUA). This EUA will remain  in effect (meaning this test can be used) for the duration of the COVID-19 declaration under Section 564(b)(1) of the Act, 21 U.S.C. section 360bbb-3(b)(1), unless the authorization is terminated or revoked sooner.    Influenza A by PCR NEGATIVE NEGATIVE Final   Influenza B by PCR NEGATIVE NEGATIVE Final    Comment: (NOTE) The Xpert Xpress  SARS-CoV-2/FLU/RSV assay is intended as an aid in  the diagnosis of influenza from Nasopharyngeal swab specimens and  should not be used as a sole basis for treatment. Nasal washings and  aspirates are unacceptable for Xpert Xpress SARS-CoV-2/FLU/RSV  testing. Fact Sheet for Patients: https://www.moore.com/ Fact Sheet for Healthcare Providers: https://www.young.biz/ This test is not yet approved or cleared by the [place] FDA and  has been authorized for detection and/or diagnosis of SARS-CoV-2 by  FDA under an Emergency Use Authorization (EUA). This EUA will remain  in effect (meaning this test can be used) for the duration of the  Covid-19 declaration under Section 564(b)(1) of the Act, 21  U.S.C. section 360bbb-3(b)(1), unless the authorization is  terminated or revoked. Performed at [facility], [address], [place], [place] [address]      Radiological Exams on Admission: CT ABDOMEN PELVIS WO CONTRAST  Result Date: 01/12/2020 CLINICAL DATA:  Abdominal and back pain EXAM: CT ABDOMEN AND PELVIS WITHOUT CONTRAST TECHNIQUE: Multidetector CT imaging of the abdomen and pelvis was performed following the standard protocol without IV contrast. COMPARISON:  01/12/2020 FINDINGS: Lower chest: No acute pleural or parenchymal lung disease. Hepatobiliary: Gallbladder is moderately distended with small calcified gallstone. No CT evidence of cholecystitis. The liver is unremarkable. Pancreas: Unremarkable. No pancreatic ductal dilatation or surrounding inflammatory changes. Spleen: Normal in size without focal abnormality. Adrenals/Urinary Tract: No urinary tract calculi or obstructive uropathy. Bladder is unremarkable. The adrenals are normal. Stomach/Bowel: No bowel obstruction or ileus. The cecum is midline, with a normal appendix identified. Moderate retained stool. There is wall thickening of the distal descending and sigmoid colon with mild  pericolonic fat stranding, consistent with acute uncomplicated diverticulitis. No perforation, fluid collection, or abscess. Vascular/Lymphatic: Aortic atherosclerosis. No enlarged abdominal or pelvic lymph nodes. Reproductive: Uterus and bilateral adnexa are unremarkable. Other: No abdominal wall hernia or abnormality. No abdominopelvic ascites. Musculoskeletal: No acute or destructive bony lesions. Rotatory scoliosis of the thoracolumbar spine, with multilevel spondylosis. Reconstructed images demonstrate no additional findings. IMPRESSION: 1. Acute uncomplicated diverticulitis of the distal descending and sigmoid colon. No perforation, fluid collection, or abscess. 2. Cholelithiasis without cholecystitis. Electronically Signed   By: Sharlet Salina M.D.   On: 01/12/2020 20:38   CT Head Wo Contrast  Result Date: 01/12/2020 CLINICAL DATA:  Altered mental status. History of  diabetes and hypertension. EXAM: CT HEAD WITHOUT CONTRAST TECHNIQUE: Contiguous axial images were obtained from the base of the skull through the vertex without intravenous contrast. COMPARISON:  None. FINDINGS: Brain: Ventricles are normal in size and configuration. There is no mass, hemorrhage, edema or other evidence of acute parenchymal abnormality. No extra-axial hemorrhage. Vascular: Chronic calcified atherosclerotic changes of the large vessels at the skull base. No unexpected hyperdense vessel. Skull: Normal. Negative for fracture or focal lesion. Sinuses/Orbits: No acute finding. Other: None. IMPRESSION: Negative head CT. No intracranial mass, hemorrhage or edema. Electronically Signed   By: Franki Cabot M.D.   On: 01/12/2020 19:29   DG Chest Port 1 View  Result Date: 01/12/2020 CLINICAL DATA:  Confusion EXAM: PORTABLE CHEST 1 VIEW COMPARISON:  None. FINDINGS: The heart size and mediastinal contours are within normal limits. Both lungs are clear. The visualized skeletal structures are unremarkable. IMPRESSION: No active disease.  Electronically Signed   By: Inez Catalina M.D.   On: 01/12/2020 17:59    EKG: Independently reviewed.  Normal sinus rhythm.  Assessment/Plan Active Problems:   Acute diverticulitis   Diabetes mellitus type II, non insulin dependent ([place])   Hypertension   ARF (acute renal failure) ([place])    1. Acute diverticulitis with diarrhea uncomplicated we will keep patient on empiric antibiotics check GI pathogen panel.  Continue hydration.  For now patient is on full liquid diet. 2. Acute renal failure likely from dehydration from diarrhea and poor oral intake.  We will gently hydrate hold lisinopril.  Closely for intake output metabolic panel. 3. Metabolic acidosis could be from diarrhea and dehydration.  Follow metabolic panel after hydration. 4. Brief run of SVT after admission -will check EKG follow metabolic panel check TSH may need cardiology input. 5. Hypertension on amlodipine but holding lisinopril due to acute renal failure.  As needed IV hydralazine. 6. Diabetes mellitus type 2 we will keep patient on sliding scale coverage. 7. Hyperlipidemia on statins. 8. Chronic anemia follow CBC. 9. Acute encephalopathy essentially resolved at this time.  Likely could be from renal failure and dehydration.  Closely monitor.  Given the ongoing diarrhea with abdominal pain with acute diverticulitis and renal failure with multiple comorbid condition patient will need close monitoring for any deterioration and will need inpatient status.   DVT prophylaxis: Heparin. Code Status: Full code. Family Communication: Unable to reach patient's son. Disposition Plan: Home when stable. Consults called: None. Admission status: Inpatient.   Rise Patience MD [facility] Pager [phone].  If 7PM-7AM, please contact night-coverage [email or web address] Password [facility]  01/12/2020, 10:11 PM

## 2020-01-13 DIAGNOSIS — I10 Essential (primary) hypertension: Secondary | ICD-10-CM

## 2020-01-13 LAB — CBC WITH DIFFERENTIAL/PLATELET
Abs Immature Granulocytes: 0.07 10*3/uL (ref 0.00–0.07)
Basophils Absolute: 0 10*3/uL (ref 0.0–0.1)
Basophils Relative: 0 %
Eosinophils Absolute: 0.1 10*3/uL (ref 0.0–0.5)
Eosinophils Relative: 1 %
HCT: 28.4 % — ABNORMAL LOW (ref 36.0–46.0)
Hemoglobin: 8.5 g/dL — ABNORMAL LOW (ref 12.0–15.0)
Immature Granulocytes: 1 %
Lymphocytes Relative: 15 %
Lymphs Abs: 1.6 10*3/uL (ref 0.7–4.0)
MCH: 25.1 pg — ABNORMAL LOW (ref 26.0–34.0)
MCHC: 29.9 g/dL — ABNORMAL LOW (ref 30.0–36.0)
MCV: 83.8 fL (ref 80.0–100.0)
Monocytes Absolute: 0.8 10*3/uL (ref 0.1–1.0)
Monocytes Relative: 8 %
Neutro Abs: 7.6 10*3/uL (ref 1.7–7.7)
Neutrophils Relative %: 75 %
Platelets: 563 10*3/uL — ABNORMAL HIGH (ref 150–400)
RBC: 3.39 MIL/uL — ABNORMAL LOW (ref 3.87–5.11)
RDW: 18.4 % — ABNORMAL HIGH (ref 11.5–15.5)
WBC: 10.1 10*3/uL (ref 4.0–10.5)
nRBC: 0 % (ref 0.0–0.2)

## 2020-01-13 LAB — HEPATIC FUNCTION PANEL
ALT: 12 U/L (ref 0–44)
AST: 11 U/L — ABNORMAL LOW (ref 15–41)
Albumin: 2.2 g/dL — ABNORMAL LOW (ref 3.5–5.0)
Alkaline Phosphatase: 111 U/L (ref 38–126)
Bilirubin, Direct: <0.1 mg/dL (ref 0.0–0.2)
Total Bilirubin: 0.7 mg/dL (ref 0.3–1.2)
Total Protein: 5.6 g/dL — ABNORMAL LOW (ref 6.5–8.1)

## 2020-01-13 LAB — TROPONIN I (HIGH SENSITIVITY): Troponin I (High Sensitivity): 6 ng/L (ref <18)

## 2020-01-13 LAB — URINALYSIS, ROUTINE W REFLEX MICROSCOPIC
Bilirubin Urine: NEGATIVE
Glucose, UA: NEGATIVE mg/dL
Ketones, ur: 20 mg/dL — AB
Nitrite: NEGATIVE
Protein, ur: 30 mg/dL — AB
Specific Gravity, Urine: 1.016 (ref 1.005–1.030)
pH: 6 (ref 5.0–8.0)

## 2020-01-13 LAB — BASIC METABOLIC PANEL
Anion gap: 22 — ABNORMAL HIGH (ref 5–15)
BUN: 29 mg/dL — ABNORMAL HIGH (ref 8–23)
CO2: 9 mmol/L — ABNORMAL LOW (ref 22–32)
Calcium: 8.8 mg/dL — ABNORMAL LOW (ref 8.9–10.3)
Chloride: 116 mmol/L — ABNORMAL HIGH (ref 98–111)
Creatinine, Ser: 1.27 mg/dL — ABNORMAL HIGH (ref 0.44–1.00)
GFR calc Af Amer: 51 mL/min — ABNORMAL LOW (ref >60)
GFR calc non Af Amer: 44 mL/min — ABNORMAL LOW (ref >60)
Glucose, Bld: 122 mg/dL — ABNORMAL HIGH (ref 70–99)
Potassium: 3.4 mmol/L — ABNORMAL LOW (ref 3.5–5.1)
Sodium: 147 mmol/L — ABNORMAL HIGH (ref 135–145)

## 2020-01-13 LAB — GLUCOSE, CAPILLARY
Glucose-Capillary: 109 mg/dL — ABNORMAL HIGH (ref 70–99)
Glucose-Capillary: 265 mg/dL — ABNORMAL HIGH (ref 70–99)
Glucose-Capillary: 119 mg/dL — ABNORMAL HIGH (ref 70–99)
Glucose-Capillary: 141 mg/dL — ABNORMAL HIGH (ref 70–99)

## 2020-01-13 LAB — SALICYLATE LEVEL: Salicylate Lvl: <7 mg/dL — ABNORMAL LOW (ref 7.0–30.0)

## 2020-01-13 LAB — MAGNESIUM: Magnesium: 1.8 mg/dL (ref 1.7–2.4)

## 2020-01-13 LAB — LACTIC ACID, PLASMA: Lactic Acid, Venous: 0.7 mmol/L (ref 0.5–1.9)

## 2020-01-13 MED ORDER — ENSURE ENLIVE PO LIQD
237.0000 mL | Freq: Three times a day (TID) | ORAL | Status: DC
  Administered 2020-01-13 – 2020-01-17 (×8): 237 mL via ORAL

## 2020-01-13 MED ORDER — HYDRALAZINE HCL 20 MG/ML IJ SOLN: 5.0000 mg | INTRAMUSCULAR | Status: DC | PRN

## 2020-01-13 MED ORDER — SODIUM CHLORIDE 0.9 % IV SOLN: INTRAVENOUS | Status: DC

## 2020-01-13 MED ORDER — ORAL CARE MOUTH RINSE
15.0000 mL | Freq: Two times a day (BID) | OROMUCOSAL | Status: DC
  Administered 2020-01-13 – 2020-01-17 (×8): 15 mL via OROMUCOSAL

## 2020-01-13 MED ORDER — ADULT MULTIVITAMIN W/MINERALS CH
1.0000 | ORAL_TABLET | Freq: Every day | ORAL | Status: DC
  Administered 2020-01-13 – 2020-01-17 (×5): 1 via ORAL
  Filled 2020-01-13 (×4): qty 1

## 2020-01-13 NOTE — Progress Notes (Signed)
PROGRESS NOTE    Sabrina Rodriguez  DSK:[id number] DOB: 30-Sep-1951 DOA: 01/12/2020 PCP: Carol Ada, MD  Brief Narrative:  HPI: Sabrina Rodriguez is a 69 y.o. female with history of hypertension, diabetes mellitus type 2, hyperlipidemia who was recently admitted for acute diverticulitis was found to be confused by patient's and when he went to check on the patient.  Patient was brought to the ER.  At the time of my exam patient was able to give history and patient states he has been having continuous diarrhea last few days with left periumbilical pain similar to the one she had when she had diverticulitis last month.  Denies vomiting fever or chills.  Has been a watery diarrhea no blood in the diarrhea.  Hospital course: Started on ceftriaxone and flagyl and still having diarrhea and abdominal pain. Still on fluids but eating some liquids.   Assessment & Plan:   Active Problems:   Acute diverticulitis   Diabetes mellitus type II, non insulin dependent ([place])   Hypertension   ARF (acute renal failure) ([place])  Acute diverticulitis with diarrhea: -Ceftriaxone and flagyl at this time -GI pathogen panel not collected yet -Enteric precautions until C dif can be ruled out -continue IVF today as she is not eating well yet   Acute renal failure: -Dehydration mediated -IVF still today -Holding lisinopril -Monitor renal function -Avoid nephrotoxins  Metabolic acidosis: -Likely from diarrhea, improving   Brief run of SVT after admission -Likely due to dehydration, not repeated, continue tele 1 more day -TSH normal   Hypertension: -amlodipine 2.5 mg daily -hold lisinopril -prn hydralazine  Diabetes mellitus type 2: -SSI    Hyperlipidemia -on statin  Chronic anemia -with some dilutional drop today, monitor CBC tomorrow -no blood in diarrhea no clinical signs of bleeding  DVT prophylaxis: Heparin [place] Code Status:full Family Communication: patient only Disposition Plan:  . Patient came  from:home            . Anticipated d/c place:home likely with HH . Barriers to d/c OR conditions which need to be met to effect a safe d/c:needs relief from abdominal pain and resolution/slowing of diarrhea to <3/day  Antimicrobials:   IV ceftriaxone start date 01/12/20  Flagyl start date 01/12/20  Subjective: Up in the middle of the night with defecation and urination multiple times. She is still having pain in the stomach which is not improving much. About 5/10. Denies nausea or vomiting. Tried to drink some breakfast this morning and no vomiting. Does feel very tired and is sick and tired of being sick.   Objective: Vitals:   01/12/20 2300 01/13/20 0055 01/13/20 0558 01/13/20 0844  BP: (!) 115/52  (!) 116/55 (!) 118/58  Pulse: 95  92 97  Resp: 16  18 16   Temp: 99.3 F (37.4 C)  98.9 F (37.2 C) 98.3 F (36.8 C)  TempSrc: Oral  Oral Oral  SpO2: 100%  100% 100%  Weight:  48.1 kg    Height:  5' 4"  (1.626 m)      Intake/Output Summary (Last 24 hours) at 01/13/2020 1152 Last data filed at 01/13/2020 0557 Gross per 24 hour  Intake 1866.57 ml  Output 0 ml  Net 1866.57 ml   Filed Weights   01/13/20 0055  Weight: 48.1 kg    Examination:  General exam: Appears uncomfortable Respiratory system: Clear to auscultation. Respiratory effort normal. Cardiovascular system: S1 & S2 heard, RRR. No JVD, murmurs, rubs, gallops or clicks. No pedal edema. Gastrointestinal system: Abdomen is nondistended,  soft and tender lower abdomen. No organomegaly or masses felt. Normal bowel sounds heard. Central nervous system: Alert and oriented. No focal neurological deficits. Extremities: Symmetric 5 x 5 power. Skin: No rashes, lesions or ulcers Psychiatry: Judgement and insight appear normal. Mood & affect appropriate.   Data Reviewed: I have personally reviewed following labs and imaging studies  CBC: Recent Labs  Lab 01/12/20 1746 01/12/20 1752 01/13/20 0633  WBC 14.3*  --  10.1    NEUTROABS 11.4*  --  7.6  HGB 9.4* 9.9* 8.5*  HCT 31.2* 29.0* 28.4*  MCV 85.2  --  83.8  PLT 716*  --  742*   Basic Metabolic Panel: Recent Labs  Lab 01/12/20 1746 01/12/20 1752 01/13/20 0633  NA 144 145 147*  K 4.0 4.0 3.4*  CL 116*  --  116*  CO2 7*  --  9*  GLUCOSE 110*  --  122*  BUN 41*  --  29*  CREATININE 1.71*  --  1.27*  CALCIUM 9.0  --  8.8*  MG 2.0  --  1.8  PHOS 4.4  --   --    GFR: Estimated Creatinine Clearance: 32.6 mL/min (A) (by C-G formula based on SCr of 1.27 mg/dL (H)). Liver Function Tests: Recent Labs  Lab 01/12/20 1746 01/13/20 0633  AST 17 11*  ALT 13 12  ALKPHOS 118 111  BILITOT 1.0 0.7  PROT 6.4* 5.6*  ALBUMIN 2.4* 2.2*   Recent Labs  Lab 01/12/20 1746  LIPASE 68*   Recent Labs  Lab 01/12/20 1745  AMMONIA 17   Coagulation Profile: Recent Labs  Lab 01/12/20 1746  INR 1.3*   Cardiac Enzymes: No results for input(s): CKTOTAL, CKMB, CKMBINDEX, TROPONINI in the last 168 hours. BNP (last 3 results) No results for input(s): PROBNP in the last 8760 hours. HbA1C: No results for input(s): HGBA1C in the last 72 hours. CBG: Recent Labs  Lab 01/12/20 1744 01/12/20 2321 01/13/20 0650  GLUCAP 101* 92 109*   Lipid Profile: No results for input(s): CHOL, HDL, LDLCALC, TRIG, CHOLHDL, LDLDIRECT in the last 72 hours. Thyroid Function Tests: Recent Labs    01/12/20 1745  TSH 4.920*   Anemia Panel: No results for input(s): VITAMINB12, FOLATE, FERRITIN, TIBC, IRON, RETICCTPCT in the last 72 hours. Sepsis Labs: Recent Labs  Lab 01/12/20 1745 01/12/20 2138 01/13/20 5956  LATICACIDVEN 0.9 0.6 0.7    Recent Results (from the past 240 hour(s))  Respiratory Panel by RT PCR (Flu A&B, Covid) - Nasopharyngeal Swab     Status: None   Collection Time: 01/12/20  5:43 PM   Specimen: Nasopharyngeal Swab  Result Value Ref Range Status   SARS Coronavirus 2 by RT PCR NEGATIVE NEGATIVE Final    Comment: (NOTE) SARS-CoV-2 target nucleic  acids are NOT DETECTED. The SARS-CoV-2 RNA is generally detectable in upper respiratoy specimens during the acute phase of infection. The lowest concentration of SARS-CoV-2 viral copies this assay can detect is 131 copies/mL. A negative result does not preclude SARS-Cov-2 infection and should not be used as the sole basis for treatment or other patient management decisions. A negative result may occur with  improper specimen collection/handling, submission of specimen other than nasopharyngeal swab, presence of viral mutation(s) within the areas targeted by this assay, and inadequate number of viral copies (<131 copies/mL). A negative result must be combined with clinical observations, patient history, and epidemiological information. The expected result is Negative. Fact Sheet for Patients:  PinkCheek.be Fact Sheet for Healthcare Providers:  GravelBags.it This test is not yet ap proved or cleared by the [place] and  has been authorized for detection and/or diagnosis of SARS-CoV-2 by FDA under an Emergency Use Authorization (EUA). This EUA will remain  in effect (meaning this test can be used) for the duration of the COVID-19 declaration under Section 564(b)(1) of the Act, 21 U.S.C. section 360bbb-3(b)(1), unless the authorization is terminated or revoked sooner.    Influenza A by PCR NEGATIVE NEGATIVE Final   Influenza B by PCR NEGATIVE NEGATIVE Final    Comment: (NOTE) The Xpert Xpress SARS-CoV-2/FLU/RSV assay is intended as an aid in  the diagnosis of influenza from Nasopharyngeal swab specimens and  should not be used as a sole basis for treatment. Nasal washings and  aspirates are unacceptable for Xpert Xpress SARS-CoV-2/FLU/RSV  testing. Fact Sheet for Patients: PinkCheek.be Fact Sheet for Healthcare Providers: GravelBags.it This test is not yet  approved or cleared by the [place] FDA and  has been authorized for detection and/or diagnosis of SARS-CoV-2 by  FDA under an Emergency Use Authorization (EUA). This EUA will remain  in effect (meaning this test can be used) for the duration of the  Covid-19 declaration under Section 564(b)(1) of the Act, 21  U.S.C. section 360bbb-3(b)(1), unless the authorization is  terminated or revoked. Performed at [facility], [place] [address], [place], [place] [address]   Culture, blood (routine x 2)     Status: None (Preliminary result)   Collection Time: 01/12/20  6:08 PM   Specimen: BLOOD LEFT HAND  Result Value Ref Range Status   Specimen Description BLOOD LEFT HAND  Final   Special Requests   Final    BOTTLES DRAWN AEROBIC AND ANAEROBIC Blood Culture results may not be optimal due to an inadequate volume of blood received in culture bottles   Culture   Final    NO GROWTH < 12 HOURS Performed at [facility], [place] [address] [place], [place] [address]    Report Status PENDING  Incomplete  Culture, blood (routine x 2)     Status: None (Preliminary result)   Collection Time: 01/12/20  6:20 PM   Specimen: BLOOD  Result Value Ref Range Status   Specimen Description BLOOD LEFT ANTECUBITAL  Final   Special Requests   Final    BOTTLES DRAWN AEROBIC AND ANAEROBIC Blood Culture results may not be optimal due to an inadequate volume of blood received in culture bottles   Culture   Final    NO GROWTH < 12 HOURS Performed at [facility], [place] [address], [place],  [address]    Report Status PENDING  Incomplete    Radiology Studies: CT ABDOMEN PELVIS WO CONTRAST  Result Date: 01/12/2020 CLINICAL DATA:  Abdominal and back pain EXAM: CT ABDOMEN AND PELVIS WITHOUT CONTRAST TECHNIQUE: Multidetector CT imaging of the abdomen and pelvis was performed following the standard protocol without IV contrast. COMPARISON:  01/12/2020 FINDINGS: Lower chest: No acute pleural or  parenchymal lung disease. Hepatobiliary: Gallbladder is moderately distended with small calcified gallstone. No CT evidence of cholecystitis. The liver is unremarkable. Pancreas: Unremarkable. No pancreatic ductal dilatation or surrounding inflammatory changes. Spleen: Normal in size without focal abnormality. Adrenals/Urinary Tract: No urinary tract calculi or obstructive uropathy. Bladder is unremarkable. The adrenals are normal. Stomach/Bowel: No bowel obstruction or ileus. The cecum is midline, with a normal appendix identified. Moderate retained stool. There is wall thickening of the distal descending and sigmoid colon  with mild pericolonic fat stranding, consistent with acute uncomplicated diverticulitis. No perforation, fluid collection, or abscess. Vascular/Lymphatic: Aortic atherosclerosis. No enlarged abdominal or pelvic lymph nodes. Reproductive: Uterus and bilateral adnexa are unremarkable. Other: No abdominal wall hernia or abnormality. No abdominopelvic ascites. Musculoskeletal: No acute or destructive bony lesions. Rotatory scoliosis of the thoracolumbar spine, with multilevel spondylosis. Reconstructed images demonstrate no additional findings. IMPRESSION: 1. Acute uncomplicated diverticulitis of the distal descending and sigmoid colon. No perforation, fluid collection, or abscess. 2. Cholelithiasis without cholecystitis. Electronically Signed   By: Randa Ngo M.D.   On: 01/12/2020 20:38   CT Head Wo Contrast  Result Date: 01/12/2020 CLINICAL DATA:  Altered mental status. History of diabetes and hypertension. EXAM: CT HEAD WITHOUT CONTRAST TECHNIQUE: Contiguous axial images were obtained from the base of the skull through the vertex without intravenous contrast. COMPARISON:  None. FINDINGS: Brain: Ventricles are normal in size and configuration. There is no mass, hemorrhage, edema or other evidence of acute parenchymal abnormality. No extra-axial hemorrhage. Vascular: Chronic calcified  atherosclerotic changes of the large vessels at the skull base. No unexpected hyperdense vessel. Skull: Normal. Negative for fracture or focal lesion. Sinuses/Orbits: No acute finding. Other: None. IMPRESSION: Negative head CT. No intracranial mass, hemorrhage or edema. Electronically Signed   By: Franki Cabot M.D.   On: 01/12/2020 19:29   DG Chest Port 1 View  Result Date: 01/12/2020 CLINICAL DATA:  Confusion EXAM: PORTABLE CHEST 1 VIEW COMPARISON:  None. FINDINGS: The heart size and mediastinal contours are within normal limits. Both lungs are clear. The visualized skeletal structures are unremarkable. IMPRESSION: No active disease. Electronically Signed   By: Inez Catalina M.D.   On: 01/12/2020 17:59   Scheduled Meds: . amLODipine  2.5 mg Oral Daily  . DULoxetine  90 mg Oral Daily  . heparin  5,000 Units Subcutaneous Q8H  . insulin aspart  0-9 Units Subcutaneous TID WC  . pantoprazole  40 mg Oral Daily  . rosuvastatin  20 mg Oral Daily   Continuous Infusions: . sodium chloride 75 mL/hr at 01/13/20 0557  . cefTRIAXone (ROCEPHIN)  IV    . metronidazole 500 mg (01/13/20 0510)    LOS: 1 day   Time spent: 25  Hoyt Koch, MD [facility]  To contact the attending provider between 7A-7P or the covering provider during after hours 7P-7A, please log into the web site [email or web address] and access using universal [place] password for that web site. If you do not have the password, please call the hospital operator.  01/13/2020, 11:52 AM

## 2020-01-13 NOTE — ED Provider Notes (Signed)
[facility] Provider Note   CSN: [id number] Arrival date & time: 01/12/20  1736     History Chief Complaint  Patient presents with  . Altered Mental Status    Sabrina Rodriguez is a 69 y.o. female.  HPI Patient had diverticulitis and was admitted to the hospital with discharge date of 1\19\2021.  Patient has been having abdominal pain for several days.  She reports that her back and lower abdomen have been uncomfortable.  She has not really build to eat or drink much.  Today, concerns arose when the patient did not go to work.  They called looking for her.  Someone did go to her home to check on her but she did not come to the door.  They were very concerned.  On EMS arrival patient did ultimately come to the door but seems somewhat confused.  She did not have focal motor deficits on their assessment.  She was tachycardic.  She is more alert at time of assessment.  Her main complaint is general weakness and persistent lower abdominal pain.    Past Medical History:  Diagnosis Date  . Acid reflux   . Diabetes mellitus without complication (HCC)    pre diabetes  . Diverticulitis   . Hypertension     Patient Active Problem List   Diagnosis Date Noted  . ARF (acute renal failure) (HCC) 01/12/2020  . Acute diverticulitis 12/13/2019  . Renal failure 12/13/2019  . Metabolic acidosis 12/13/2019  . Diabetes mellitus type II, non insulin dependent (HCC) 12/13/2019  . Pancreatitis 12/13/2019  . Hypertension     History reviewed. No pertinent surgical history.   OB History   No obstetric history on file.     Family History  Problem Relation Age of Onset  . Lung disease Mother   . Hypertension Father   . Hyperlipidemia Father   . Bowel Disease Sister   . Breast cancer Sister   . Thyroid cancer Sister     Social History   Tobacco Use  . Smoking status: Never Smoker  . Smokeless tobacco: Never Used  Substance Use Topics  . Alcohol use: Yes    Comment:  occasional glass of wine  . Drug use: Not on file    Home Medications Prior to Admission medications   Medication Sig Start Date End Date Taking? Authorizing Provider  ALPRAZolam (XANAX) 0.25 MG tablet Take 0.25 mg by mouth 2 (two) times daily as needed for anxiety or sleep.    [provider]  amLODipine (NORVASC) 2.5 MG tablet Take 2.5 mg by mouth daily. 12/03/19   [provider]  DULoxetine (CYMBALTA) 30 MG capsule Take 90 mg by mouth daily. 08/19/19   [provider]  Iron, Ferrous Sulfate, 325 (65 Fe) MG TABS Take 325 mg by mouth 2 (two) times daily. 12/17/19   Alwyn Ren, MD  lisinopril (ZESTRIL) 20 MG tablet Take 20 mg by mouth daily. 11/04/19   [provider]  metFORMIN (GLUCOPHAGE) 500 MG tablet Take 500 mg by mouth at bedtime. 10/07/19   [provider]  omeprazole (PRILOSEC) 40 MG capsule Take 40 mg by mouth daily. 10/07/19   [provider]  ondansetron (ZOFRAN) 8 MG tablet Take 8 mg by mouth 3 (three) times daily. 01/06/20   [provider]  psyllium (HYDROCIL/METAMUCIL) 95 % PACK Take 1 packet by mouth 2 (two) times daily. 12/17/19   Alwyn Ren, MD  rosuvastatin (CRESTOR) 20 MG tablet Take 20  mg by mouth daily. 10/08/19   [provider]  tiZANidine (ZANAFLEX) 4 MG tablet Take 4 mg by mouth every 8 (eight) hours as needed for muscle spasms.  11/25/19   [provider]    Allergies    Patient has no known allergies.  Review of Systems   Review of Systems 10 Systems reviewed and are negative for acute change except as noted in the HPI.  Physical Exam Updated Vital Signs BP (!) 115/52 (BP Location: Right Arm)   Pulse 95   Temp 99.3 F (37.4 C) (Oral)   Resp 16   SpO2 100%   Physical Exam Constitutional:      Comments: Patient is very frail in appearance.  No respiratory distress at rest.  Awake.  Mildly confused but not somnolent or delirious.  HENT:     Head: Normocephalic  and atraumatic.     Mouth/Throat:     Comments: Mucous membranes slightly dry.  Airway widely patent. Eyes:     Extraocular Movements: Extraocular movements intact.     Pupils: Pupils are equal, round, and reactive to light.  Cardiovascular:     Comments: Tachycardia.  No gross rub murmur gallop. Pulmonary:     Effort: Pulmonary effort is normal.     Breath sounds: Normal breath sounds.  Abdominal:     Comments: Abdomen is soft without guarding.  Patient continues to endorse moderate left lower quadrant discomfort.  Musculoskeletal:     Comments: No peripheral edema.  Extremities are very thin.  No calf tenderness.  No significant wounds or swelling to the extremities.  No appearance of cellulitis to the legs.  Neurological:     Comments: Patient is mildly confused but assistant follows commands.  She does not have focal motor deficit.  She can use both upper extremities and move both lower extremities to command.  Psychiatric:        Mood and Affect: Mood normal.     ED Results / Procedures / Treatments   Labs (all labs ordered are listed, but only abnormal results are displayed) Labs Reviewed  COMPREHENSIVE METABOLIC PANEL - Abnormal; Notable for the following components:      Result Value   Chloride 116 (*)    CO2 7 (*)    Glucose, Bld 110 (*)    BUN 41 (*)    Creatinine, Ser 1.71 (*)    Total Protein 6.4 (*)    Albumin 2.4 (*)    GFR calc non Af Amer 30 (*)    GFR calc Af Amer 35 (*)    Anion gap 21 (*)    All other components within normal limits  LIPASE, BLOOD - Abnormal; Notable for the following components:   Lipase 68 (*)    All other components within normal limits  BRAIN NATRIURETIC PEPTIDE - Abnormal; Notable for the following components:   B Natriuretic Peptide 138.5 (*)    All other components within normal limits  CBC WITH DIFFERENTIAL/PLATELET - Abnormal; Notable for the following components:   WBC 14.3 (*)    RBC 3.66 (*)    Hemoglobin 9.4 (*)    HCT  31.2 (*)    MCH 25.7 (*)    RDW 18.3 (*)    Platelets 716 (*)    Neutro Abs 11.4 (*)    Abs Immature Granulocytes 0.09 (*)    All other components within normal limits  PROTIME-INR - Abnormal; Notable for the following components:   Prothrombin Time 15.6 (*)  INR 1.3 (*)    All other components within normal limits  TSH - Abnormal; Notable for the following components:   TSH 4.920 (*)    All other components within normal limits  CBG MONITORING, ED - Abnormal; Notable for the following components:   Glucose-Capillary 101 (*)    All other components within normal limits  POCT I-STAT EG7 - Abnormal; Notable for the following components:   pH, Ven 7.115 (*)    pCO2, Ven 22.2 (*)    pO2, Ven 75.0 (*)    Bicarbonate 7.1 (*)    TCO2 8 (*)    Acid-base deficit 21.0 (*)    HCT 29.0 (*)    Hemoglobin 9.9 (*)    All other components within normal limits  RESPIRATORY PANEL BY RT PCR (FLU A&B, COVID)  CULTURE, BLOOD (ROUTINE X 2)  CULTURE, BLOOD (ROUTINE X 2)  LACTIC ACID, PLASMA  LACTIC ACID, PLASMA  MAGNESIUM  PHOSPHORUS  AMMONIA  GLUCOSE, CAPILLARY  URINALYSIS, ROUTINE W REFLEX MICROSCOPIC  TROPONIN I (HIGH SENSITIVITY)  TROPONIN I (HIGH SENSITIVITY)    EKG EKG Interpretation  Date/Time:  Sunday January 12 2020 17:48:58 EST Ventricular Rate:  98 PR Interval:    QRS Duration: 110 QT Interval:  359 QTC Calculation: 459 R Axis:   53 Text Interpretation: Unknown rhythm, irregular rate Incomplete left bundle branch block Anterior Q waves, possibly due to ILBBB no acute ischemic appaerance. no old Confirmed by Arby Barrette [phone]) on 01/13/2020 12:22:55 AM   Radiology CT ABDOMEN PELVIS WO CONTRAST  Result Date: 01/12/2020 CLINICAL DATA:  Abdominal and back pain EXAM: CT ABDOMEN AND PELVIS WITHOUT CONTRAST TECHNIQUE: Multidetector CT imaging of the abdomen and pelvis was performed following the standard protocol without IV contrast. COMPARISON:  01/12/2020 FINDINGS:  Lower chest: No acute pleural or parenchymal lung disease. Hepatobiliary: Gallbladder is moderately distended with small calcified gallstone. No CT evidence of cholecystitis. The liver is unremarkable. Pancreas: Unremarkable. No pancreatic ductal dilatation or surrounding inflammatory changes. Spleen: Normal in size without focal abnormality. Adrenals/Urinary Tract: No urinary tract calculi or obstructive uropathy. Bladder is unremarkable. The adrenals are normal. Stomach/Bowel: No bowel obstruction or ileus. The cecum is midline, with a normal appendix identified. Moderate retained stool. There is wall thickening of the distal descending and sigmoid colon with mild pericolonic fat stranding, consistent with acute uncomplicated diverticulitis. No perforation, fluid collection, or abscess. Vascular/Lymphatic: Aortic atherosclerosis. No enlarged abdominal or pelvic lymph nodes. Reproductive: Uterus and bilateral adnexa are unremarkable. Other: No abdominal wall hernia or abnormality. No abdominopelvic ascites. Musculoskeletal: No acute or destructive bony lesions. Rotatory scoliosis of the thoracolumbar spine, with multilevel spondylosis. Reconstructed images demonstrate no additional findings. IMPRESSION: 1. Acute uncomplicated diverticulitis of the distal descending and sigmoid colon. No perforation, fluid collection, or abscess. 2. Cholelithiasis without cholecystitis. Electronically Signed   By: Sharlet Salina M.D.   On: 01/12/2020 20:38   CT Head Wo Contrast  Result Date: 01/12/2020 CLINICAL DATA:  Altered mental status. History of diabetes and hypertension. EXAM: CT HEAD WITHOUT CONTRAST TECHNIQUE: Contiguous axial images were obtained from the base of the skull through the vertex without intravenous contrast. COMPARISON:  None. FINDINGS: Brain: Ventricles are normal in size and configuration. There is no mass, hemorrhage, edema or other evidence of acute parenchymal abnormality. No extra-axial hemorrhage.  Vascular: Chronic calcified atherosclerotic changes of the large vessels at the skull base. No unexpected hyperdense vessel. Skull: Normal. Negative for fracture or focal lesion. Sinuses/Orbits: No acute finding. Other: None.  IMPRESSION: Negative head CT. No intracranial mass, hemorrhage or edema. Electronically Signed   By: Bary Richard M.D.   On: 01/12/2020 19:29   DG Chest Port 1 View  Result Date: 01/12/2020 CLINICAL DATA:  Confusion EXAM: PORTABLE CHEST 1 VIEW COMPARISON:  None. FINDINGS: The heart size and mediastinal contours are within normal limits. Both lungs are clear. The visualized skeletal structures are unremarkable. IMPRESSION: No active disease. Electronically Signed   By: Alcide Clever M.D.   On: 01/12/2020 17:59    Procedures Procedures (including critical care time) CRITICAL CARE Performed by: Arby Barrette   Total critical care time: 30 minutes  Critical care time was exclusive of separately billable procedures and treating other patients.  Critical care was necessary to treat or prevent imminent or life-threatening deterioration.  Critical care was time spent personally by me on the following activities: development of treatment plan with patient and/or surrogate as well as nursing, discussions with consultants, evaluation of patient's response to treatment, examination of patient, obtaining history from patient or surrogate, ordering and performing treatments and interventions, ordering and review of laboratory studies, ordering and review of radiographic studies, pulse oximetry and re-evaluation of patient's condition. Medications Ordered in ED Medications  amLODipine (NORVASC) tablet 2.5 mg (has no administration in time range)  rosuvastatin (CRESTOR) tablet 20 mg (has no administration in time range)  DULoxetine (CYMBALTA) DR capsule 90 mg (has no administration in time range)  pantoprazole (PROTONIX) EC tablet 40 mg (has no administration in time range)   acetaminophen (TYLENOL) tablet 650 mg (has no administration in time range)    Or  acetaminophen (TYLENOL) suppository 650 mg (has no administration in time range)  ondansetron (ZOFRAN) tablet 4 mg (has no administration in time range)    Or  ondansetron (ZOFRAN) injection 4 mg (has no administration in time range)  insulin aspart (novoLOG) injection 0-9 Units (has no administration in time range)  heparin injection 5,000 Units (has no administration in time range)  metroNIDAZOLE (FLAGYL) IVPB 500 mg (has no administration in time range)  cefTRIAXone (ROCEPHIN) 2 g in sodium chloride 0.9 % 100 mL IVPB (has no administration in time range)  sodium chloride 0.9 % bolus 500 mL (0 mLs Intravenous Stopped 01/12/20 1921)  sodium chloride 0.9 % bolus 1,000 mL (0 mLs Intravenous Stopped 01/12/20 2220)  cefTRIAXone (ROCEPHIN) 1 g in sodium chloride 0.9 % 100 mL IVPB (0 g Intravenous Stopped 01/12/20 2220)  metroNIDAZOLE (FLAGYL) IVPB 500 mg (500 mg Intravenous New Bag/Given 01/12/20 2130)    ED Course  I have reviewed the triage vital signs and the nursing notes.  Pertinent labs & imaging results that were available during my care of the patient were reviewed by me and considered in my medical decision making (see chart for details).  Clinical Course as of Jan 12 22  Wynelle Link Jan 12, 2020  2200 Consult: Toniann Fail for admission.   [MP]  2204 Consult: Dr. Toniann Fail for admission.   [MP]    Clinical Course User Index [MP] Arby Barrette, MD   MDM Rules/Calculators/A&P                     Patient presents with mental status change.  This appears likely secondary to significant dehydration and recrudescence of diverticulitis.  She does have leukocytosis.  She at this time does not have fever.  She is not hypotensive.  CT however does show persistent diverticulitis.  Will restart Rocephin and Flagyl and rehydration.  Will admit  the patient for inpatient treatment. Final Clinical Impression(s) / ED  Diagnoses Final diagnoses:  Confusion  Dehydration  AKI (acute kidney injury) (HCC)  Diverticulitis    Rx / DC Orders ED Discharge Orders    None       Arby Barrette, MD 01/13/20 0025

## 2020-01-13 NOTE — Progress Notes (Signed)
New Admission Note: ? Arrival Method: Stretcher Mental Orientation: Alert and oriented to self and place, disoriented to time and situation.  Telemetry: Box 14, CCMD verified Assessment: Completed Skin: Refer to flowsheet IV: Left  Antecubital  Pain: 0/10 Tubes: None Safety Measures: Safety Fall Prevention Plan discussed with patient. Admission: Completed [facility] Orientation: Patient has been orientated to the room, unit and the staff. Family: None at the bedside at this time Orders have been reviewed and are being implemented. Will continue to monitor the patient. Call light has been placed within reach and bed alarm has been activated.  ? Donia Guiles, RN  Phone Number: [phone]

## 2020-01-13 NOTE — Plan of Care (Signed)
  Problem: Education: Goal: Knowledge of General Education information will improve Description Including pain rating scale, medication(s)/side effects and non-pharmacologic comfort measures Outcome: Progressing   Problem: Health Behavior/Discharge Planning: Goal: Ability to manage health-related needs will improve Outcome: Progressing   

## 2020-01-13 NOTE — Progress Notes (Signed)
Initial Nutrition Assessment  DOCUMENTATION CODES:   Underweight, Severe malnutrition in context of acute illness/injury  INTERVENTION:    Ensure Enlive po TID, each supplement provides 350 kcal and 20 grams of protein  Hormel Shake BID between meals, each supplement provides 520 kcals and 22 grams of protein  MVI daily   NUTRITION DIAGNOSIS:   Severe Malnutrition related to acute illness(acute diverticulitis) as evidenced by severe fat depletion, severe muscle depletion, energy intake < or equal to 50% for > or equal to 5 days, percent weight loss.  GOAL:   Patient will meet greater than or equal to 90% of their needs  MONITOR:   PO intake, Supplement acceptance, Diet advancement, TF tolerance, Weight trends, I & O's, Labs  REASON FOR ASSESSMENT:   Malnutrition Screening Tool    ASSESSMENT:   Patient with PMH significant for HTN, DM, HLD, and diverticulitis. Presents this admission with acute diverticulitis and ARF.  Pt reports having decreased appetite for 3-4 weeks due to ongoing abdominal pain. States during this time she consumed bites at each meal and had 1-2 protein shakes daily (unable to remember the name). Prior to this she was not a big eater but was able to tolerate more of her meal. Pt currently on full liquids. RD observed meal tray with hormel shake 100% completed and untouched grits. Discussed the importance of protein intake for preservation of lean body mass. Pt willing to continue homel shakes and try Ensure.   Pt endorses a UBW of 120 lb and a recent wt loss of 15 lb. Records indicate pt weighed 120 lb on 12/14/19 and 106 lb this admission (11.7% wt loss in one month, significant for time frame).   Medications: SS novolog Labs: Na 147 (H) K 3.4 (L) CBG 92-265  NUTRITION - FOCUSED PHYSICAL EXAM:    Most Recent Value  Orbital Region  Severe depletion  Upper Arm Region  Severe depletion  Thoracic and Lumbar Region  Severe depletion  Buccal Region   Severe depletion  Temple Region  Severe depletion  Clavicle Bone Region  Severe depletion  Clavicle and Acromion Bone Region  Severe depletion  Scapular Bone Region  Severe depletion  Dorsal Hand  Severe depletion  Patellar Region  Severe depletion  Anterior Thigh Region  Severe depletion  Posterior Calf Region  Severe depletion  Edema (RD Assessment)  None  Hair  Reviewed  Eyes  Reviewed  Mouth  Reviewed  Skin  Reviewed  Nails  Reviewed     Diet Order:   Diet Order            Diet full liquid Room service appropriate? Yes; Fluid consistency: Thin  Diet effective now              EDUCATION NEEDS:   Education needs have been addressed  Skin:  Skin Assessment: Reviewed RN Assessment  Last BM:  PTA  Height:   Ht Readings from Last 1 Encounters:  01/13/20 5\' 4"  (1.626 m)    Weight:   Wt Readings from Last 1 Encounters:  01/13/20 48.1 kg    Ideal Body Weight:  54.5 kg  BMI:  Body mass index is 18.21 kg/m.  Estimated Nutritional Needs:   Kcal:  1650-1850 kcal  Protein:  80-95 grams  Fluid:  >/= 1.6 L/day   01/15/20 RD, LDN Clinical Nutrition Pager listed in AMION

## 2020-01-14 DIAGNOSIS — E43 Unspecified severe protein-calorie malnutrition: Secondary | ICD-10-CM | POA: Diagnosis present

## 2020-01-14 LAB — CBC
HCT: 22.8 % — ABNORMAL LOW (ref 36.0–46.0)
Hemoglobin: 7 g/dL — ABNORMAL LOW (ref 12.0–15.0)
MCH: 25 pg — ABNORMAL LOW (ref 26.0–34.0)
MCHC: 30.7 g/dL (ref 30.0–36.0)
MCV: 81.4 fL (ref 80.0–100.0)
Platelets: 474 10*3/uL — ABNORMAL HIGH (ref 150–400)
RBC: 2.8 MIL/uL — ABNORMAL LOW (ref 3.87–5.11)
RDW: 18 % — ABNORMAL HIGH (ref 11.5–15.5)
WBC: 7 10*3/uL (ref 4.0–10.5)
nRBC: 0.3 % — ABNORMAL HIGH (ref 0.0–0.2)

## 2020-01-14 LAB — BASIC METABOLIC PANEL
Anion gap: 13 (ref 5–15)
BUN: 20 mg/dL (ref 8–23)
CO2: 12 mmol/L — ABNORMAL LOW (ref 22–32)
Calcium: 7.9 mg/dL — ABNORMAL LOW (ref 8.9–10.3)
Chloride: 118 mmol/L — ABNORMAL HIGH (ref 98–111)
Creatinine, Ser: 0.78 mg/dL (ref 0.44–1.00)
GFR calc Af Amer: >60 mL/min (ref >60)
GFR calc non Af Amer: >60 mL/min (ref >60)
Glucose, Bld: 123 mg/dL — ABNORMAL HIGH (ref 70–99)
Potassium: 3.1 mmol/L — ABNORMAL LOW (ref 3.5–5.1)
Sodium: 143 mmol/L (ref 135–145)

## 2020-01-14 LAB — GLUCOSE, CAPILLARY
Glucose-Capillary: 106 mg/dL — ABNORMAL HIGH (ref 70–99)
Glucose-Capillary: 127 mg/dL — ABNORMAL HIGH (ref 70–99)
Glucose-Capillary: 115 mg/dL — ABNORMAL HIGH (ref 70–99)
Glucose-Capillary: 87 mg/dL (ref 70–99)

## 2020-01-14 NOTE — Progress Notes (Addendum)
PROGRESS NOTE    Sabrina Rodriguez  EHM:[id number] DOB: [birth year] DOA: 01/12/2020 PCP: Carol Ada, MD   Brief Narrative:  ZMO:QHUTML Eliumis a 69 y.o.femalewithhistory of hypertension, diabetes mellitus type 2, hyperlipidemia who was recently admitted for acute diverticulitis was found to be confused by patient's and when he went to check on the patient. Patient was brought to the ER. At the time of my exam patient was able to give history and patient states he has been having continuous diarrhea last few days with left periumbilical pain similar to the one she had when she had diverticulitis last month. Denies vomiting fever or chills. Has been a watery diarrhea no blood in the diarrhea.  Hospital course: Started on ceftriaxone and flagyl and finally 01/14/20 diarrhea and abdominal pain improving. Poor appetite but drinking some liquids.    Assessment & Plan:   Active Problems:   Acute diverticulitis   Diabetes mellitus type II, non insulin dependent ([place])   Hypertension   ARF (acute renal failure) (HCC)   Protein-calorie malnutrition, severe  Acute diverticulitis with diarrhea: -Ceftriaxone and flagyl at this time -plan to change to oral augmentin or cipro/flagyl tomorrow as she is finally having clinical improvement today -GI pathogen panel not collected yet no diarrhea since 01/13/20 -Enteric precautions until C dif can be ruled out  Acute renal failure: -Dehydration mediated -IVF stopped and Cr improving -Holding lisinopril -Monitor renal function -Avoid nephrotoxins  Metabolic acidosis: -Likely from diarrhea, improving   Brief run of SVT after admission -D/C tele today -TSH normal  Hypertension: -amlodipine 2.5 mg daily -hold lisinopril -prn hydralazine  Diabetes mellitus type 2: -SSI    Hyperlipidemia -on statin  Protein calorie malnutrition, severe: -with significant weight drop in last month due to poor intake -nutrition assessment and  ordered supplements -encouraged oral intake and supplements  Chronic anemia -with some drop today to 7.0 -no blood in diarrhea no clinical signs of bleeding -monitor CBC tomorrow, if lower needs transfusion -ordered FOBT if BM today  DVT prophylaxis: heparin Code Status: full Family Communication: patient only Disposition Plan:  . Patient came from:home           . Anticipated d/c place:home likely with HH . Barriers to d/c OR conditions which need to be met to effect a safe d/c:needs switch to oral antibiotics tomorrow and PT/OT assessment for home safety prior to D/C with continued lack of diarrhea and pain in abdomen   Consultants:   none  Procedures:   none  Antimicrobials:   IV ceftriaxone start date 01/12/20  IV flagyl start date 01/12/20  Subjective: Feeling slightly better today. No diarrhea yesterday. Was able to eat slight amount of food, drinking some supplements. Talked to nutritionist yesterday. Feels very weak still. Abdomen is not hurting much anymore.   Objective: Vitals:   01/13/20 0844 01/13/20 2131 01/14/20 0500 01/14/20 0844  BP: (!) 118/58 113/66 110/70 115/68  Pulse: 97 88 90 82  Resp: 16 16 18 18   Temp: 98.3 F (36.8 C) 99.3 F (37.4 C) (!) 97.4 F (36.3 C) 97.6 F (36.4 C)  TempSrc: Oral Oral Oral Oral  SpO2: 100% 96% 98% 100%  Weight:      Height:        Intake/Output Summary (Last 24 hours) at 01/14/2020 1100 Last data filed at 01/14/2020 0941 Gross per 24 hour  Intake 1520.78 ml  Output 0 ml  Net 1520.78 ml   Filed Weights   01/13/20 0055  Weight: 48.1 kg  Examination:  General exam: Appears calm and comfortable, thin Respiratory system: Clear to auscultation. Respiratory effort normal. Cardiovascular system: S1 & S2 heard, RRR. No JVD, murmurs, rubs, gallops or clicks. No pedal edema. Gastrointestinal system: Abdomen is nondistended, soft and nontender. No organomegaly or masses felt. Normal bowel sounds heard. Central  nervous system: Alert and oriented. No focal neurological deficits. Extremities: Symmetric 5 x 5 power. Skin: No rashes, lesions or ulcers Psychiatry: Judgement and insight appear normal. Mood & affect appropriate.   Data Reviewed: I have personally reviewed following labs and imaging studies  CBC: Recent Labs  Lab 01/12/20 1746 01/12/20 1752 01/13/20 0633 01/14/20 0436  WBC 14.3*  --  10.1 7.0  NEUTROABS 11.4*  --  7.6  --   HGB 9.4* 9.9* 8.5* 7.0*  HCT 31.2* 29.0* 28.4* 22.8*  MCV 85.2  --  83.8 81.4  PLT 716*  --  563* 468*   Basic Metabolic Panel: Recent Labs  Lab 01/12/20 1746 01/12/20 1752 01/13/20 0633 01/14/20 0436  NA 144 145 147* 143  K 4.0 4.0 3.4* 3.1*  CL 116*  --  116* 118*  CO2 7*  --  9* 12*  GLUCOSE 110*  --  122* 123*  BUN 41*  --  29* 20  CREATININE 1.71*  --  1.27* 0.78  CALCIUM 9.0  --  8.8* 7.9*  MG 2.0  --  1.8  --   PHOS 4.4  --   --   --    GFR: Estimated Creatinine Clearance: 51.8 mL/min (by C-G formula based on SCr of 0.78 mg/dL). Liver Function Tests: Recent Labs  Lab 01/12/20 1746 01/13/20 0633  AST 17 11*  ALT 13 12  ALKPHOS 118 111  BILITOT 1.0 0.7  PROT 6.4* 5.6*  ALBUMIN 2.4* 2.2*   Recent Labs  Lab 01/12/20 1746  LIPASE 68*   Recent Labs  Lab 01/12/20 1745  AMMONIA 17   Coagulation Profile: Recent Labs  Lab 01/12/20 1746  INR 1.3*   Cardiac Enzymes: No results for input(s): CKTOTAL, CKMB, CKMBINDEX, TROPONINI in the last 168 hours. BNP (last 3 results) No results for input(s): PROBNP in the last 8760 hours. HbA1C: No results for input(s): HGBA1C in the last 72 hours. CBG: Recent Labs  Lab 01/13/20 0650 01/13/20 1154 01/13/20 1649 01/13/20 2130 01/14/20 0648  GLUCAP 109* 265* 119* 141* 106*   Lipid Profile: No results for input(s): CHOL, HDL, LDLCALC, TRIG, CHOLHDL, LDLDIRECT in the last 72 hours. Thyroid Function Tests: Recent Labs    01/12/20 1745  TSH 4.920*   Anemia Panel: No results for  input(s): VITAMINB12, FOLATE, FERRITIN, TIBC, IRON, RETICCTPCT in the last 72 hours. Sepsis Labs: Recent Labs  Lab 01/12/20 1745 01/12/20 2138 01/13/20 0321  LATICACIDVEN 0.9 0.6 0.7    Recent Results (from the past 240 hour(s))  Respiratory Panel by RT PCR (Flu A&B, Covid) - Nasopharyngeal Swab     Status: None   Collection Time: 01/12/20  5:43 PM   Specimen: Nasopharyngeal Swab  Result Value Ref Range Status   SARS Coronavirus 2 by RT PCR NEGATIVE NEGATIVE Final    Comment: (NOTE) SARS-CoV-2 target nucleic acids are NOT DETECTED. The SARS-CoV-2 RNA is generally detectable in upper respiratoy specimens during the acute phase of infection. The lowest concentration of SARS-CoV-2 viral copies this assay can detect is 131 copies/mL. A negative result does not preclude SARS-Cov-2 infection and should not be used as the sole basis for treatment or other patient management decisions.  A negative result may occur with  improper specimen collection/handling, submission of specimen other than nasopharyngeal swab, presence of viral mutation(s) within the areas targeted by this assay, and inadequate number of viral copies (<131 copies/mL). A negative result must be combined with clinical observations, patient history, and epidemiological information. The expected result is Negative. Fact Sheet for Patients:  PinkCheek.be Fact Sheet for Healthcare Providers:  GravelBags.it This test is not yet ap proved or cleared by the [place] FDA and  has been authorized for detection and/or diagnosis of SARS-CoV-2 by FDA under an Emergency Use Authorization (EUA). This EUA will remain  in effect (meaning this test can be used) for the duration of the COVID-19 declaration under Section 564(b)(1) of the Act, 21 U.S.C. section 360bbb-3(b)(1), unless the authorization is terminated or revoked sooner.    Influenza A by PCR NEGATIVE NEGATIVE  Final   Influenza B by PCR NEGATIVE NEGATIVE Final    Comment: (NOTE) The Xpert Xpress SARS-CoV-2/FLU/RSV assay is intended as an aid in  the diagnosis of influenza from Nasopharyngeal swab specimens and  should not be used as a sole basis for treatment. Nasal washings and  aspirates are unacceptable for Xpert Xpress SARS-CoV-2/FLU/RSV  testing. Fact Sheet for Patients: PinkCheek.be Fact Sheet for Healthcare Providers: GravelBags.it This test is not yet approved or cleared by the [place] FDA and  has been authorized for detection and/or diagnosis of SARS-CoV-2 by  FDA under an Emergency Use Authorization (EUA). This EUA will remain  in effect (meaning this test can be used) for the duration of the  Covid-19 declaration under Section 564(b)(1) of the Act, 21  U.S.C. section 360bbb-3(b)(1), unless the authorization is  terminated or revoked. Performed at [facility], [place] [address] [place], [place] [address]   Culture, blood (routine x 2)     Status: None (Preliminary result)   Collection Time: 01/12/20  6:08 PM   Specimen: BLOOD LEFT HAND  Result Value Ref Range Status   Specimen Description BLOOD LEFT HAND  Final   Special Requests   Final    BOTTLES DRAWN AEROBIC AND ANAEROBIC Blood Culture results may not be optimal due to an inadequate volume of blood received in culture bottles   Culture   Final    NO GROWTH 2 DAYS Performed at [facility], [place] [address] [place], [place] [address]    Report Status PENDING  Incomplete  Culture, blood (routine x 2)     Status: None (Preliminary result)   Collection Time: 01/12/20  6:20 PM   Specimen: BLOOD  Result Value Ref Range Status   Specimen Description BLOOD LEFT ANTECUBITAL  Final   Special Requests   Final    BOTTLES DRAWN AEROBIC AND ANAEROBIC Blood Culture results may not be optimal due to an inadequate volume of blood received in culture bottles    Culture   Final    NO GROWTH 2 DAYS Performed at [facility], [place] [address] [place], [place] [address]    Report Status PENDING  Incomplete    Radiology Studies: CT ABDOMEN PELVIS WO CONTRAST  Result Date: 01/12/2020 CLINICAL DATA:  Abdominal and back pain EXAM: CT ABDOMEN AND PELVIS WITHOUT CONTRAST TECHNIQUE: Multidetector CT imaging of the abdomen and pelvis was performed following the standard protocol without IV contrast. COMPARISON:  01/12/2020 FINDINGS: Lower chest: No acute pleural or parenchymal lung disease. Hepatobiliary: Gallbladder is moderately distended with small calcified gallstone. No CT evidence of cholecystitis. The  liver is unremarkable. Pancreas: Unremarkable. No pancreatic ductal dilatation or surrounding inflammatory changes. Spleen: Normal in size without focal abnormality. Adrenals/Urinary Tract: No urinary tract calculi or obstructive uropathy. Bladder is unremarkable. The adrenals are normal. Stomach/Bowel: No bowel obstruction or ileus. The cecum is midline, with a normal appendix identified. Moderate retained stool. There is wall thickening of the distal descending and sigmoid colon with mild pericolonic fat stranding, consistent with acute uncomplicated diverticulitis. No perforation, fluid collection, or abscess. Vascular/Lymphatic: Aortic atherosclerosis. No enlarged abdominal or pelvic lymph nodes. Reproductive: Uterus and bilateral adnexa are unremarkable. Other: No abdominal wall hernia or abnormality. No abdominopelvic ascites. Musculoskeletal: No acute or destructive bony lesions. Rotatory scoliosis of the thoracolumbar spine, with multilevel spondylosis. Reconstructed images demonstrate no additional findings. IMPRESSION: 1. Acute uncomplicated diverticulitis of the distal descending and sigmoid colon. No perforation, fluid collection, or abscess. 2. Cholelithiasis without cholecystitis. Electronically Signed   By: Randa Ngo M.D.   On: 01/12/2020 20:38    CT Head Wo Contrast  Result Date: 01/12/2020 CLINICAL DATA:  Altered mental status. History of diabetes and hypertension. EXAM: CT HEAD WITHOUT CONTRAST TECHNIQUE: Contiguous axial images were obtained from the base of the skull through the vertex without intravenous contrast. COMPARISON:  None. FINDINGS: Brain: Ventricles are normal in size and configuration. There is no mass, hemorrhage, edema or other evidence of acute parenchymal abnormality. No extra-axial hemorrhage. Vascular: Chronic calcified atherosclerotic changes of the large vessels at the skull base. No unexpected hyperdense vessel. Skull: Normal. Negative for fracture or focal lesion. Sinuses/Orbits: No acute finding. Other: None. IMPRESSION: Negative head CT. No intracranial mass, hemorrhage or edema. Electronically Signed   By: Franki Cabot M.D.   On: 01/12/2020 19:29   DG Chest Port 1 View  Result Date: 01/12/2020 CLINICAL DATA:  Confusion EXAM: PORTABLE CHEST 1 VIEW COMPARISON:  None. FINDINGS: The heart size and mediastinal contours are within normal limits. Both lungs are clear. The visualized skeletal structures are unremarkable. IMPRESSION: No active disease. Electronically Signed   By: Inez Catalina M.D.   On: 01/12/2020 17:59   Scheduled Meds: . amLODipine  2.5 mg Oral Daily  . DULoxetine  90 mg Oral Daily  . feeding supplement (ENSURE ENLIVE)  237 mL Oral TID BM  . heparin  5,000 Units Subcutaneous Q8H  . insulin aspart  0-9 Units Subcutaneous TID WC  . mouth rinse  15 mL Mouth Rinse BID  . multivitamin with minerals  1 tablet Oral Daily  . pantoprazole  40 mg Oral Daily  . rosuvastatin  20 mg Oral Daily   Continuous Infusions: . cefTRIAXone (ROCEPHIN)  IV 2 g (01/13/20 2012)  . metronidazole 500 mg (01/14/20 0524)    LOS: 2 days   Time spent: [place], MD [facility]  To contact the attending provider between 7A-7P or the covering provider during after hours 7P-7A, please log into the  web site [email or web address] and access using universal [place] password for that web site. If you do not have the password, please call the hospital operator.  01/14/2020, 11:00 AM

## 2020-01-14 NOTE — Evaluation (Signed)
Physical Therapy Evaluation Patient Details Name: Sabrina Rodriguez MRN: [id number] DOB: Aug 19, 1951 Today's Date: 01/14/2020   History of Present Illness  Pt is a 69 y/o female admitted secondary to confusion and diarrhea. Pt found to have acute diverticulitis. Also found to have ARF likely from dehydration. PMH includes DM and HTN.   Clinical Impression  Pt admitted secondary to problem above with deficits below. Pt requiring min A for steadying assist without use of AD. Reports she lives alone, but son can assist as needed. Educated about having son assist with her dog at d/c and educated about using RW at home to increase safety. Will continue to follow acutely to maximize functional mobility independence and safety.     Follow Up Recommendations Home health PT;Supervision for mobility/OOB    Equipment Recommendations  Rolling walker with 5" wheels    Recommendations for Other Services       Precautions / Restrictions Precautions Precautions: Fall Restrictions Weight Bearing Restrictions: No      Mobility  Bed Mobility Overal bed mobility: Modified Independent                Transfers Overall transfer level: Needs assistance Equipment used: 1 person hand held assist Transfers: Sit to/from Stand Sit to Stand: Min assist         General transfer comment: Min a for steadying assist.   Ambulation/Gait Ambulation/Gait assistance: Min assist Gait Distance (Feet): 50 Feet Assistive device: 1 person hand held assist Gait Pattern/deviations: Step-through pattern;Decreased stride length Gait velocity: Decreased   General Gait Details: Slow, mildly unsteady gait. Min A and HHA for steadying. Pt reaching for objects to hold on to. Educated about using RW at home to increase safety.   Stairs            Wheelchair Mobility    Modified Rankin (Stroke Patients Only)       Balance Overall balance assessment: Needs assistance Sitting-balance support: Feet  supported;No upper extremity supported Sitting balance-Leahy Scale: Good     Standing balance support: Single extremity supported;During functional activity Standing balance-Leahy Scale: Poor Standing balance comment: Reliant on at least 1 UE support                              Pertinent Vitals/Pain Pain Assessment: No/denies pain    Home Living Family/patient expects to be discharged to:: Private residence Living Arrangements: Alone Available Help at Discharge: Family;Available PRN/intermittently Type of Home: Apartment Home Access: Level entry     Home Layout: One level Home Equipment: None      Prior Function Level of Independence: Independent               Hand Dominance        Extremity/Trunk Assessment   Upper Extremity Assessment Upper Extremity Assessment: Defer to OT evaluation    Lower Extremity Assessment Lower Extremity Assessment: Generalized weakness    Cervical / Trunk Assessment Cervical / Trunk Assessment: Kyphotic  Communication   Communication: No difficulties  Cognition Arousal/Alertness: Awake/alert Behavior During Therapy: WFL for tasks assessed/performed Overall Cognitive Status: No family/caregiver present to determine baseline cognitive functioning                                 General Comments: Some slowed processing noted, however, alert and oriented X4.       General Comments  Exercises     Assessment/Plan    PT Assessment Patient needs continued PT services  PT Problem List Decreased strength;Decreased balance;Decreased mobility;Decreased knowledge of use of DME       PT Treatment Interventions Gait training;Functional mobility training;Therapeutic activities;DME instruction;Therapeutic exercise;Balance training;Patient/family education    PT Goals (Current goals can be found in the Care Plan section)  Acute Rehab PT Goals Patient Stated Goal: to go home PT Goal Formulation: With  patient Time For Goal Achievement: 01/28/20 Potential to Achieve Goals: Good    Frequency Min 3X/week   Barriers to discharge Decreased caregiver support      Co-evaluation               AM-PAC PT "6 Clicks" Mobility  Outcome Measure Help needed turning from your back to your side while in a flat bed without using bedrails?: None Help needed moving from lying on your back to sitting on the side of a flat bed without using bedrails?: None Help needed moving to and from a bed to a chair (including a wheelchair)?: A Little Help needed standing up from a chair using your arms (e.g., wheelchair or bedside chair)?: A Little Help needed to walk in hospital room?: A Little Help needed climbing 3-5 steps with a railing? : A Lot 6 Click Score: 19    End of Session   Activity Tolerance: Patient tolerated treatment well Patient left: in bed;with call bell/phone within reach Nurse Communication: Mobility status PT Visit Diagnosis: Unsteadiness on feet (R26.81);Muscle weakness (generalized) (M62.81)    Time: 6644-0347 PT Time Calculation (min) (ACUTE ONLY): 13 min   Charges:   PT Evaluation $PT Eval Low Complexity: 1 Low          Lou Miner, DPT  Acute Rehabilitation Services  Pager: [phone] Office: [phone]   Rudean Hitt 01/14/2020, 11:37 AM

## 2020-01-14 NOTE — Plan of Care (Signed)
  Problem: Clinical Measurements: Goal: Ability to maintain clinical measurements within normal limits will improve Outcome: Completed/Met Goal: Respiratory complications will improve Outcome: Completed/Met   Problem: Coping: Goal: Level of anxiety will decrease Outcome: Completed/Met   Problem: Pain Managment: Goal: General experience of comfort will improve Outcome: Completed/Met   Problem: Skin Integrity: Goal: Risk for impaired skin integrity will decrease Outcome: Completed/Met

## 2020-01-15 DIAGNOSIS — R410 Disorientation, unspecified: Secondary | ICD-10-CM

## 2020-01-15 DIAGNOSIS — E860 Dehydration: Secondary | ICD-10-CM

## 2020-01-15 LAB — GLUCOSE, CAPILLARY
Glucose-Capillary: 105 mg/dL — ABNORMAL HIGH (ref 70–99)
Glucose-Capillary: 270 mg/dL — ABNORMAL HIGH (ref 70–99)
Glucose-Capillary: 239 mg/dL — ABNORMAL HIGH (ref 70–99)
Glucose-Capillary: 162 mg/dL — ABNORMAL HIGH (ref 70–99)

## 2020-01-15 LAB — BASIC METABOLIC PANEL
Anion gap: 11 (ref 5–15)
BUN: 13 mg/dL (ref 8–23)
CO2: 13 mmol/L — ABNORMAL LOW (ref 22–32)
Calcium: 7.8 mg/dL — ABNORMAL LOW (ref 8.9–10.3)
Chloride: 119 mmol/L — ABNORMAL HIGH (ref 98–111)
Creatinine, Ser: 0.7 mg/dL (ref 0.44–1.00)
GFR calc Af Amer: >60 mL/min (ref >60)
GFR calc non Af Amer: >60 mL/min (ref >60)
Glucose, Bld: 107 mg/dL — ABNORMAL HIGH (ref 70–99)
Potassium: 3.3 mmol/L — ABNORMAL LOW (ref 3.5–5.1)
Sodium: 143 mmol/L (ref 135–145)

## 2020-01-15 LAB — CBC
HCT: 22.7 % — ABNORMAL LOW (ref 36.0–46.0)
Hemoglobin: 6.9 g/dL — CL (ref 12.0–15.0)
MCH: 25.1 pg — ABNORMAL LOW (ref 26.0–34.0)
MCHC: 30.4 g/dL (ref 30.0–36.0)
MCV: 82.5 fL (ref 80.0–100.0)
Platelets: 532 10*3/uL — ABNORMAL HIGH (ref 150–400)
RBC: 2.75 MIL/uL — ABNORMAL LOW (ref 3.87–5.11)
RDW: 18.3 % — ABNORMAL HIGH (ref 11.5–15.5)
WBC: 7.5 10*3/uL (ref 4.0–10.5)
nRBC: 0 % (ref 0.0–0.2)

## 2020-01-15 LAB — MAGNESIUM: Magnesium: 1.5 mg/dL — ABNORMAL LOW (ref 1.7–2.4)

## 2020-01-15 LAB — ABO/RH: ABO/RH(D): O NEG

## 2020-01-15 LAB — OCCULT BLOOD X 1 CARD TO LAB, STOOL: Fecal Occult Bld: NEGATIVE

## 2020-01-15 LAB — PREPARE RBC (CROSSMATCH)

## 2020-01-15 MED ORDER — MAGNESIUM SULFATE 2 GM/50ML IV SOLN
2.0000 g | Freq: Once | INTRAVENOUS | Status: AC
  Administered 2020-01-15: 2 g via INTRAVENOUS
  Filled 2020-01-15: qty 50

## 2020-01-15 MED ORDER — SIMETHICONE 80 MG PO CHEW
80.0000 mg | CHEWABLE_TABLET | Freq: Four times a day (QID) | ORAL | Status: DC | PRN
  Administered 2020-01-15: 80 mg via ORAL
  Filled 2020-01-15: qty 1

## 2020-01-15 MED ORDER — SODIUM CHLORIDE 0.9% IV SOLUTION: Freq: Once | INTRAVENOUS | Status: DC

## 2020-01-15 MED ORDER — POTASSIUM CHLORIDE CRYS ER 20 MEQ PO TBCR
40.0000 meq | EXTENDED_RELEASE_TABLET | Freq: Once | ORAL | Status: AC
  Administered 2020-01-15: 40 meq via ORAL
  Filled 2020-01-15: qty 2

## 2020-01-15 MED ORDER — METRONIDAZOLE 500 MG PO TABS
500.0000 mg | ORAL_TABLET | Freq: Three times a day (TID) | ORAL | Status: DC
  Administered 2020-01-15 – 2020-01-16 (×3): 500 mg via ORAL
  Filled 2020-01-15 (×3): qty 1

## 2020-01-15 NOTE — Progress Notes (Signed)
Spoke with son and updated him on patient condition. He would like the MD to talk to him tomorrow.

## 2020-01-15 NOTE — Plan of Care (Signed)
  Problem: Education: Goal: Knowledge of General Education information will improve Description: Including pain rating scale, medication(s)/side effects and non-pharmacologic comfort measures Outcome: Progressing   Problem: Health Behavior/Discharge Planning: Goal: Ability to manage health-related needs will improve Outcome: Progressing   Problem: Activity: Goal: Risk for activity intolerance will decrease Outcome: Progressing   

## 2020-01-15 NOTE — Progress Notes (Signed)
Nutrition Follow up  DOCUMENTATION CODES:   Underweight, Severe malnutrition in context of acute illness/injury  INTERVENTION:    Continue Ensure Enlive po TID, each supplement provides 350 kcal and 20 grams of protein  Continue Hormel Shake BID between meals, each supplement provides 520 kcals and 22 grams of protein  Continue MVI daily   NUTRITION DIAGNOSIS:   Severe Malnutrition related to acute illness(acute diverticulitis) as evidenced by severe fat depletion, severe muscle depletion, energy intake < or equal to 50% for > or equal to 5 days, percent weight loss.  Ongoing  GOAL:   Patient will meet greater than or equal to 90% of their needs   Progressing   MONITOR:   PO intake, Supplement acceptance, Diet advancement, TF tolerance, Weight trends, I & O's, Labs  REASON FOR ASSESSMENT:   Malnutrition Screening Tool    ASSESSMENT:   Patient with PMH significant for HTN, DM, HLD, and diverticulitis. Presents this admission with acute diverticulitis and ARF.   Appetite progressing. Pt reports she is able to eat more. Drinking Ensure 2-3 times daily. Meal completions charted as 50-100% for her last three meals.   RD provided "Fiber Restricted Nutrition Therapy" handout. Explained reasons for pt to follow a low fiber diet. Reviewed low fiber foods and high fiber foods. Discussed best practice for long term management of diverticulosis is a high fiber diet and discussed ways to gradually increase fiber in the diet. Discussed ways to increase kcal and protein to promote weight gain. Answer all pt questions.   Admission weight: 48.1 kg   Drips: Mg sulfate  Medications: MVI, SS novolog Labs: K 3.3 (L) Mg 1.5 (L) CBG 87-270  Diet Order:   Diet Order            Diet regular Room service appropriate? Yes; Fluid consistency: Thin  Diet effective now              EDUCATION NEEDS:   Education needs have been addressed  Skin:  Skin Assessment: Reviewed RN  Assessment  Last BM:  2/17  Height:   Ht Readings from Last 1 Encounters:  01/13/20 5\' 4"  (1.626 m)    Weight:   Wt Readings from Last 1 Encounters:  01/13/20 48.1 kg    Ideal Body Weight:  54.5 kg  BMI:  Body mass index is 18.21 kg/m.  Estimated Nutritional Needs:   Kcal:  1650-1850 kcal  Protein:  80-95 grams  Fluid:  >/= 1.6 L/day   01/15/20 RD, LDN Clinical Nutrition Pager listed in AMION

## 2020-01-15 NOTE — Progress Notes (Addendum)
PT Cancellation Note  Patient Details Name: Sabrina Rodriguez MRN: [id number] DOB: 1951/04/26   Cancelled Treatment:    Reason Eval/Treat Not Completed: Medical issues which prohibited therapy. Hgb 6.9. Pt scheduled to receive 1 unit PRBCs. PT to re-attempt as time allows.  Addendum 1108: Blood transfusion has not started.   Ilda Foil 01/15/2020, 8:54 AM   Aida Raider, PT  Office # [phone] Pager [phone]

## 2020-01-15 NOTE — Progress Notes (Signed)
PROGRESS NOTE    Sabrina Rodriguez  DSK:[id number] DOB: 15-Feb-1951 DOA: 01/12/2020 PCP: [name], MD   Brief Narrative:  HPI on 01/12/2020 by Dr. Midge Minium Sabrina Rodriguez is a 69 y.o. female with history of hypertension, diabetes mellitus type 2, hyperlipidemia who was recently admitted for acute diverticulitis was found to be confused by patient's and when he went to check on the patient.  Patient was brought to the ER.  At the time of my exam patient was able to give history and patient states he has been having continuous diarrhea last few days with left periumbilical pain similar to the one she had when she had diverticulitis last month.  Denies vomiting fever or chills.  Has been a watery diarrhea no blood in the diarrhea.  Interim history Admitted for acute diverticulitis, however has not had any further episodes of diarrhea since admission.  Patient currently on IV antibiotics.  Hemoglobin dropped to 6.9 this morning, will transfuse and continue to monitor closely.  FOBT pending. Assessment & Plan   Acute diverticulitis -Initially was having diarrhea however has not had any further episodes of diarrhea since admission -GI pathogen panel has not been collected again as patient has had no bowel movements -Was placed on ceftriaxone and Flagyl -Abdominal pain is improving, may consider transitioning to oral antibiotics in the next 24 hours  Acute on chronic normocytic anemia/possible blood loss anemia -FOBT pending -Hemoglobin this morning dropped to 6.9 -1 unit PRBC ordered for transfusion -Continue to monitor CBC -will discontinue heparin  Acute kidney injury -Likely secondary to GI losses and dehydration -Patient initially placed on IV fluids however this was stopped -Lisinopril held -Creatinine on admission 1.7, down to 0.7 -continue to monitor BMP  Metabolic acidosis -Likely secondary to diarrhea -slowly improving- continue to monitor   NSVT -Patient had a brief  run of SVT after admission -TSH was normal   Essential hypertension -Continue amlodipine and hydralazine as needed -Lisinopril held  Diabetes mellitus, type II -Continue insulin sliding scale and CBG monitoring  Hyperlipidemia -Continue statin  Severe protein calorie malnutrition -Patient has had significant withdrawal for last month due to poor oral intake -Nutrition consulted -Continue supplements  Hypokalemia -will replace and obtain magnesium level  Deconditioning -Likely secondary to the above, PT recommended home health, rolling walker with 5" wheels -OT recommended close supervision 24 hours initially, tub and shower seat  DVT Prophylaxis  SCDs  Code Status: Full  Family Communication: None at bedside. Attempted to reach son.  Disposition Plan: Admitted from home for Acute diverticulitis, now with anemia. Will transfuse and continue to monitor. Likely transition to oral antibiotics. Suspect home when stable.   Consultants None  Procedures  None  Antibiotics   Anti-infectives (From admission, onward)   Start     Dose/Rate Route Frequency Ordered Stop   01/15/20 1400  metroNIDAZOLE (FLAGYL) tablet 500 mg     500 mg Oral Every 8 hours 01/15/20 0822     01/13/20 2100  cefTRIAXone (ROCEPHIN) 2 g in sodium chloride 0.9 % 100 mL IVPB     2 g 200 mL/hr over 30 Minutes Intravenous Every 24 hours 01/12/20 2210     01/12/20 2130  piperacillin-tazobactam (ZOSYN) IVPB 3.375 g  Status:  Discontinued     3.375 g 100 mL/hr over 30 Minutes Intravenous  Once 01/12/20 2119 01/12/20 2121   01/12/20 2130  cefTRIAXone (ROCEPHIN) 1 g in sodium chloride 0.9 % 100 mL IVPB     1 g 200 mL/hr  over 30 Minutes Intravenous  Once 01/12/20 2122 01/12/20 2220   01/12/20 2130  metroNIDAZOLE (FLAGYL) IVPB 500 mg     500 mg 100 mL/hr over 60 Minutes Intravenous  Once 01/12/20 2122 01/13/20 0030   01/12/20 0600  metroNIDAZOLE (FLAGYL) IVPB 500 mg  Status:  Discontinued     500 mg 100 mL/hr  over 60 Minutes Intravenous Every 8 hours 01/12/20 2210 01/15/20 2248      Subjective:   Sabrina Rodriguez seen and examined today.  Patient states she has had no further diarrhea or bowel movements since admission. Feels abdominal pain is improving since admission. Denies current chest pain, shortness of breath, nausea, vomiting, dizziness, headache.  Objective:   Vitals:   01/14/20 1500 01/14/20 2000 01/15/20 0530 01/15/20 0908  BP: 120/72 121/66 124/74 123/62  Pulse: 80 87 86 91  Resp: 18 19 19 18   Temp: 98 F (36.7 C) 99.6 F (37.6 C) 98.3 F (36.8 C) 98.8 F (37.1 C)  TempSrc: Oral Oral Oral Oral  SpO2: 100% 98% 99% 99%  Weight:      Height:        Intake/Output Summary (Last 24 hours) at 01/15/2020 1047 Last data filed at 01/15/2020 0800 Gross per 24 hour  Intake 1160.43 ml  Output 350 ml  Net 810.43 ml   Filed Weights   01/13/20 0055  Weight: 48.1 kg    Exam  General: Well developed, well nourished, thin, elderly, NAD  HEENT: NCAT, mucous membranes moist.   Cardiovascular: S1 S2 auscultated, RRR  Respiratory: Clear to auscultation bilaterally   Abdomen: Soft, nontender, nondistended, + bowel sounds  Extremities: warm dry without cyanosis clubbing or edema  Neuro: AAOx3, nonfocal  Psych: Pleasant, appropriate mood and affect   Data Reviewed: I have personally reviewed following labs and imaging studies  CBC: Recent Labs  Lab 01/12/20 1746 01/12/20 1752 01/13/20 0633 01/14/20 0436 01/15/20 0651  WBC 14.3*  --  10.1 7.0 7.5  NEUTROABS 11.4*  --  7.6  --   --   HGB 9.4* 9.9* 8.5* 7.0* 6.9*  HCT 31.2* 29.0* 28.4* 22.8* 22.7*  MCV 85.2  --  83.8 81.4 82.5  PLT 716*  --  563* 474* 532*   Basic Metabolic Panel: Recent Labs  Lab 01/12/20 1746 01/12/20 1752 01/13/20 0633 01/14/20 0436 01/15/20 0651  NA 144 145 147* 143 143  K 4.0 4.0 3.4* 3.1* 3.3*  CL 116*  --  116* 118* 119*  CO2 7*  --  9* 12* 13*  GLUCOSE 110*  --  122* 123* 107*  BUN  41*  --  29* 20 13  CREATININE 1.71*  --  1.27* 0.78 0.70  CALCIUM 9.0  --  8.8* 7.9* 7.8*  MG 2.0  --  1.8  --   --   PHOS 4.4  --   --   --   --    GFR: Estimated Creatinine Clearance: 51.8 mL/min (by C-G formula based on SCr of 0.7 mg/dL). Liver Function Tests: Recent Labs  Lab 01/12/20 1746 01/13/20 0633  AST 17 11*  ALT 13 12  ALKPHOS 118 111  BILITOT 1.0 0.7  PROT 6.4* 5.6*  ALBUMIN 2.4* 2.2*   Recent Labs  Lab 01/12/20 1746  LIPASE 68*   Recent Labs  Lab 01/12/20 1745  AMMONIA 17   Coagulation Profile: Recent Labs  Lab 01/12/20 1746  INR 1.3*   Cardiac Enzymes: No results for input(s): CKTOTAL, CKMB, CKMBINDEX, TROPONINI in the last  168 hours. BNP (last 3 results) No results for input(s): PROBNP in the last 8760 hours. HbA1C: No results for input(s): HGBA1C in the last 72 hours. CBG: Recent Labs  Lab 01/14/20 0648 01/14/20 1108 01/14/20 1639 01/14/20 2052 01/15/20 0730  GLUCAP 106* 127* 115* 87 105*   Lipid Profile: No results for input(s): CHOL, HDL, LDLCALC, TRIG, CHOLHDL, LDLDIRECT in the last 72 hours. Thyroid Function Tests: Recent Labs    01/12/20 1745  TSH 4.920*   Anemia Panel: No results for input(s): VITAMINB12, FOLATE, FERRITIN, TIBC, IRON, RETICCTPCT in the last 72 hours. Urine analysis:    Component Value Date/Time   COLORURINE YELLOW 01/13/2020 0935   APPEARANCEUR CLEAR 01/13/2020 0935   LABSPEC 1.016 01/13/2020 0935   PHURINE 6.0 01/13/2020 0935   GLUCOSEU NEGATIVE 01/13/2020 0935   HGBUR SMALL (A) 01/13/2020 0935   BILIRUBINUR NEGATIVE 01/13/2020 0935   KETONESUR 20 (A) 01/13/2020 0935   PROTEINUR 30 (A) 01/13/2020 0935   NITRITE NEGATIVE 01/13/2020 0935   LEUKOCYTESUR LARGE (A) 01/13/2020 0935   Sepsis Labs: @LABRCNTIP (procalcitonin:4,lacticidven:4)  ) Recent Results (from the past 240 hour(s))  Respiratory Panel by RT PCR (Flu A&B, Covid) - Nasopharyngeal Swab     Status: None   Collection Time: 01/12/20  5:43  PM   Specimen: Nasopharyngeal Swab  Result Value Ref Range Status   SARS Coronavirus 2 by RT PCR NEGATIVE NEGATIVE Final    Comment: (NOTE) SARS-CoV-2 target nucleic acids are NOT DETECTED. The SARS-CoV-2 RNA is generally detectable in upper respiratoy specimens during the acute phase of infection. The lowest concentration of SARS-CoV-2 viral copies this assay can detect is 131 copies/mL. A negative result does not preclude SARS-Cov-2 infection and should not be used as the sole basis for treatment or other patient management decisions. A negative result may occur with  improper specimen collection/handling, submission of specimen other than nasopharyngeal swab, presence of viral mutation(s) within the areas targeted by this assay, and inadequate number of viral copies (<131 copies/mL). A negative result must be combined with clinical observations, patient history, and epidemiological information. The expected result is Negative. Fact Sheet for Patients:  01/14/20 Fact Sheet for Healthcare Providers:  https://www.moore.com/ This test is not yet ap proved or cleared by the https://www.young.biz/ FDA and  has been authorized for detection and/or diagnosis of SARS-CoV-2 by FDA under an Emergency Use Authorization (EUA). This EUA will remain  in effect (meaning this test can be used) for the duration of the COVID-19 declaration under Section 564(b)(1) of the Act, 21 U.S.C. section 360bbb-3(b)(1), unless the authorization is terminated or revoked sooner.    Influenza A by PCR NEGATIVE NEGATIVE Final   Influenza B by PCR NEGATIVE NEGATIVE Final    Comment: (NOTE) The Xpert Xpress SARS-CoV-2/FLU/RSV assay is intended as an aid in  the diagnosis of influenza from Nasopharyngeal swab specimens and  should not be used as a sole basis for treatment. Nasal washings and  aspirates are unacceptable for Xpert Xpress SARS-CoV-2/FLU/RSV  testing. Fact  Sheet for Patients: [place] Fact Sheet for Healthcare Providers: https://www.moore.com/ This test is not yet approved or cleared by the https://www.young.biz/ FDA and  has been authorized for detection and/or diagnosis of SARS-CoV-2 by  FDA under an Emergency Use Authorization (EUA). This EUA will remain  in effect (meaning this test can be used) for the duration of the  Covid-19 declaration under Section 564(b)(1) of the Act, 21  U.S.C. section 360bbb-3(b)(1), unless the authorization is  terminated or revoked.  Performed at [facility], [place] [address], [place], [place] [address]   Culture, blood (routine x 2)     Status: None (Preliminary result)   Collection Time: 01/12/20  6:08 PM   Specimen: BLOOD LEFT HAND  Result Value Ref Range Status   Specimen Description BLOOD LEFT HAND  Final   Special Requests   Final    BOTTLES DRAWN AEROBIC AND ANAEROBIC Blood Culture results may not be optimal due to an inadequate volume of blood received in culture bottles   Culture   Final    NO GROWTH 3 DAYS Performed at [facility], [place] [address], [place], [place] [address]    Report Status PENDING  Incomplete  Culture, blood (routine x 2)     Status: None (Preliminary result)   Collection Time: 01/12/20  6:20 PM   Specimen: BLOOD  Result Value Ref Range Status   Specimen Description BLOOD LEFT ANTECUBITAL  Final   Special Requests   Final    BOTTLES DRAWN AEROBIC AND ANAEROBIC Blood Culture results may not be optimal due to an inadequate volume of blood received in culture bottles   Culture   Final    NO GROWTH 3 DAYS Performed at [facility], [place] [address], [place], [place] [address]    Report Status PENDING  Incomplete      Radiology Studies: No results found.   Scheduled Meds: . sodium chloride   Intravenous Once  . amLODipine  2.5 mg Oral Daily  . DULoxetine  90 mg Oral Daily  . feeding supplement (ENSURE  ENLIVE)  237 mL Oral TID BM  . insulin aspart  0-9 Units Subcutaneous TID WC  . mouth rinse  15 mL Mouth Rinse BID  . metroNIDAZOLE  500 mg Oral Q8H  . multivitamin with minerals  1 tablet Oral Daily  . pantoprazole  40 mg Oral Daily  . rosuvastatin  20 mg Oral Daily   Continuous Infusions: . cefTRIAXone (ROCEPHIN)  IV 2 g (01/14/20 2110)     LOS: 3 days   Time Spent in minutes   45 minutes  Ella Golomb D.O. on 01/15/2020 at 10:47 AM  Between 7am to 7pm - Please see pager noted on [email or web address]  After 7pm go to [email or web address]  And look for the night coverage person covering for me after hours  [facility] Office  [phone]

## 2020-01-15 NOTE — Evaluation (Signed)
Occupational Therapy Evaluation Patient Details Name: Sabrina Rodriguez MRN: [id number] DOB: January 29, 1951 Today's Date: 01/15/2020    History of Present Illness Pt is a 69 y/o female admitted secondary to confusion and diarrhea. Pt found to have acute diverticulitis. Also found to have ARF likely from dehydration. PMH includes DM and HTN.    Clinical Impression   This 69 y/o female presents with the above. PTA pt reports independence with ADL, iADL and functional mobility, lives alone but reports son lives nearby. Pt seated EOB upon arrival to room with NT assisting with wash up. Assisted with completion of ADL tasks with pt requiring overall supervision throughout, requiring close minguard assist for short distance mobility in room to [facility] for toileting tasks. Pt denies dizziness with activity throughout. She will benefit from continued acute OT services to maximize her overall safety and independence with ADL and mobility. Do not anticipate pt will require follow up OT services at time of discharge. Will follow.     Follow Up Recommendations  No OT follow up;Supervision/Assistance - 24 hour(close to 24hr initially)    Equipment Recommendations  Tub/shower seat           Precautions / Restrictions Precautions Precautions: Fall Restrictions Weight Bearing Restrictions: No      Mobility Bed Mobility               General bed mobility comments: seated EOB upon arrival  Transfers Overall transfer level: Needs assistance Equipment used: None Transfers: Sit to/from Stand Sit to Stand: Supervision         General transfer comment: for safety, and balance, no assist required    Balance Overall balance assessment: Needs assistance Sitting-balance support: Feet supported;No upper extremity supported Sitting balance-Leahy Scale: Good     Standing balance support: Single extremity supported;During functional activity Standing balance-Leahy Scale: Fair Standing balance comment:  maintaining static standing for LB bathing at supervision level, minguard provided for room level mobility                            ADL either performed or assessed with clinical judgement   ADL Overall ADL's : Needs assistance/impaired Eating/Feeding: Modified independent;Sitting   Grooming: Modified independent;Brushing hair;Sitting;Supervision/safety;Standing Grooming Details (indicate cue type and reason): performing some in sitting and standing Upper Body Bathing: Modified independent;Sitting   Lower Body Bathing: Supervison/ safety;Sit to/from stand   Upper Body Dressing : Set up;Sitting Upper Body Dressing Details (indicate cue type and reason): for gown management Lower Body Dressing: Supervision/safety;Sit to/from stand Lower Body Dressing Details (indicate cue type and reason): pt donning socks seated EOB Toilet Transfer: Supervision/safety;Ambulation;BSC Toilet Transfer Details (indicate cue type and reason): BSC in room Toileting- Clothing Manipulation and Hygiene: Supervision/safety;Sit to/from stand;Sitting/lateral lean       Functional mobility during ADLs: Min guard                           Pertinent Vitals/Pain Pain Assessment: No/denies pain     Hand Dominance     Extremity/Trunk Assessment Upper Extremity Assessment Upper Extremity Assessment: Generalized weakness   Lower Extremity Assessment Lower Extremity Assessment: Defer to PT evaluation   Cervical / Trunk Assessment Cervical / Trunk Assessment: Kyphotic   Communication Communication Communication: HOH   Cognition Arousal/Alertness: Awake/alert Behavior During Therapy: WFL for tasks assessed/performed Overall Cognitive Status: No family/caregiver present to determine baseline cognitive functioning  General Comments: delayed processing, suspect partly due to pt is [facility]   General Comments  pt denies dizziness with  seated/standing activity (was sitting EOB upon arrival to room washing up with NT), monitored closely given low hgb this AM     Exercises     Shoulder Instructions      Home Living Family/patient expects to be discharged to:: Private residence Living Arrangements: Alone Available Help at Discharge: Family;Available PRN/intermittently Type of Home: Apartment Home Access: Level entry     Home Layout: One level     Bathroom Shower/Tub: Chief Strategy Officer: Standard     Home Equipment: None          Prior Functioning/Environment Level of Independence: Independent                 OT Problem List: Decreased strength;Impaired balance (sitting and/or standing);Decreased activity tolerance;Decreased knowledge of use of DME or AE      OT Treatment/Interventions: Self-care/ADL training;Therapeutic exercise;Energy conservation;DME and/or AE instruction;Therapeutic activities;Patient/family education;Balance training    OT Goals(Current goals can be found in the care plan section) Acute Rehab OT Goals Patient Stated Goal: to go home OT Goal Formulation: With patient Time For Goal Achievement: 01/29/20 Potential to Achieve Goals: Good  OT Frequency: Min 2X/week   Barriers to D/C:            Co-evaluation              AM-PAC OT "6 Clicks" Daily Activity     Outcome Measure Help from another person eating meals?: None Help from another person taking care of personal grooming?: A Little Help from another person toileting, which includes using toliet, bedpan, or urinal?: A Little Help from another person bathing (including washing, rinsing, drying)?: A Little Help from another person to put on and taking off regular upper body clothing?: None Help from another person to put on and taking off regular lower body clothing?: A Little 6 Click Score: 20   End of Session Nurse Communication: Mobility status  Activity Tolerance: Patient tolerated  treatment well Patient left: in chair;with call bell/phone within reach  OT Visit Diagnosis: Unsteadiness on feet (R26.81);Muscle weakness (generalized) (M62.81)                Time: 4967-5916 OT Time Calculation (min): 28 min Charges:  OT General Charges $OT Visit: 1 Visit OT Evaluation $OT Eval Moderate Complexity: 1 Mod OT Treatments $Self Care/Home Management : 8-22 mins  Marcy Siren, OT [facility] Pager [phone] Office [phone]   Orlando Penner 01/15/2020, 9:47 AM

## 2020-01-16 LAB — BASIC METABOLIC PANEL
Anion gap: 5 (ref 5–15)
BUN: 16 mg/dL (ref 8–23)
CO2: 20 mmol/L — ABNORMAL LOW (ref 22–32)
Calcium: 8 mg/dL — ABNORMAL LOW (ref 8.9–10.3)
Chloride: 117 mmol/L — ABNORMAL HIGH (ref 98–111)
Creatinine, Ser: 0.97 mg/dL (ref 0.44–1.00)
GFR calc Af Amer: >60 mL/min (ref >60)
GFR calc non Af Amer: >60 mL/min (ref >60)
Glucose, Bld: 132 mg/dL — ABNORMAL HIGH (ref 70–99)
Potassium: 4 mmol/L (ref 3.5–5.1)
Sodium: 142 mmol/L (ref 135–145)

## 2020-01-16 LAB — TYPE AND SCREEN
ABO/RH(D): O NEG
Antibody Screen: NEGATIVE
Unit division: 0

## 2020-01-16 LAB — GLUCOSE, CAPILLARY
Glucose-Capillary: 133 mg/dL — ABNORMAL HIGH (ref 70–99)
Glucose-Capillary: 129 mg/dL — ABNORMAL HIGH (ref 70–99)
Glucose-Capillary: 185 mg/dL — ABNORMAL HIGH (ref 70–99)

## 2020-01-16 LAB — BPAM RBC
Blood Product Expiration Date: 202102212359
ISSUE DATE / TIME: 202102171429
Unit Type and Rh: 9500

## 2020-01-16 LAB — HEMOGLOBIN AND HEMATOCRIT, BLOOD
HCT: 26.6 % — ABNORMAL LOW (ref 36.0–46.0)
Hemoglobin: 8.5 g/dL — ABNORMAL LOW (ref 12.0–15.0)

## 2020-01-16 MED ORDER — AMOXICILLIN-POT CLAVULANATE 875-125 MG PO TABS
1.0000 | ORAL_TABLET | Freq: Two times a day (BID) | ORAL | Status: DC
  Administered 2020-01-16 – 2020-01-17 (×3): 1 via ORAL
  Filled 2020-01-16 (×3): qty 1

## 2020-01-16 NOTE — TOC Initial Note (Signed)
Transition of Care [facility]) - Initial/Assessment Note    Patient Details  Name: Sabrina Rodriguez MRN: [id number] Date of Birth: 1951/07/04  [facility] of [facility] CM/SW Contact:    Bartholomew Crews, RN Phone Number: [phone] 01/16/2020, 1:41 PM  Clinical Narrative:                 Spoke with patient on the phone. PTA home alone with her lab. Works part time at [employer] which she enjoys, and wants to feel better so she can continue her work. Independent, no DME or HH. Agreeable to [facility] PT - discussed choice of home health agency, referral accepted by [facility] for PT.   Patient will need HH orders for PT with [facility].   PCP and pharmacy verified in [place] as correct. Anticipation transition home tomorrow pending labs and medical stability.   TOC following for transition needs.   Expected Discharge Plan: [place] Barriers to Discharge: Continued Medical Work up   Patient Goals and CMS Choice Patient states their goals for this hospitalization and ongoing recovery are:: return home and feel better CMS Medicare.gov Compare Post Acute Care list provided to:: Patient Choice offered to / list presented to : Patient  Expected Discharge Plan and Services Expected Discharge Plan: [place] In-house Referral: NA Discharge Planning Services: CM Consult Post Acute Care Choice: [place] arrangements for the past 2 months: Apartment                 DME Arranged: N/A DME Agency: NA       HH Arranged: PT [place] Agency: [facility] (formerly [employer]) Date [place]: 01/16/20 Time [place]: 66 Representative spoke with at [place]: [place]  Prior Living Arrangements/Services Living arrangements for the past 2 months: [place] with:: Self, Pets Patient language and need for interpreter reviewed:: Yes Do you feel safe going back to the place where you live?: Yes             Criminal Activity/Legal Involvement Pertinent to Current Situation/Hospitalization: No - Comment as needed  Activities of Daily Living Home Assistive Devices/Equipment: None ADL Screening (condition at time of admission) Patient's cognitive ability adequate to safely complete daily activities?: Yes Is the patient deaf or have difficulty hearing?: No Does the patient have difficulty seeing, even when wearing glasses/contacts?: No Does the patient have difficulty concentrating, remembering, or making decisions?: No Patient able to express need for assistance with ADLs?: No Does the patient have difficulty dressing or bathing?: No Independently performs ADLs?: Yes (appropriate for developmental age) Does the patient have difficulty walking or climbing stairs?: Yes Weakness of Legs: Both Weakness of Arms/Hands: None  Permission Sought/Granted                  Emotional Assessment Appearance:: Appears stated age Attitude/Demeanor/Rapport: Engaged Affect (typically observed): Accepting Orientation: : Oriented to Self, Oriented to  Time, Oriented to Place, Oriented to Situation Alcohol / Substance Use: Not Applicable Psych Involvement: No (comment)  Admission diagnosis:  ARF (acute renal failure) ([place]) [N17.9] Patient Active Problem List   Diagnosis Date Noted  . Confusion   . Dehydration   . Protein-calorie malnutrition, severe 01/14/2020  . AKI (acute kidney injury) ([place]) 01/12/2020  . Diverticulitis 12/13/2019  . Renal failure 12/13/2019  . Metabolic acidosis 63/84/6659  . Diabetes mellitus type II, non insulin dependent ([place]) 12/13/2019  . Pancreatitis 12/13/2019  .  Hypertension    PCP:  [name], MD Pharmacy:   [facility] [address]  - [address] [place] [address] Phone: [phone] Fax: [phone]     Social Determinants of Health (SDOH) Interventions    Readmission Risk Interventions No flowsheet data  found.

## 2020-01-16 NOTE — Progress Notes (Signed)
PROGRESS NOTE    Sabrina Rodriguez  XNA:[id number] DOB: [birth year] DOA: 01/12/2020 PCP: [name], MD   Brief Narrative:  HPI on 01/12/2020 by Dr. Midge Minium Layani Foronda is a 69 y.o. female with history of hypertension, diabetes mellitus type 2, hyperlipidemia who was recently admitted for acute diverticulitis was found to be confused by patient's and when he went to check on the patient.  Patient was brought to the ER.  At the time of my exam patient was able to give history and patient states he has been having continuous diarrhea last few days with left periumbilical pain similar to the one she had when she had diverticulitis last month.  Denies vomiting fever or chills.  Has been a watery diarrhea no blood in the diarrhea.  Interim history Admitted for acute diverticulitis, however has not had any further episodes of diarrhea since admission.  Patient currently on IV antibiotics.  Hemoglobin dropped to 6.9 this morning, will transfuse and continue to monitor closely.  FOBT negative. Assessment & Plan   Acute diverticulitis -Initially was having diarrhea however has not had any further episodes of diarrhea since admission. Of note, patient had similar presentation and was treated for diverticulitis in January 2021, with Cipro and Flagyl.  She was treated for total of 10 days with antibiotics. -GI pathogen panel and C. difficile had not been collected as patient had no bowel movements.  She did have a single formed stool yesterday. -Was placed on ceftriaxone and Flagyl- will transition to Augmentin today and monitor for additional 24 hours to ensure no further abdominal pin -Abdominal pain is improved, no further episodes of diarrhea.  Has not noticed any bleeding.  Acute on chronic normocytic anemia/possible blood loss anemia -Suspect delusional component as patient was receiving IV fluids for acute kidney injury -FOBT negative -Hemoglobin dropped to 6.9, patient was transfused 1 unit  PRBC -Hemoglobin today 8.5 -Monitor CBC  Acute kidney injury -Likely secondary to GI losses and dehydration -Patient initially placed on IV fluids however this was stopped -Lisinopril held -Creatinine on admission 1.7, down to 0.97 -continue to monitor BMP  Metabolic acidosis -Likely secondary to diarrhea -slowly improving- continue to monitor   NSVT -Patient had a brief run of SVT after admission -TSH was normal   Essential hypertension -Continue amlodipine and hydralazine as needed -Lisinopril held  Diabetes mellitus, type II -Continue insulin sliding scale and CBG monitoring  Hyperlipidemia -Continue statin  Severe protein calorie malnutrition -Patient has had significant withdrawal for last month due to poor oral intake -Nutrition consulted -Continue supplements  Hypokalemia/hypomagnesemia -Resolved with replacement we will continue to monitor  Deconditioning -Likely secondary to the above, PT recommended home health, rolling walker with 5" wheels -OT recommended close supervision 24 hours initially, tub and shower seat  DVT Prophylaxis  SCDs  Code Status: Full  Family Communication: None at bedside.  Discussed with son via phone on 01/15/2020  Disposition Plan: Admitted from home for Acute diverticulitis, now with anemia.  Will transition to oral antibiotics and monitor for additional 24 hours given that this is patient's second episode of diverticulitis in 2 months.  Suspect home on 01/17/2020 with home health.  Consultants None  Procedures  None  Antibiotics   Anti-infectives (From admission, onward)   Start     Dose/Rate Route Frequency Ordered Stop   01/16/20 1000  amoxicillin-clavulanate (AUGMENTIN) 875-125 MG per tablet 1 tablet     1 tablet Oral Every 12 hours 01/16/20 0904     01/15/20  1400  metroNIDAZOLE (FLAGYL) tablet 500 mg  Status:  Discontinued     500 mg Oral Every 8 hours 01/15/20 0822 01/16/20 0904   01/13/20 2100  cefTRIAXone  (ROCEPHIN) 2 g in sodium chloride 0.9 % 100 mL IVPB  Status:  Discontinued     2 g 200 mL/hr over 30 Minutes Intravenous Every 24 hours 01/12/20 2210 01/16/20 0904   01/12/20 2130  piperacillin-tazobactam (ZOSYN) IVPB 3.375 g  Status:  Discontinued     3.375 g 100 mL/hr over 30 Minutes Intravenous  Once 01/12/20 2119 01/12/20 2121   01/12/20 2130  cefTRIAXone (ROCEPHIN) 1 g in sodium chloride 0.9 % 100 mL IVPB     1 g 200 mL/hr over 30 Minutes Intravenous  Once 01/12/20 2122 01/12/20 2220   01/12/20 2130  metroNIDAZOLE (FLAGYL) IVPB 500 mg     500 mg 100 mL/hr over 60 Minutes Intravenous  Once 01/12/20 2122 01/13/20 0030   01/12/20 0600  metroNIDAZOLE (FLAGYL) IVPB 500 mg  Status:  Discontinued     500 mg 100 mL/hr over 60 Minutes Intravenous Every 8 hours 01/12/20 2210 01/15/20 7253      Subjective:   Sabrina Rodriguez seen and examined today.  Patient has had no further bowel movement since yesterday, denies any further diarrhea.  Feels abdominal pain has improved.  Denies chest pain, shortness of breath, nausea or vomiting, dizziness or headache.   Objective:   Vitals:   01/15/20 1456 01/15/20 1727 01/15/20 2036 01/16/20 0524  BP: 113/65 103/62 118/71 111/65  Pulse: (!) 105 89 92 84  Resp: 18 16 20 18   Temp: 99.4 F (37.4 C) 98.4 F (36.9 C) 99.3 F (37.4 C) 98.6 F (37 C)  TempSrc: Oral Oral Oral Oral  SpO2: 98% 96% 99% 97%  Weight:      Height:        Intake/Output Summary (Last 24 hours) at 01/16/2020 0904 Last data filed at 01/15/2020 1727 Gross per 24 hour  Intake 769 ml  Output --  Net 769 ml   Filed Weights   01/13/20 0055  Weight: 48.1 kg   Exam  General: Well developed, well nourished, thin, elderly, NAD  HEENT: NCAT,mucous membranes moist.   Cardiovascular: S1 S2 auscultated, RRR  Respiratory: Clear to auscultation bilaterally  Abdomen: Soft, nontender, nondistended, + bowel sounds  Extremities: warm dry without cyanosis clubbing or edema  Neuro:  AAOx3, nonfocal  Psych: Pleasant, appropriate mood and affect  Data Reviewed: I have personally reviewed following labs and imaging studies  CBC: Recent Labs  Lab 01/12/20 1746 01/12/20 1746 01/12/20 1752 01/13/20 0633 01/14/20 0436 01/15/20 0651 01/16/20 0442  WBC 14.3*  --   --  10.1 7.0 7.5  --   NEUTROABS 11.4*  --   --  7.6  --   --   --   HGB 9.4*   < > 9.9* 8.5* 7.0* 6.9* 8.5*  HCT 31.2*   < > 29.0* 28.4* 22.8* 22.7* 26.6*  MCV 85.2  --   --  83.8 81.4 82.5  --   PLT 716*  --   --  563* 474* 532*  --    < > = values in this interval not displayed.   Basic Metabolic Panel: Recent Labs  Lab 01/12/20 1746 01/12/20 1746 01/12/20 1752 01/13/20 01/15/20 01/14/20 0436 01/15/20 0651 01/15/20 1105 01/16/20 0442  NA 144   < > 145 147* 143 143  --  142  K 4.0   < > 4.0  3.4* 3.1* 3.3*  --  4.0  CL 116*  --   --  116* 118* 119*  --  117*  CO2 7*  --   --  9* 12* 13*  --  20*  GLUCOSE 110*  --   --  122* 123* 107*  --  132*  BUN 41*  --   --  29* 20 13  --  16  CREATININE 1.71*  --   --  1.27* 0.78 0.70  --  0.97  CALCIUM 9.0  --   --  8.8* 7.9* 7.8*  --  8.0*  MG 2.0  --   --  1.8  --   --  1.5*  --   PHOS 4.4  --   --   --   --   --   --   --    < > = values in this interval not displayed.   GFR: Estimated Creatinine Clearance: 42.7 mL/min (by C-G formula based on SCr of 0.97 mg/dL). Liver Function Tests: Recent Labs  Lab 01/12/20 1746 01/13/20 0633  AST 17 11*  ALT 13 12  ALKPHOS 118 111  BILITOT 1.0 0.7  PROT 6.4* 5.6*  ALBUMIN 2.4* 2.2*   Recent Labs  Lab 01/12/20 1746  LIPASE 68*   Recent Labs  Lab 01/12/20 1745  AMMONIA 17   Coagulation Profile: Recent Labs  Lab 01/12/20 1746  INR 1.3*   Cardiac Enzymes: No results for input(s): CKTOTAL, CKMB, CKMBINDEX, TROPONINI in the last 168 hours. BNP (last 3 results) No results for input(s): PROBNP in the last 8760 hours. HbA1C: No results for input(s): HGBA1C in the last 72 hours. CBG: Recent  Labs  Lab 01/15/20 0730 01/15/20 1128 01/15/20 1620 01/15/20 2106 01/16/20 0700  GLUCAP 105* 270* 239* 162* 133*   Lipid Profile: No results for input(s): CHOL, HDL, LDLCALC, TRIG, CHOLHDL, LDLDIRECT in the last 72 hours. Thyroid Function Tests: No results for input(s): TSH, T4TOTAL, FREET4, T3FREE, THYROIDAB in the last 72 hours. Anemia Panel: No results for input(s): VITAMINB12, FOLATE, FERRITIN, TIBC, IRON, RETICCTPCT in the last 72 hours. Urine analysis:    Component Value Date/Time   COLORURINE YELLOW 01/13/2020 0935   APPEARANCEUR CLEAR 01/13/2020 0935   LABSPEC 1.016 01/13/2020 0935   PHURINE 6.0 01/13/2020 0935   GLUCOSEU NEGATIVE 01/13/2020 0935   HGBUR SMALL (A) 01/13/2020 0935   BILIRUBINUR NEGATIVE 01/13/2020 0935   KETONESUR 20 (A) 01/13/2020 0935   PROTEINUR 30 (A) 01/13/2020 0935   NITRITE NEGATIVE 01/13/2020 0935   LEUKOCYTESUR LARGE (A) 01/13/2020 0935   Sepsis Labs: @LABRCNTIP (procalcitonin:4,lacticidven:4)  ) Recent Results (from the past 240 hour(s))  Respiratory Panel by RT PCR (Flu A&B, Covid) - Nasopharyngeal Swab     Status: None   Collection Time: 01/12/20  5:43 PM   Specimen: Nasopharyngeal Swab  Result Value Ref Range Status   SARS Coronavirus 2 by RT PCR NEGATIVE NEGATIVE Final    Comment: (NOTE) SARS-CoV-2 target nucleic acids are NOT DETECTED. The SARS-CoV-2 RNA is generally detectable in upper respiratoy specimens during the acute phase of infection. The lowest concentration of SARS-CoV-2 viral copies this assay can detect is 131 copies/mL. A negative result does not preclude SARS-Cov-2 infection and should not be used as the sole basis for treatment or other patient management decisions. A negative result may occur with  improper specimen collection/handling, submission of specimen other than nasopharyngeal swab, presence of viral mutation(s) within the areas targeted by this assay, and inadequate number  of viral copies (<131  copies/mL). A negative result must be combined with clinical observations, patient history, and epidemiological information. The expected result is Negative. Fact Sheet for Patients:  https://www.moore.com/ Fact Sheet for Healthcare Providers:  https://www.young.biz/ This test is not yet ap proved or cleared by the [place] FDA and  has been authorized for detection and/or diagnosis of SARS-CoV-2 by FDA under an Emergency Use Authorization (EUA). This EUA will remain  in effect (meaning this test can be used) for the duration of the COVID-19 declaration under Section 564(b)(1) of the Act, 21 U.S.C. section 360bbb-3(b)(1), unless the authorization is terminated or revoked sooner.    Influenza A by PCR NEGATIVE NEGATIVE Final   Influenza B by PCR NEGATIVE NEGATIVE Final    Comment: (NOTE) The Xpert Xpress SARS-CoV-2/FLU/RSV assay is intended as an aid in  the diagnosis of influenza from Nasopharyngeal swab specimens and  should not be used as a sole basis for treatment. Nasal washings and  aspirates are unacceptable for Xpert Xpress SARS-CoV-2/FLU/RSV  testing. Fact Sheet for Patients: https://www.moore.com/ Fact Sheet for Healthcare Providers: https://www.young.biz/ This test is not yet approved or cleared by the [place] FDA and  has been authorized for detection and/or diagnosis of SARS-CoV-2 by  FDA under an Emergency Use Authorization (EUA). This EUA will remain  in effect (meaning this test can be used) for the duration of the  Covid-19 declaration under Section 564(b)(1) of the Act, 21  U.S.C. section 360bbb-3(b)(1), unless the authorization is  terminated or revoked. Performed at [facility], [address] [place], [place] [address]   Culture, blood (routine x 2)     Status: None (Preliminary result)   Collection Time: 01/12/20  6:08 PM   Specimen: BLOOD LEFT HAND  Result Value  Ref Range Status   Specimen Description BLOOD LEFT HAND  Final   Special Requests   Final    BOTTLES DRAWN AEROBIC AND ANAEROBIC Blood Culture results may not be optimal due to an inadequate volume of blood received in culture bottles   Culture   Final    NO GROWTH 4 DAYS Performed at [facility], [address] [place], [place] [address]    Report Status PENDING  Incomplete  Culture, blood (routine x 2)     Status: None (Preliminary result)   Collection Time: 01/12/20  6:20 PM   Specimen: BLOOD  Result Value Ref Range Status   Specimen Description BLOOD LEFT ANTECUBITAL  Final   Special Requests   Final    BOTTLES DRAWN AEROBIC AND ANAEROBIC Blood Culture results may not be optimal due to an inadequate volume of blood received in culture bottles   Culture   Final    NO GROWTH 4 DAYS Performed at [facility], [address], [place], [place] [address]    Report Status PENDING  Incomplete      Radiology Studies: No results found.   Scheduled Meds: . sodium chloride   Intravenous Once  . amLODipine  2.5 mg Oral Daily  . amoxicillin-clavulanate  1 tablet Oral Q12H  . DULoxetine  90 mg Oral Daily  . feeding supplement (ENSURE ENLIVE)  237 mL Oral TID BM  . insulin aspart  0-9 Units Subcutaneous TID WC  . mouth rinse  15 mL Mouth Rinse BID  . multivitamin with minerals  1 tablet Oral Daily  . pantoprazole  40 mg Oral Daily  . rosuvastatin  20 mg Oral Daily  Continuous Infusions:    LOS: 4 days   Time Spent in minutes   45 minutes  Jasan Doughtie D.O. on 01/16/2020 at 9:04 AM  Between 7am to 7pm - Please see pager noted on [email or web address]  After 7pm go to [email or web address]  And look for the night coverage person covering for me after hours  [facility] Office  [phone]

## 2020-01-16 NOTE — Progress Notes (Signed)
Physical Therapy Treatment Patient Details Name: Sabrina Rodriguez MRN: [id number] DOB: [birth year] Today's Date: 01/16/2020    History of Present Illness Pt is a 69 y/o female admitted secondary to confusion and diarrhea. Pt found to have acute diverticulitis. Also found to have ARF likely from dehydration. PMH includes DM and HTN.     PT Comments    Pt was seen for mobility with careful progression both on RW and HHA, and pt feels she is close to no AD needed.  Pt has RLE weaker than LLE, 4- vs 4+ to 5.  Will work to strengthen and stabilize her, to try walks with no AD and to progress as tolerated for higher level mobility challenges.  Follow Up Recommendations  Home health PT;Supervision for mobility/OOB     Equipment Recommendations  Rolling walker with 5" wheels    Recommendations for Other Services       Precautions / Restrictions Precautions Precautions: Fall Restrictions Weight Bearing Restrictions: No    Mobility  Bed Mobility Overal bed mobility: Modified Independent                Transfers Overall transfer level: Needs assistance Equipment used: None Transfers: Sit to/from Stand Sit to Stand: Supervision;Min guard         General transfer comment: min guard initially  Ambulation/Gait Ambulation/Gait assistance: Min guard Gait Distance (Feet): 60 Feet Assistive device: 1 person hand held assist Gait Pattern/deviations: Step-through pattern;Decreased stride length;Wide base of support Gait velocity: Decreased Gait velocity interpretation: <1.31 ft/sec, indicative of household ambulator General Gait Details: worked with pt on controlling balance with HHA and with RW.     Stairs             Wheelchair Mobility    Modified Rankin (Stroke Patients Only)       Balance Overall balance assessment: Needs assistance Sitting-balance support: Feet supported;No upper extremity supported Sitting balance-Leahy Scale: Good     Standing balance  support: Bilateral upper extremity supported;Single extremity supported Standing balance-Leahy Scale: Fair                              Cognition Arousal/Alertness: Awake/alert Behavior During Therapy: WFL for tasks assessed/performed Overall Cognitive Status: No family/caregiver present to determine baseline cognitive functioning                                 General Comments: extra time to both move and respond verbally      Exercises General Exercises - Lower Extremity Ankle Circles/Pumps: AROM;5 reps Quad Sets: AROM;10 reps Heel Slides: AROM;10 reps Hip ABduction/ADduction: AROM;10 reps    General Comments General comments (skin integrity, edema, etc.): pt talked about significant wgt loss, and RLE is weaker than LLE but not sure if intake impacted      Pertinent Vitals/Pain Pain Assessment: No/denies pain    Home Living                      Prior Function            PT Goals (current goals can now be found in the care plan section) Acute Rehab PT Goals Patient Stated Goal: to go home Progress towards PT goals: Progressing toward goals    Frequency    Min 3X/week      PT Plan Current plan remains appropriate    Co-evaluation  AM-PAC PT "6 Clicks" Mobility   Outcome Measure  Help needed turning from your back to your side while in a flat bed without using bedrails?: None Help needed moving from lying on your back to sitting on the side of a flat bed without using bedrails?: A Little Help needed moving to and from a bed to a chair (including a wheelchair)?: A Little Help needed standing up from a chair using your arms (e.g., wheelchair or bedside chair)?: A Little Help needed to walk in hospital room?: A Little Help needed climbing 3-5 steps with a railing? : A Little 6 Click Score: 19    End of Session Equipment Utilized During Treatment: Gait belt Activity Tolerance: Patient tolerated treatment  well Patient left: in bed;with call bell/phone within reach Nurse Communication: Mobility status PT Visit Diagnosis: Unsteadiness on feet (R26.81);Muscle weakness (generalized) (M62.81);Difficulty in walking, not elsewhere classified (R26.2)     Time: 4360-1658 PT Time Calculation (min) (ACUTE ONLY): 34 min  Charges:  $Gait Training: 8-22 mins $Therapeutic Exercise: 8-22 mins                    Ivar Drape 01/16/2020, 3:13 PM  Samul Dada, PT MS Acute Rehab Dept. Number: [facility] [phone] and [facility] [phone]

## 2020-01-17 LAB — BASIC METABOLIC PANEL
Anion gap: 8 (ref 5–15)
BUN: 10 mg/dL (ref 8–23)
CO2: 22 mmol/L (ref 22–32)
Calcium: 8.1 mg/dL — ABNORMAL LOW (ref 8.9–10.3)
Chloride: 113 mmol/L — ABNORMAL HIGH (ref 98–111)
Creatinine, Ser: 0.77 mg/dL (ref 0.44–1.00)
GFR calc Af Amer: >60 mL/min (ref >60)
GFR calc non Af Amer: >60 mL/min (ref >60)
Glucose, Bld: 139 mg/dL — ABNORMAL HIGH (ref 70–99)
Potassium: 3.8 mmol/L (ref 3.5–5.1)
Sodium: 143 mmol/L (ref 135–145)

## 2020-01-17 LAB — GLUCOSE, CAPILLARY
Glucose-Capillary: 165 mg/dL — ABNORMAL HIGH (ref 70–99)
Glucose-Capillary: 122 mg/dL — ABNORMAL HIGH (ref 70–99)

## 2020-01-17 LAB — CULTURE, BLOOD (ROUTINE X 2)
Culture: NO GROWTH
Culture: NO GROWTH

## 2020-01-17 LAB — HEMOGLOBIN AND HEMATOCRIT, BLOOD
HCT: 27.9 % — ABNORMAL LOW (ref 36.0–46.0)
Hemoglobin: 8.8 g/dL — ABNORMAL LOW (ref 12.0–15.0)

## 2020-01-17 LAB — MAGNESIUM: Magnesium: 1.9 mg/dL (ref 1.7–2.4)

## 2020-01-17 MED ORDER — ALUM & MAG HYDROXIDE-SIMETH 200-200-20 MG/5ML PO SUSP
30.0000 mL | Freq: Four times a day (QID) | ORAL | Status: DC | PRN
  Administered 2020-01-17: 30 mL via ORAL
  Filled 2020-01-17: qty 30

## 2020-01-17 MED ORDER — ADULT MULTIVITAMIN W/MINERALS CH: 1.0000 | ORAL_TABLET | Freq: Every day | ORAL | Status: AC

## 2020-01-17 MED ORDER — ENSURE ENLIVE PO LIQD: 237.0000 mL | Freq: Three times a day (TID) | ORAL | Status: DC

## 2020-01-17 MED ORDER — AMOXICILLIN-POT CLAVULANATE 875-125 MG PO TABS: 1.0000 | ORAL_TABLET | Freq: Two times a day (BID) | ORAL | 0 refills | Status: AC

## 2020-01-17 NOTE — Progress Notes (Signed)
Occupational Therapy Treatment Patient Details Name: Sabrina Rodriguez MRN: [id number] DOB: 27-Jul-1951 Today's Date: 01/17/2020    History of present illness Pt is a 69 y/o female admitted secondary to confusion and diarrhea. Pt found to have acute diverticulitis. Also found to have ARF likely from dehydration. PMH includes DM and HTN.    OT comments  Pt. Seen for OT treatment just prior to d/c home.  LB dressing completed with S seated for donning and tying sneakers.  Declined need for any toilet or tub transfer review.  Will have assistance at home.  Pts. Ride arrived and pt. Eager to d/c home. States no other questions or concerns for ADLs at home.    Follow Up Recommendations  No OT follow up;Supervision/Assistance - 24 hour    Equipment Recommendations  Tub/shower seat    Recommendations for Other Services      Precautions / Restrictions Precautions Precautions: Fall       Mobility Bed Mobility               General bed mobility comments: seated on couch in room upon arrival  Transfers Overall transfer level: Needs assistance Equipment used: None Transfers: Sit to/from Stand Sit to Stand: Supervision         General transfer comment: min guard initially, noted to reach for arm of couch and tray table with inital sit/stand    Balance                                           ADL either performed or assessed with clinical judgement   ADL Overall ADL's : Needs assistance/impaired                     Lower Body Dressing: Supervision/safety;Sit to/from stand Lower Body Dressing Details (indicate cue type and reason): adjusting socks and donning and tying shoes   Toilet Transfer Details (indicate cue type and reason): simulated with in room ambulation, no dme used, intermittent furniture use for support with intial sit/stand         Functional mobility during ADLs: Supervision/safety General ADL Comments: pt. sit/stand with initial  use of furniture for ue support with no use of dme for in room ambulation.  pt. declined need for any review of toileting or tub transfers. reports completion prior to arrival.  eager for d/c home.     Vision       Perception     Praxis      Cognition Arousal/Alertness: Awake/alert Behavior During Therapy: WFL for tasks assessed/performed Overall Cognitive Status: No family/caregiver present to determine baseline cognitive functioning                                          Exercises     Shoulder Instructions       General Comments  pt. Reports having labradors at home that she adores, one named Sabrina Rodriguez particularly.    Pertinent Vitals/ Pain       Pain Assessment: No/denies pain  Home Living                                          Prior Functioning/Environment  Frequency  Min 2X/week        Progress Toward Goals  OT Goals(current goals can now be found in the care plan section)  Progress towards OT goals: Progressing toward goals     Plan      Co-evaluation                 AM-PAC OT "6 Clicks" Daily Activity     Outcome Measure   Help from another person eating meals?: None Help from another person taking care of personal grooming?: A Little Help from another person toileting, which includes using toliet, bedpan, or urinal?: A Little Help from another person bathing (including washing, rinsing, drying)?: A Little Help from another person to put on and taking off regular upper body clothing?: None Help from another person to put on and taking off regular lower body clothing?: A Little 6 Click Score: 20    End of Session    OT Visit Diagnosis: Unsteadiness on feet (R26.81);Muscle weakness (generalized) (M62.81)   Activity Tolerance Patient tolerated treatment well   Patient Left (on couch then standing to notify rn her ride was here for pick up)   Nurse Communication          Time:  4970-2637 OT Time Calculation (min): 8 min  Charges: OT General Charges $OT Visit: 1 Visit OT Treatments $Self Care/Home Management : 8-22 mins  Sabrina Rodriguez, COTA/L Acute Rehabilitation [phone]   Sabrina Rodriguez 01/17/2020, 12:43 PM

## 2020-01-17 NOTE — Discharge Instructions (Signed)
Dehydration, Adult Dehydration is condition in which there is not enough water or other fluids in the body. This happens when a person loses more fluids than he or she takes in. Important body parts cannot work right without the right amount of fluids. Any loss of fluids from the body can cause dehydration. Dehydration can be mild, worse, or very bad. It should be treated right away to keep it from getting very bad. What are the causes? This condition may be caused by:  Conditions that cause loss of water or other fluids, such as: ? Watery poop (diarrhea). ? Vomiting. ? Sweating a lot. ? Peeing (urinating) a lot.  Not drinking enough fluids, especially when you: ? Are ill. ? Are doing things that take a lot of energy to do.  Other illnesses and conditions, such as fever or infection.  Certain medicines, such as medicines that take extra fluid out of the body (diuretics).  Lack of safe drinking water.  Not being able to get enough water and food. What increases the risk? The following factors may make you more likely to develop this condition:  Having a long-term (chronic) illness that has not been treated the right way, such as: ? Diabetes. ? Heart disease. ? Kidney disease.  Being 65 years of age or older.  Having a disability.  Living in a place that is high above the ground or sea (high in altitude). The thinner, dried air causes more fluid loss.  Doing exercises that put stress on your body for a long time. What are the signs or symptoms? Symptoms of dehydration depend on how bad it is. Mild or worse dehydration  Thirst.  Dry lips or dry mouth.  Feeling dizzy or light-headed, especially when you stand up from sitting.  Muscle cramps.  Your body making: ? Dark pee (urine). Pee may be the color of tea. ? Less pee than normal. ? Less tears than normal.  Headache. Very bad dehydration  Changes in skin. Skin may: ? Be cold to the touch (clammy). ? Be blotchy  or pale. ? Not go back to normal right after you lightly pinch it and let it go.  Little or no tears, pee, or sweat.  Changes in vital signs, such as: ? Fast breathing. ? Low blood pressure. ? Weak pulse. ? Pulse that is more than 100 beats a minute when you are sitting still.  Other changes, such as: ? Feeling very thirsty. ? Eyes that look hollow (sunken). ? Cold hands and feet. ? Being mixed up (confused). ? Being very tired (lethargic) or having trouble waking from sleep. ? Short-term weight loss. ? Loss of consciousness. How is this treated? Treatment for this condition depends on how bad it is. Treatment should start right away. Do not wait until your condition gets very bad. Very bad dehydration is an emergency. You will need to go to a hospital.  Mild or worse dehydration can be treated at home. You may be asked to: ? Drink more fluids. ? Drink an oral rehydration solution (ORS). This drink helps get the right amounts of fluids and salts and minerals in the blood (electrolytes).  Very bad dehydration can be treated: ? With fluids through an IV tube. ? By getting normal levels of salts and minerals in your blood. This is often done by giving salts and minerals through a tube. The tube is passed through your nose and into your stomach. ? By treating the root cause. Follow these instructions at   home: Oral rehydration solution If told by your doctor, drink an ORS:  Make an ORS. Use instructions on the package.  Start by drinking small amounts, about  cup (120 mL) every 5-10 minutes.  Slowly drink more until you have had the amount that your doctor said to have. Eating and drinking         Drink enough clear fluid to keep your pee pale yellow. If you were told to drink an ORS, finish the ORS first. Then, start slowly drinking other clear fluids. Drink fluids such as: ? Water. Do not drink only water. Doing that can make the salt (sodium) level in your body get too  low. ? Water from ice chips you suck on. ? Fruit juice that you have added water to (diluted). ? Low-calorie sports drinks.  Eat foods that have the right amounts of salts and minerals, such as: ? Bananas. ? Oranges. ? Potatoes. ? Tomatoes. ? Spinach.  Do not drink alcohol.  Avoid: ? Drinks that have a lot of sugar. These include:  High-calorie sports drinks.  Fruit juice that you did not add water to.  Soda.  Caffeine. ? Foods that are greasy or have a lot of fat or sugar. General instructions  Take over-the-counter and prescription medicines only as told by your doctor.  Do not take salt tablets. Doing that can make the salt level in your body get too high.  Return to your normal activities as told by your doctor. Ask your doctor what activities are safe for you.  Keep all follow-up visits as told by your doctor. This is important. Contact a doctor if:  You have pain in your belly (abdomen) and the pain: ? Gets worse. ? Stays in one place.  You have a rash.  You have a stiff neck.  You get angry or annoyed (irritable) more easily than normal.  You are more tired or have a harder time waking than normal.  You feel: ? Weak or dizzy. ? Very thirsty. Get help right away if you have:  Any symptoms of very bad dehydration.  Symptoms of vomiting, such as: ? You cannot eat or drink without vomiting. ? Your vomiting gets worse or does not go away. ? Your vomit has blood or green stuff in it.  Symptoms that get worse with treatment.  A fever.  A very bad headache.  Problems with peeing or pooping (having a bowel movement), such as: ? Watery poop that gets worse or does not go away. ? Blood in your poop (stool). This may cause poop to look black and tarry. ? Not peeing in 6-8 hours. ? Peeing only a small amount of very dark pee in 6-8 hours.  Trouble breathing. These symptoms may be an emergency. Do not wait to see if the symptoms will go away. Get  medical help right away. Call your local emergency services (911 in the [place] Do not drive yourself to the hospital. Summary  Dehydration is a condition in which there is not enough water or other fluids in the body. This happens when a person loses more fluids than he or she takes in.  Treatment for this condition depends on how bad it is. Treatment should be started right away. Do not wait until your condition gets very bad.  Drink enough clear fluid to keep your pee pale yellow. If you were told to drink an oral rehydration solution (ORS), finish the ORS first. Then, start slowly drinking other clear fluids.  Take over-the-counter and prescription medicines only as told by your doctor.  Get help right away if you have any symptoms of very bad dehydration. This information is not intended to replace advice given to you by your health care provider. Make sure you discuss any questions you have with your health care provider. Document Revised: 06/27/2019 Document Reviewed: 06/27/2019 Elsevier Patient Education  2020 [employer].  Diverticulitis  Diverticulitis is when small pockets in your large intestine (colon) get infected or swollen. This causes stomach pain and watery poop (diarrhea). These pouches are called diverticula. They form in people who have a condition called diverticulosis. Follow these instructions at home: Medicines  Take over-the-counter and prescription medicines only as told by your doctor. These include: ? Antibiotics. ? Pain medicines. ? Fiber pills. ? Probiotics. ? Stool softeners.  Do not drive or use heavy machinery while taking prescription pain medicine.  If you were prescribed an antibiotic, take it as told. Do not stop taking it even if you feel better. General instructions   Follow a diet as told by your doctor.  When you feel better, your doctor may tell you to change your diet. You may need to eat a lot of fiber. Fiber makes it easier to poop  (have bowel movements). Healthy foods with fiber include: ? Berries. ? Beans. ? Lentils. ? Green vegetables.  Exercise 3 or more times a week. Aim for 30 minutes each time. Exercise enough to sweat and make your heart beat faster.  Keep all follow-up visits as told. This is important. You may need to have an exam of the large intestine. This is called a colonoscopy. Contact a doctor if:  Your pain does not get better.  You have a hard time eating or drinking.  You are not pooping like normal. Get help right away if:  Your pain gets worse.  Your problems do not get better.  Your problems get worse very fast.  You have a fever.  You throw up (vomit) more than one time.  You have poop that is: ? Bloody. ? Black. ? Tarry. Summary  Diverticulitis is when small pockets in your large intestine (colon) get infected or swollen.  Take medicines only as told by your doctor.  Follow a diet as told by your doctor. This information is not intended to replace advice given to you by your health care provider. Make sure you discuss any questions you have with your health care provider. Document Revised: 10/27/2017 Document Reviewed: 12/01/2016 Elsevier Patient Education  2020 [employer].

## 2020-01-17 NOTE — Discharge Summary (Signed)
Physician Discharge Summary  Sabrina Rodriguez CHE:[id number] DOB: 02-28-1951 DOA: 01/12/2020  PCP: [name], MD  Admit date: 01/12/2020 Discharge date: 01/17/2020  Time spent: 45 minutes  Recommendations for Outpatient Follow-up:  Patient will be discharged to home with home health physical therapy.  Patient will need to follow up with primary care provider within one week of discharge, repeat CBC and BMP.  Follow up with gastroenterology as scheduled. Patient should continue medications as prescribed.  Patient should follow a heart health/carb modified diet.   Discharge Diagnoses:  Acute diverticulitis Acute on chronic normocytic anemia/possible blood loss anemia Acute kidney injury Metabolic acidosis NSVT Essential hypertension Diabetes mellitus, type II Hyperlipidemia Severe protein calorie malnutrition Hypokalemia/hypomagnesemia Deconditioning  Discharge Condition: Stable  Diet recommendation: heart healthy/carb modified  Filed Weights   01/13/20 0055  Weight: 48.1 kg    History of present illness:  on 01/12/2020 by Dr. Midge Minium [name] a 69 y.o.femalewithhistory of hypertension, diabetes mellitus type 2, hyperlipidemia who was recently admitted for acute diverticulitis was found to be confused by patient's and when he went to check on the patient. Patient was brought to the ER. At the time of my exam patient was able to give history and patient states he has been having continuous diarrhea last few days with left periumbilical pain similar to the one she had when she had diverticulitis last month. Denies vomiting fever or chills. Has been a watery diarrhea no blood in the diarrhea.  Hospital Course:  Acute diverticulitis -Initially was having diarrhea however has not had any further episodes of diarrhea since admission. Of note, patient had similar presentation and was treated for diverticulitis in January 2021, with Cipro and Flagyl.  She was  treated for total of 10 days with antibiotics. -GI pathogen panel and C. difficile had not been collected as patient had no bowel movements.  She did have a single formed stool yesterday. -Was placed on ceftriaxone and Flagyl- transitioned to Augmentin  -Abdominal pain is improved, no further episodes of diarrhea.  Has not noticed any bleeding. -Patient to follow up with [facility] as scheduled for colonoscopy.  Acute on chronic normocytic anemia/possible blood loss anemia -Suspect delusional component as patient was receiving IV fluids for acute kidney injury -FOBT negative -Hemoglobin dropped to 6.9, patient was transfused 1 unit PRBC -Hemoglobin today 8.8 -repeat CBC in one week  Acute kidney injury -resolved, Likely secondary to GI losses and dehydration -Patient initially placed on IV fluids however this was stopped -Lisinopril held- may resume on discharge -Creatinine on admission 1.7, down to 0.77  Metabolic acidosis -Resolved, Likely secondary to diarrhea  NSVT -Patient had a brief run of SVT after admission -TSH was normal   Essential hypertension -Continue amlodipine and hydralazine as needed -Lisinopril held- may resume on discharge  Diabetes mellitus, type II -Continue metformin on discharge -last hemoglobin A1c 6.5 on 12/14/2019  Hyperlipidemia -Continue statin  Severe protein calorie malnutrition -Patient has had significant withdrawal for last month due to poor oral intake -Nutrition consulted -Continue supplements  Hypokalemia/hypomagnesemia -Resolved with replacement  -repeat in one week  Deconditioning -Likely secondary to the above, PT recommended home health, rolling walker with 5" wheels -OT recommended close supervision 24 hours initially, tub and shower seat -TOC consulted, declined DME, but agrees to HHPT  Procedures: None  Consultations: None  Discharge Exam: Vitals:   01/16/20 2155 01/17/20 0510  BP: 111/71 115/66  Pulse: 83  80  Resp: 17 18  Temp: 98.3 F (36.8 C)  98.9 F (37.2 C)  SpO2: 100% 98%     General: Well developed, well nourished, thin, elderly, NAD  HEENT: NCAT, mucous membranes moist.  Cardiovascular: S1 S2 auscultated, RRR  Respiratory: Clear to auscultation bilaterally  Abdomen: Soft, nontender, nondistended, + bowel sounds  Extremities: warm dry without cyanosis clubbing or edema  Neuro: AAOx3, nonfocal  Psych: Appropriate mood and affect, pleasant   Discharge Instructions  Allergies as of 01/17/2020   No Known Allergies     Medication List    TAKE these medications   ALPRAZolam 0.25 MG tablet Commonly known as: XANAX Take 0.25 mg by mouth 2 (two) times daily as needed for anxiety or sleep.   amLODipine 2.5 MG tablet Commonly known as: NORVASC Take 2.5 mg by mouth daily.   amoxicillin-clavulanate 875-125 MG tablet Commonly known as: AUGMENTIN Take 1 tablet by mouth every 12 (twelve) hours for 5 days.   DULoxetine 30 MG capsule Commonly known as: CYMBALTA Take 90 mg by mouth daily.   feeding supplement (ENSURE ENLIVE) Liqd Take 237 mLs by mouth 3 (three) times daily between meals.   Iron (Ferrous Sulfate) 325 (65 Fe) MG Tabs Take 325 mg by mouth 2 (two) times daily.   lisinopril 20 MG tablet Commonly known as: ZESTRIL Take 20 mg by mouth daily.   metFORMIN 500 MG tablet Commonly known as: GLUCOPHAGE Take 500 mg by mouth at bedtime.   multivitamin with minerals Tabs tablet Take 1 tablet by mouth daily.   omeprazole 40 MG capsule Commonly known as: PRILOSEC Take 40 mg by mouth daily.   ondansetron 8 MG tablet Commonly known as: ZOFRAN Take 8 mg by mouth 3 (three) times daily.   psyllium 95 % Pack Commonly known as: HYDROCIL/METAMUCIL Take 1 packet by mouth 2 (two) times daily.   rosuvastatin 20 MG tablet Commonly known as: CRESTOR Take 20 mg by mouth daily.   tiZANidine 4 MG tablet Commonly known as: ZANAFLEX Take 4 mg by mouth every 8  (eight) hours as needed for muscle spasms.      No Known Allergies Follow-up Information    [facility] Follow up.   Specialty: [place] Why: the office will call to schedule physical therapy visits Contact information: [address] [place] [address] [phone]        Carol Ada, MD. Schedule an appointment as soon as possible for a visit in 1 week(s).   Specialty: Family Medicine Why: Hospital follow up Contact information: [address] [address] [phone]        Gastroenterology, [facility] [place]. Schedule an appointment as soon as possible for a visit in 1 week(s).   Why: Hospital follow up Contact information: [place] [address] [phone]            The results of significant diagnostics from this hospitalization (including imaging, microbiology, ancillary and laboratory) are listed below for reference.    Significant Diagnostic Studies: CT ABDOMEN PELVIS WO CONTRAST  Result Date: 01/12/2020 CLINICAL DATA:  Abdominal and back pain EXAM: CT ABDOMEN AND PELVIS WITHOUT CONTRAST TECHNIQUE: Multidetector CT imaging of the abdomen and pelvis was performed following the standard protocol without IV contrast. COMPARISON:  01/12/2020 FINDINGS: Lower chest: No acute pleural or parenchymal lung disease. Hepatobiliary: Gallbladder is moderately distended with small calcified gallstone. No CT evidence of cholecystitis. The liver is unremarkable. Pancreas: Unremarkable. No pancreatic ductal dilatation or surrounding inflammatory changes. Spleen: Normal in  size without focal abnormality. Adrenals/Urinary Tract: No urinary tract calculi or obstructive uropathy. Bladder is unremarkable. The adrenals are normal. Stomach/Bowel: No bowel obstruction or ileus. The cecum is midline, with a normal appendix identified. Moderate retained stool. There is wall thickening of the distal descending and sigmoid  colon with mild pericolonic fat stranding, consistent with acute uncomplicated diverticulitis. No perforation, fluid collection, or abscess. Vascular/Lymphatic: Aortic atherosclerosis. No enlarged abdominal or pelvic lymph nodes. Reproductive: Uterus and bilateral adnexa are unremarkable. Other: No abdominal wall hernia or abnormality. No abdominopelvic ascites. Musculoskeletal: No acute or destructive bony lesions. Rotatory scoliosis of the thoracolumbar spine, with multilevel spondylosis. Reconstructed images demonstrate no additional findings. IMPRESSION: 1. Acute uncomplicated diverticulitis of the distal descending and sigmoid colon. No perforation, fluid collection, or abscess. 2. Cholelithiasis without cholecystitis. Electronically Signed   By: Sharlet Salina M.D.   On: 01/12/2020 20:38   CT Head Wo Contrast  Result Date: 01/12/2020 CLINICAL DATA:  Altered mental status. History of diabetes and hypertension. EXAM: CT HEAD WITHOUT CONTRAST TECHNIQUE: Contiguous axial images were obtained from the base of the skull through the vertex without intravenous contrast. COMPARISON:  None. FINDINGS: Brain: Ventricles are normal in size and configuration. There is no mass, hemorrhage, edema or other evidence of acute parenchymal abnormality. No extra-axial hemorrhage. Vascular: Chronic calcified atherosclerotic changes of the large vessels at the skull base. No unexpected hyperdense vessel. Skull: Normal. Negative for fracture or focal lesion. Sinuses/Orbits: No acute finding. Other: None. IMPRESSION: Negative head CT. No intracranial mass, hemorrhage or edema. Electronically Signed   By: Bary Richard M.D.   On: 01/12/2020 19:29   DG Chest Port 1 View  Result Date: 01/12/2020 CLINICAL DATA:  Confusion EXAM: PORTABLE CHEST 1 VIEW COMPARISON:  None. FINDINGS: The heart size and mediastinal contours are within normal limits. Both lungs are clear. The visualized skeletal structures are unremarkable. IMPRESSION: No  active disease. Electronically Signed   By: Alcide Clever M.D.   On: 01/12/2020 17:59    Microbiology: Recent Results (from the past 240 hour(s))  Respiratory Panel by RT PCR (Flu A&B, Covid) - Nasopharyngeal Swab     Status: None   Collection Time: 01/12/20  5:43 PM   Specimen: Nasopharyngeal Swab  Result Value Ref Range Status   SARS Coronavirus 2 by RT PCR NEGATIVE NEGATIVE Final    Comment: (NOTE) SARS-CoV-2 target nucleic acids are NOT DETECTED. The SARS-CoV-2 RNA is generally detectable in upper respiratoy specimens during the acute phase of infection. The lowest concentration of SARS-CoV-2 viral copies this assay can detect is 131 copies/mL. A negative result does not preclude SARS-Cov-2 infection and should not be used as the sole basis for treatment or other patient management decisions. A negative result may occur with  improper specimen collection/handling, submission of specimen other than nasopharyngeal swab, presence of viral mutation(s) within the areas targeted by this assay, and inadequate number of viral copies (<131 copies/mL). A negative result must be combined with clinical observations, patient history, and epidemiological information. The expected result is Negative. Fact Sheet for Patients:  https://www.moore.com/ Fact Sheet for Healthcare Providers:  https://www.young.biz/ This test is not yet ap proved or cleared by the [place] FDA and  has been authorized for detection and/or diagnosis of SARS-CoV-2 by FDA under an Emergency Use Authorization (EUA). This EUA will remain  in effect (meaning this test can be used) for the duration of the COVID-19 declaration under Section 564(b)(1) of the Act, 21 U.S.C. section 360bbb-3(b)(1), unless the authorization  is terminated or revoked sooner.    Influenza A by PCR NEGATIVE NEGATIVE Final   Influenza B by PCR NEGATIVE NEGATIVE Final    Comment: (NOTE) The Xpert Xpress  SARS-CoV-2/FLU/RSV assay is intended as an aid in  the diagnosis of influenza from Nasopharyngeal swab specimens and  should not be used as a sole basis for treatment. Nasal washings and  aspirates are unacceptable for Xpert Xpress SARS-CoV-2/FLU/RSV  testing. Fact Sheet for Patients: https://www.moore.com/ Fact Sheet for Healthcare Providers: https://www.young.biz/ This test is not yet approved or cleared by the [place] FDA and  has been authorized for detection and/or diagnosis of SARS-CoV-2 by  FDA under an Emergency Use Authorization (EUA). This EUA will remain  in effect (meaning this test can be used) for the duration of the  Covid-19 declaration under Section 564(b)(1) of the Act, 21  U.S.C. section 360bbb-3(b)(1), unless the authorization is  terminated or revoked. Performed at [facility], [address], [place], [place] [address]   Culture, blood (routine x 2)     Status: None   Collection Time: 01/12/20  6:08 PM   Specimen: BLOOD LEFT HAND  Result Value Ref Range Status   Specimen Description BLOOD LEFT HAND  Final   Special Requests   Final    BOTTLES DRAWN AEROBIC AND ANAEROBIC Blood Culture results may not be optimal due to an inadequate volume of blood received in culture bottles   Culture   Final    NO GROWTH 5 DAYS Performed at [facility], [address] [place], [place] [address]    Report Status 01/17/2020 FINAL  Final  Culture, blood (routine x 2)     Status: None   Collection Time: 01/12/20  6:20 PM   Specimen: BLOOD  Result Value Ref Range Status   Specimen Description BLOOD LEFT ANTECUBITAL  Final   Special Requests   Final    BOTTLES DRAWN AEROBIC AND ANAEROBIC Blood Culture results may not be optimal due to an inadequate volume of blood received in culture bottles   Culture   Final    NO GROWTH 5 DAYS Performed at [facility], [address], [place], [place] [address]    Report  Status 01/17/2020 FINAL  Final     Labs: Basic Metabolic Panel: Recent Labs  Lab 01/12/20 1746 01/12/20 1752 01/13/20 6269 01/14/20 0436 01/15/20 0651 01/15/20 1105 01/16/20 0442 01/17/20 0411  NA 144   < > 147* 143 143  --  142 143  K 4.0   < > 3.4* 3.1* 3.3*  --  4.0 3.8  CL 116*   < > 116* 118* 119*  --  117* 113*  CO2 7*   < > 9* 12* 13*  --  20* 22  GLUCOSE 110*   < > 122* 123* 107*  --  132* 139*  BUN 41*   < > 29* 20 13  --  16 10  CREATININE 1.71*   < > 1.27* 0.78 0.70  --  0.97 0.77  CALCIUM 9.0   < > 8.8* 7.9* 7.8*  --  8.0* 8.1*  MG 2.0  --  1.8  --   --  1.5*  --  1.9  PHOS 4.4  --   --   --   --   --   --   --    < > = values in this interval not displayed.   Liver Function Tests: Recent Labs  Lab  01/12/20 1746 01/13/20 0633  AST 17 11*  ALT 13 12  ALKPHOS 118 111  BILITOT 1.0 0.7  PROT 6.4* 5.6*  ALBUMIN 2.4* 2.2*   Recent Labs  Lab 01/12/20 1746  LIPASE 68*   Recent Labs  Lab 01/12/20 1745  AMMONIA 17   CBC: Recent Labs  Lab 01/12/20 1746 01/12/20 1752 01/13/20 0633 01/14/20 0436 01/15/20 0651 01/16/20 0442 01/17/20 0411  WBC 14.3*  --  10.1 7.0 7.5  --   --   NEUTROABS 11.4*  --  7.6  --   --   --   --   HGB 9.4*   < > 8.5* 7.0* 6.9* 8.5* 8.8*  HCT 31.2*   < > 28.4* 22.8* 22.7* 26.6* 27.9*  MCV 85.2  --  83.8 81.4 82.5  --   --   PLT 716*  --  563* 474* 532*  --   --    < > = values in this interval not displayed.   Cardiac Enzymes: No results for input(s): CKTOTAL, CKMB, CKMBINDEX, TROPONINI in the last 168 hours. BNP: BNP (last 3 results) Recent Labs    01/12/20 1745  BNP 138.5*    ProBNP (last 3 results) No results for input(s): PROBNP in the last 8760 hours.  CBG: Recent Labs  Lab 01/16/20 0700 01/16/20 1130 01/16/20 1622 01/17/20 0148 01/17/20 0718  GLUCAP 133* 129* 185* 165* 122*       Signed:  Avalynne Diver  [facility] 01/17/2020, 7:49 AM

## 2020-01-17 NOTE — TOC Transition Note (Signed)
Transition of Care [facility]) - CM/SW Discharge Note   Patient Details  Name: Sabrina Rodriguez MRN: [id number] Date of Birth: 1951/05/06  Transition of [facility]) CM/SW Contact:  Lawerance Sabal, RN Phone Number: 01/17/2020, 8:25 AM   Clinical Narrative:    Notified [facility] that patient will DC today. Spoke w patient on the phone, she declined needing RW and 3/1.       Barriers to Discharge: Continued Medical Work up   Patient Goals and CMS Choice Patient states their goals for this hospitalization and ongoing recovery are:: return home and feel better CMS Medicare.gov Compare Post Acute Care list provided to:: Patient Choice offered to / list presented to : Patient  Discharge Placement                       Discharge Plan and Services In-house Referral: NA Discharge Planning Services: CM Consult Post Acute Care Choice: Home Health          DME Arranged: N/A DME Agency: NA       HH Arranged: PT HH Agency: [facility] Date HH Agency Contacted: 01/16/20 Time HH Agency Contacted: [phone] Representative spoke with at [facility] Agency: Tiffany  Social Determinants of Health (SDOH) Interventions     Readmission Risk Interventions No flowsheet data found.

## 2020-01-17 NOTE — Plan of Care (Signed)
  Problem: Education: Goal: Knowledge of General Education information will improve Description Including pain rating scale, medication(s)/side effects and non-pharmacologic comfort measures Outcome: Progressing   

## 2020-01-17 NOTE — Progress Notes (Signed)
Sabrina Rodriguez to be discharged Home per MD order. Discussed prescriptions and follow up appointments with the patient. Prescriptions given to patient; medication list explained in detail. Patient verbalized understanding.  Skin clean, dry and intact without evidence of skin break down, no evidence of skin tears noted. IV catheter discontinued intact. Site without signs and symptoms of complications. Dressing and pressure applied. Pt denies pain at the site currently. No complaints noted.  Patient free of lines, drains, and wounds.   An After Visit Summary (AVS) was printed and given to the patient. Patient escorted via wheelchair, and discharged home via private auto.  Shanna Cisco, RN

## 2020-01-30 ENCOUNTER — Ambulatory Visit: Payer: Medicare Other | Attending: Internal Medicine

## 2020-01-30 DIAGNOSIS — Z23 Encounter for immunization: Secondary | ICD-10-CM | POA: Insufficient documentation

## 2020-01-30 NOTE — Progress Notes (Signed)
   Covid-19 Vaccination Clinic  Name:  Sabrina Rodriguez    MRN: [id number] DOB: [birth year]  01/30/2020  [name] was observed post Covid-19 immunization for 15 minutes without incident. She was provided with Vaccine Information Sheet and instruction to access the V-Safe system.   [name] was instructed to call 911 with any severe reactions post vaccine: Marland Kitchen Difficulty breathing  . Swelling of face and throat  . A fast heartbeat  . A bad rash all over body  . Dizziness and weakness

## 2020-02-25 ENCOUNTER — Ambulatory Visit: Payer: Medicare Other | Attending: Internal Medicine

## 2020-02-25 DIAGNOSIS — Z23 Encounter for immunization: Secondary | ICD-10-CM

## 2020-02-25 NOTE — Progress Notes (Signed)
   Covid-19 Vaccination Clinic  Name:  Sabrina Rodriguez    MRN: [id number] DOB: March 16, 1951  02/25/2020  [name] was observed post Covid-19 immunization for 15 minutes without incident. She was provided with Vaccine Information Sheet and instruction to access the V-Safe system.   Ms. Wisdom was instructed to call 911 with any severe reactions post vaccine: Marland Kitchen Difficulty breathing  . Swelling of face and throat  . A fast heartbeat  . A bad rash all over body  . Dizziness and weakness   Immunizations Administered    Name Date Dose VIS Date Route   Pfizer COVID-19 Vaccine 02/25/2020  3:14 PM 0.3 mL 11/08/2019 Intramuscular   Manufacturer: [employer], [employer]   Lot: [id number]   NDC: [address]-9

## 2020-07-02 ENCOUNTER — Other Ambulatory Visit: Payer: Self-pay | Admitting: Gastroenterology

## 2020-07-02 DIAGNOSIS — D500 Iron deficiency anemia secondary to blood loss (chronic): Secondary | ICD-10-CM

## 2020-07-02 DIAGNOSIS — R933 Abnormal findings on diagnostic imaging of other parts of digestive tract: Secondary | ICD-10-CM

## 2020-07-02 DIAGNOSIS — R109 Unspecified abdominal pain: Secondary | ICD-10-CM

## 2020-07-02 DIAGNOSIS — R1084 Generalized abdominal pain: Secondary | ICD-10-CM

## 2020-07-17 ENCOUNTER — Other Ambulatory Visit: Payer: Self-pay

## 2020-07-17 ENCOUNTER — Ambulatory Visit
Admission: RE | Admit: 2020-07-17 | Discharge: 2020-07-17 | Disposition: A | Discharge status: Home / Self Care | Payer: Medicare Other | Source: Ambulatory Visit | Attending: Gastroenterology | Admitting: Gastroenterology

## 2020-07-17 DIAGNOSIS — D500 Iron deficiency anemia secondary to blood loss (chronic): Secondary | ICD-10-CM

## 2020-07-17 DIAGNOSIS — R933 Abnormal findings on diagnostic imaging of other parts of digestive tract: Secondary | ICD-10-CM

## 2020-07-17 DIAGNOSIS — R109 Unspecified abdominal pain: Secondary | ICD-10-CM

## 2020-07-17 DIAGNOSIS — R1084 Generalized abdominal pain: Secondary | ICD-10-CM

## 2020-07-17 IMAGING — CT CT ABD-PELV W/ CM
1 of 3 series · 12 of 32 positions shown, 18 images · IV contrast (APPLIED)
Comparison: [DATE]

CLINICAL DATA: Abdominal pain, chronic iron deficiency

EXAM:
CT ABDOMEN AND PELVIS WITH CONTRAST
TECHNIQUE: Multidetector CT imaging of the abdomen and pelvis was performed
using the standard protocol following bolus administration of
intravenous contrast.
Creatinine was obtained on site at [HOSPITAL] at [HOSPITAL].
Results: Creatinine 1.5 mg/dL.
CONTRAST:  100mL [FY] IOPAMIDOL ([FY]) INJECTION 61%

[Series 2: abd/pelvis w/cm · axial · 0.66mm/px · z∈[-481,-81]mm · 12 of 94 slices shown, 18 images]
[im 7/94  soft-tissue]
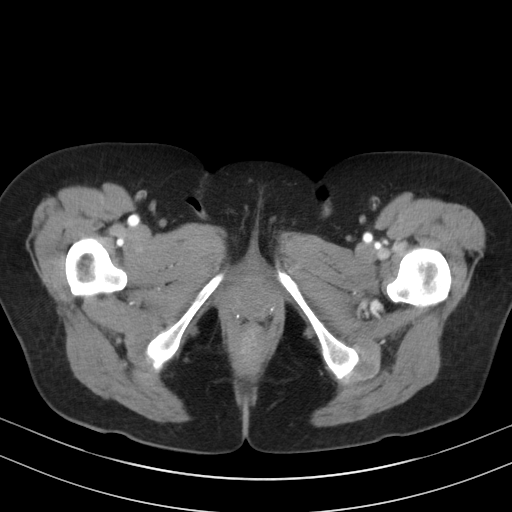
[im 7/94  bone]
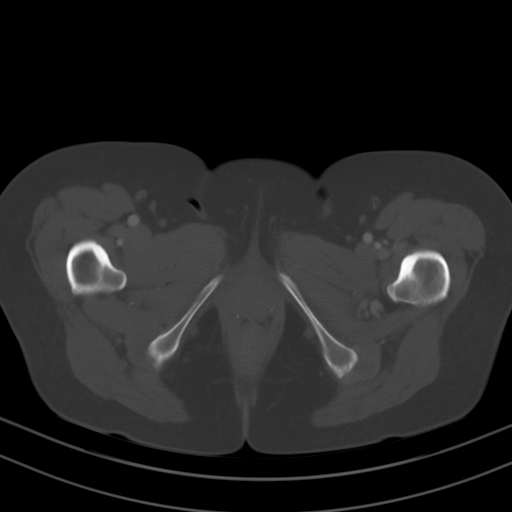
[im 14/94  soft-tissue]
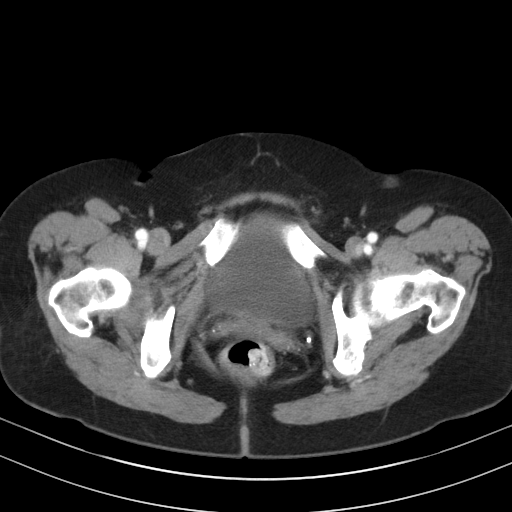
[im 20/94  soft-tissue]
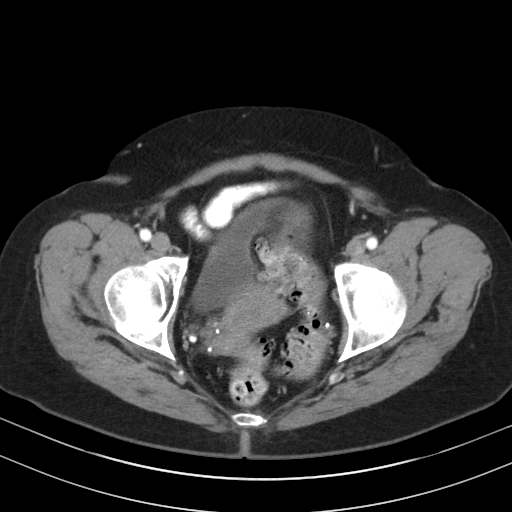
[im 27/94  soft-tissue]
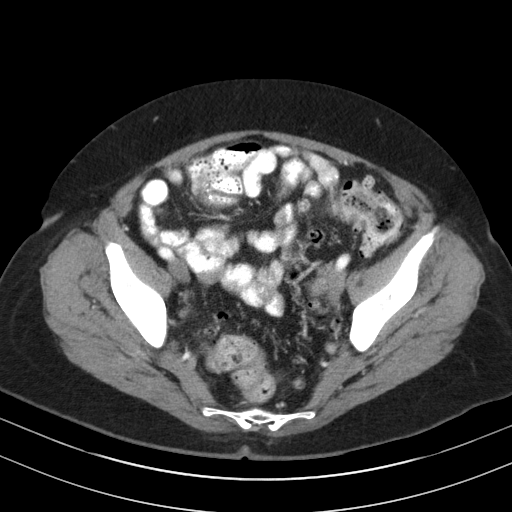
[im 34/94  soft-tissue]
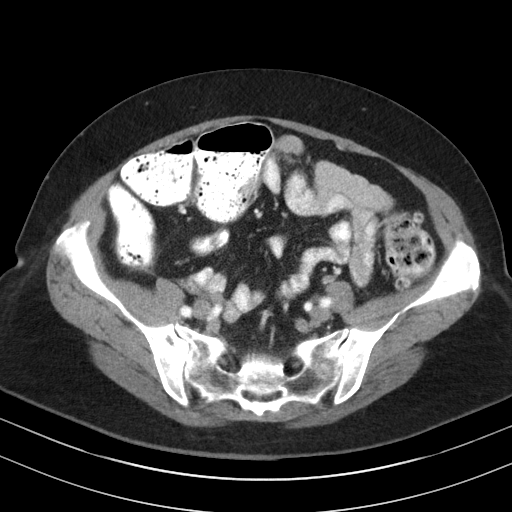
[im 40/94  soft-tissue]
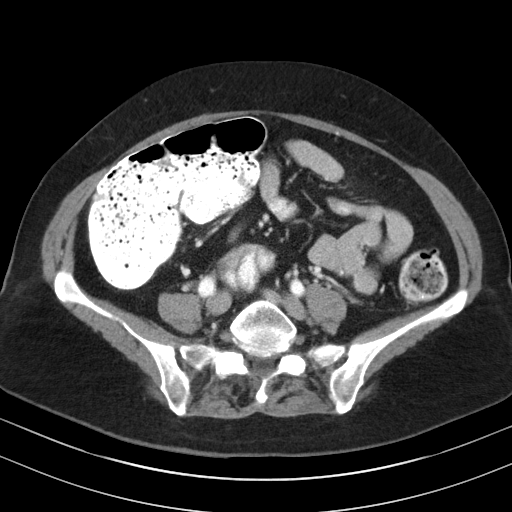
[im 54/94  soft-tissue]
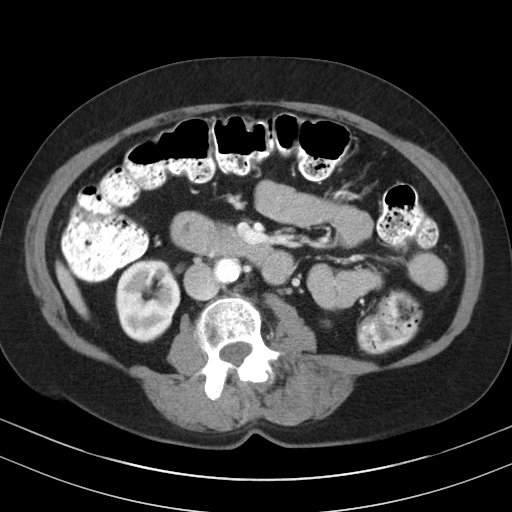
[im 60/94  soft-tissue]
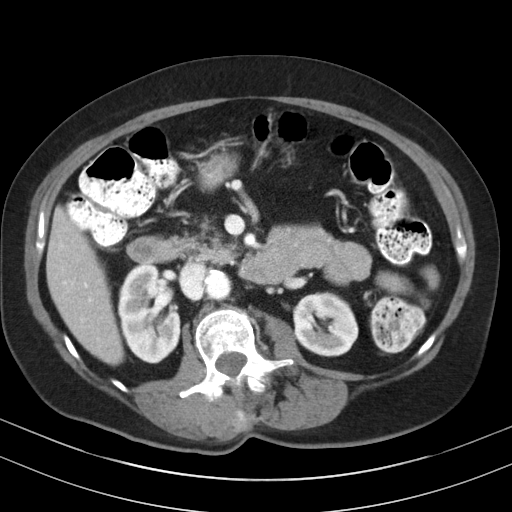
[im 67/94  soft-tissue]
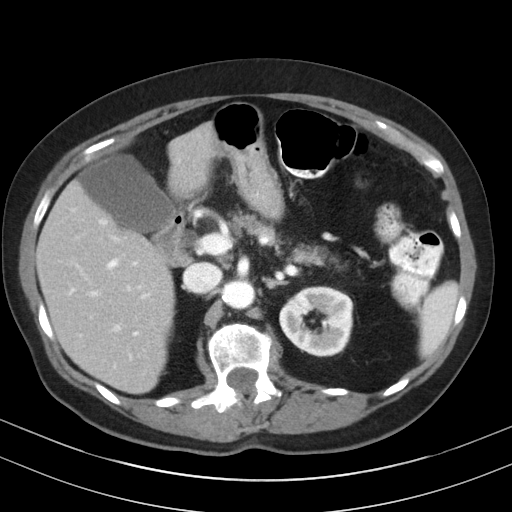
[im 67/94  lung]
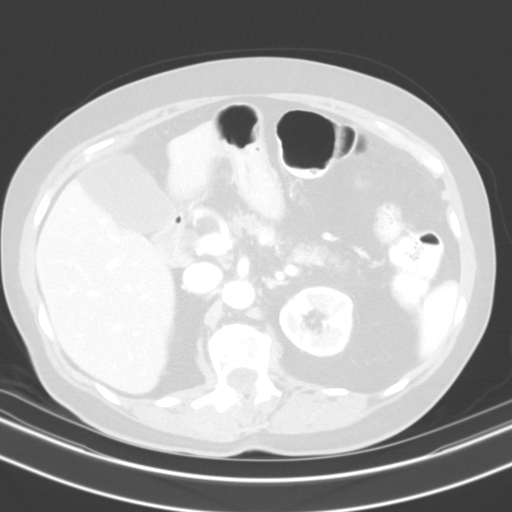
[im 67/94  bone]
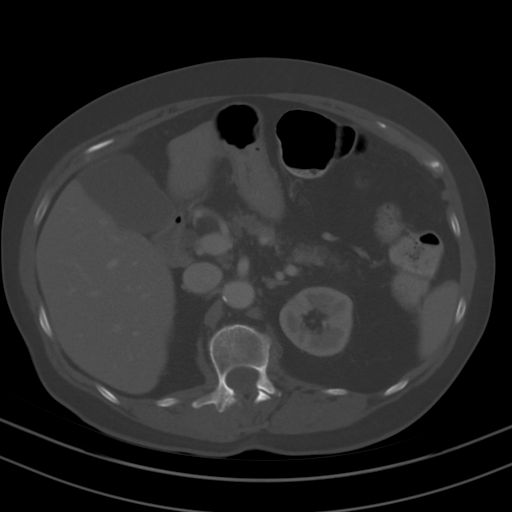
[im 74/94  soft-tissue]
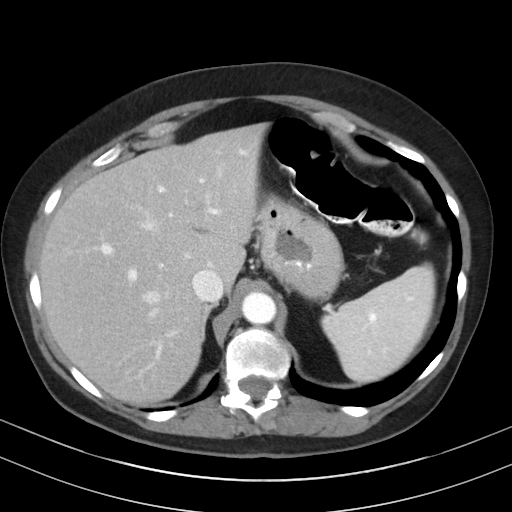
[im 74/94  lung]
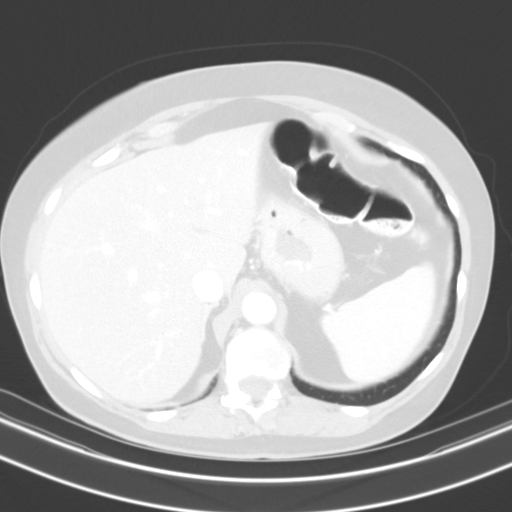
[im 80/94  soft-tissue]
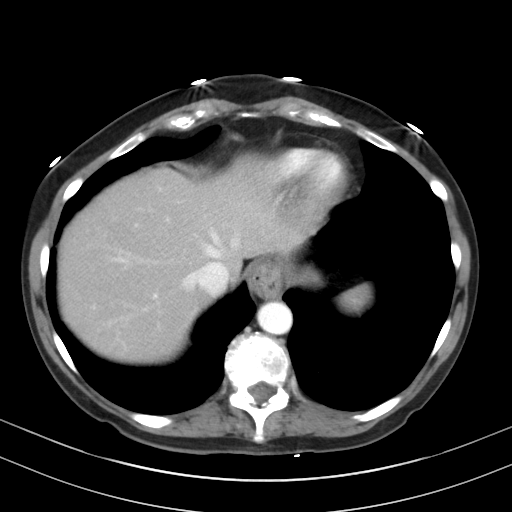
[im 80/94  lung]
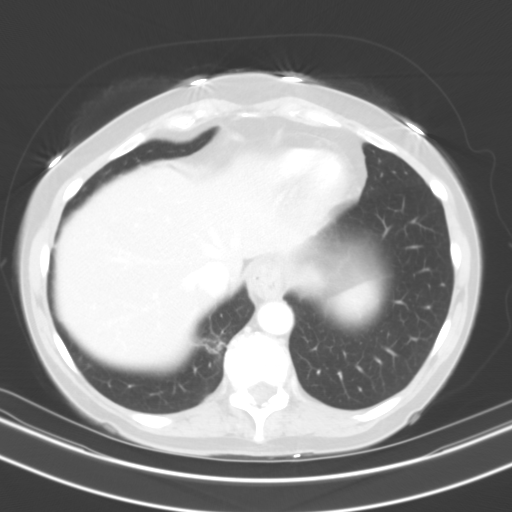
[im 87/94  soft-tissue]
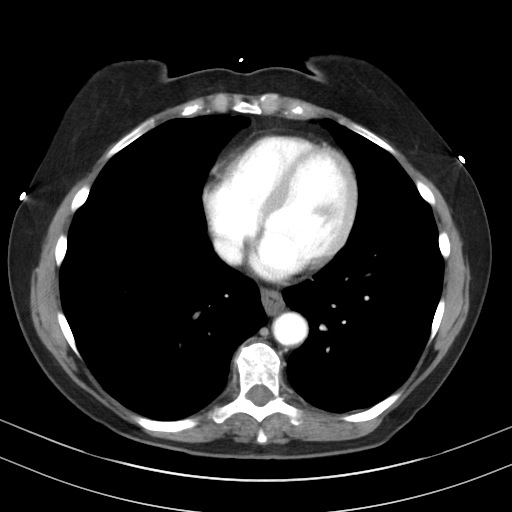
[im 87/94  lung]
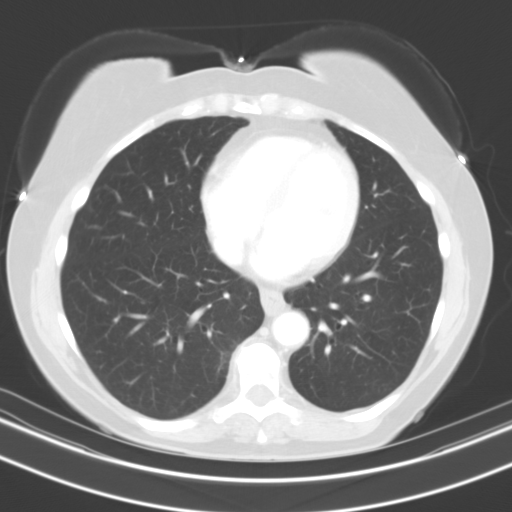

[12 of 32 positions shown; findings below may reference images not displayed]

FINDINGS: Lower chest: Mild basilar scarring on the RIGHT. No pleural
effusion. No consolidation.

Hepatobiliary: Liver normal in size and contour. No focal hepatic
lesion. No pericholecystic stranding. No biliary duct dilation.
Normal appearance of portal vein.

Pancreas: Pancreas with scattered fatty atrophy. No suspicious
lesion. No ductal dilation. No inflammation.

Spleen: Normal spleen.

Adrenals/Urinary Tract: Adrenal glands are normal.

Symmetric renal enhancement. No hydronephrosis. Vague area
intermediate density in the interpolar LEFT kidney 10 x 8 mm is not
water density, not seen on corticomedullary phase. Measuring up to
125 Hounsfield units on the nephrogram phase but entirely
intraparenchymal so prone to pseudo enhancement.

Stomach/Bowel: Extensive stool throughout the colon. Colonic
diverticulosis. No signs of acute diverticulitis.

Small hiatal hernia. Mild circumferential distal esophageal
thickening. No acute small bowel process. Appendix not visualized.
No pericecal stranding to suggest acute appendiceal process or acute
bowel process.

Vascular/Lymphatic: Calcified and noncalcified atheromatous plaque
of the abdominal aorta. No aneurysmal dilation. No adenopathy in the
retroperitoneum. No pelvic lymphadenopathy.

Reproductive: No pelvic mass. Unremarkable CT appearance of
reproductive this rib.

Other: No ascites.

Musculoskeletal: Dextroconvex lumbar scoliotic curvature in the
setting of degenerative changes similar to previous imaging no acute
bone process. No destructive bone finding.
IMPRESSION: 1. Vague area intermediate density in the interpolar LEFT kidney is
not water density, not seen on the corticomedullary phase. Measuring
up to 125 Hounsfield units on the nephrogram phase but entirely
intraparenchymal so prone to pseudo enhancement. This may represent
a small renal neoplasm or hemorrhagic cyst with pseudo enhancement.
MRI follow-up within 3 months with and without contrast is suggested
further assessment.
2. Diverticulosis and diverticular disease, moderate to marked in
the sigmoid colon with abundant stool throughout the colon. No signs
of acute inflammation.
3. Small hiatal hernia. Mild circumferential distal esophageal
thickening. Correlate with any symptoms of esophagitis.
4. Aortic atherosclerosis.

Aortic Atherosclerosis ([FY]-[FY]).

## 2020-07-17 MED ORDER — IOPAMIDOL (ISOVUE-300) INJECTION 61%
100.0000 mL | Freq: Once | INTRAVENOUS | Status: AC | PRN
  Administered 2020-07-17: 100 mL via INTRAVENOUS

## 2020-07-22 ENCOUNTER — Other Ambulatory Visit: Payer: Self-pay | Admitting: Gastroenterology

## 2020-07-22 DIAGNOSIS — Z1231 Encounter for screening mammogram for malignant neoplasm of breast: Secondary | ICD-10-CM

## 2020-07-22 DIAGNOSIS — R1013 Epigastric pain: Secondary | ICD-10-CM

## 2020-07-22 DIAGNOSIS — R93429 Abnormal radiologic findings on diagnostic imaging of unspecified kidney: Secondary | ICD-10-CM

## 2020-08-07 ENCOUNTER — Ambulatory Visit
Admission: RE | Admit: 2020-08-07 | Discharge: 2020-08-07 | Disposition: A | Discharge status: Home / Self Care | Payer: Medicare Other | Source: Ambulatory Visit | Attending: Gastroenterology | Admitting: Gastroenterology

## 2020-08-07 ENCOUNTER — Other Ambulatory Visit: Payer: Self-pay

## 2020-08-07 DIAGNOSIS — Z1231 Encounter for screening mammogram for malignant neoplasm of breast: Secondary | ICD-10-CM

## 2020-08-07 IMAGING — MG DIGITAL SCREENING BILAT W/ TOMO W/ CAD
8 series · 9 of 24 positions shown · non-contrast
Comparison: Previous exam(s).

CLINICAL DATA: Screening.

EXAM:
DIGITAL SCREENING BILATERAL MAMMOGRAM WITH TOMO AND CAD

[L CC synth-2D]
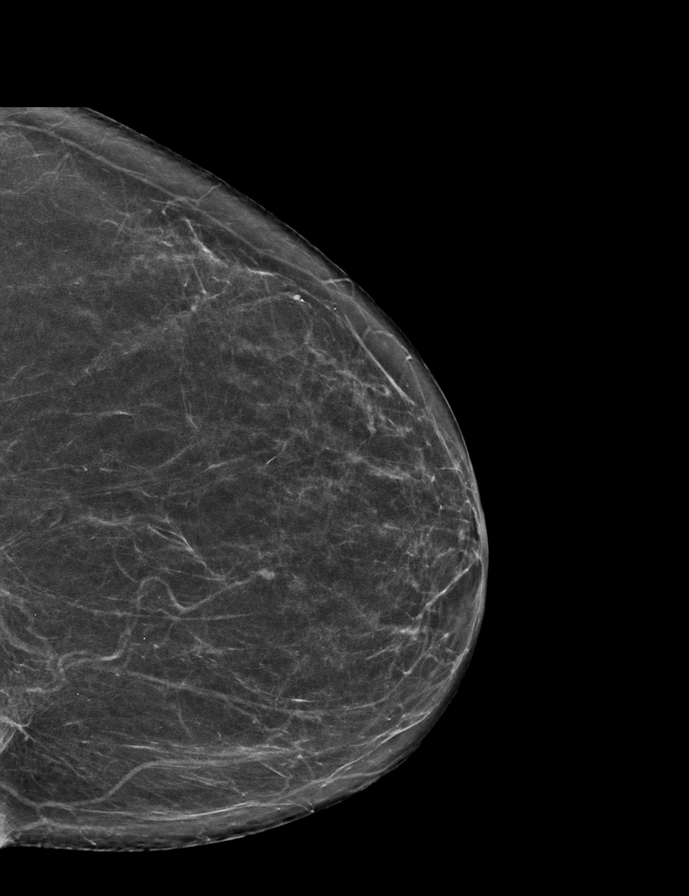

[R MLO synth-2D]
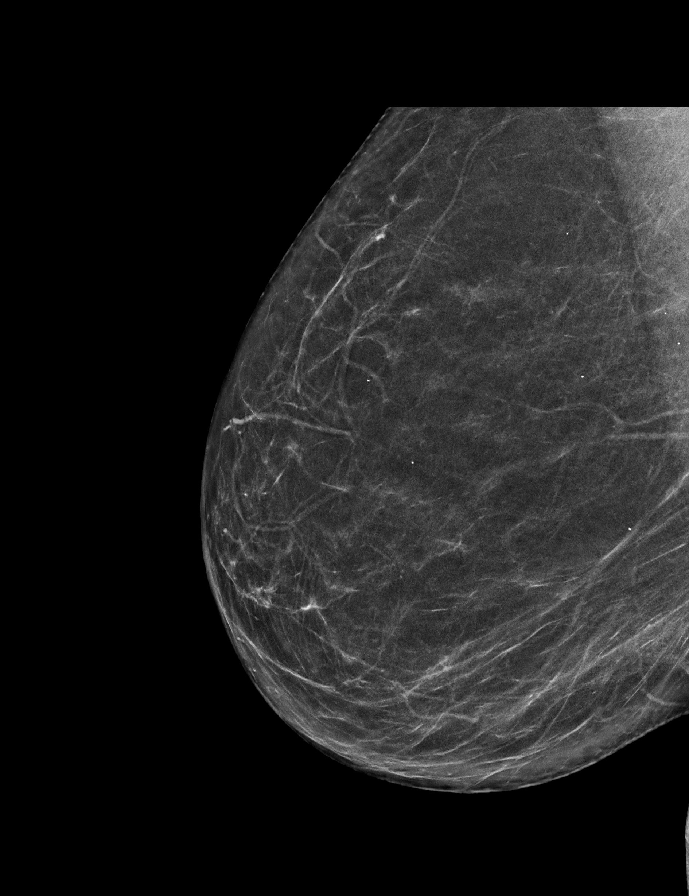

[L MLO synth-2D]
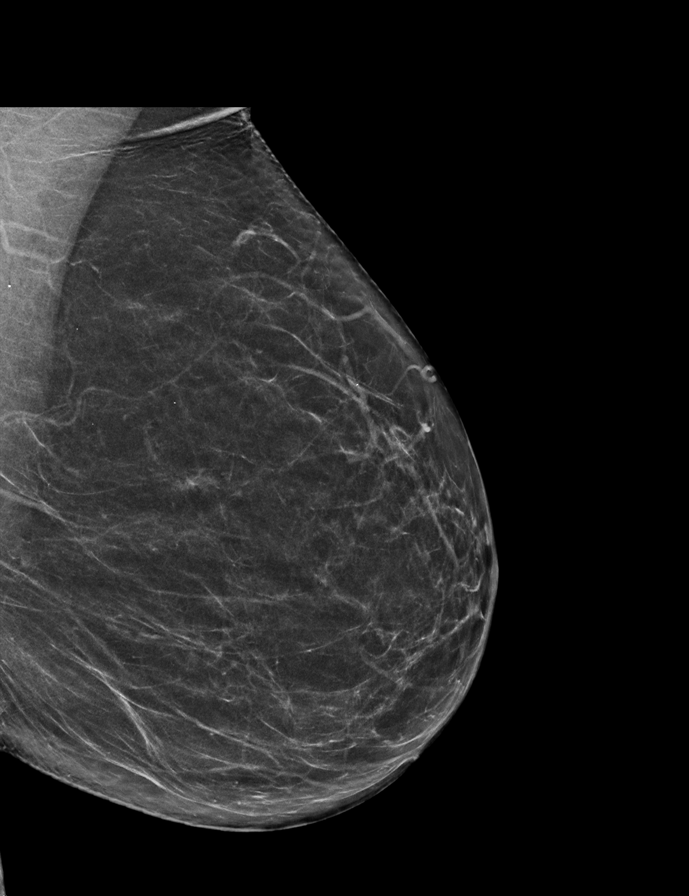

[R CC synth-2D]
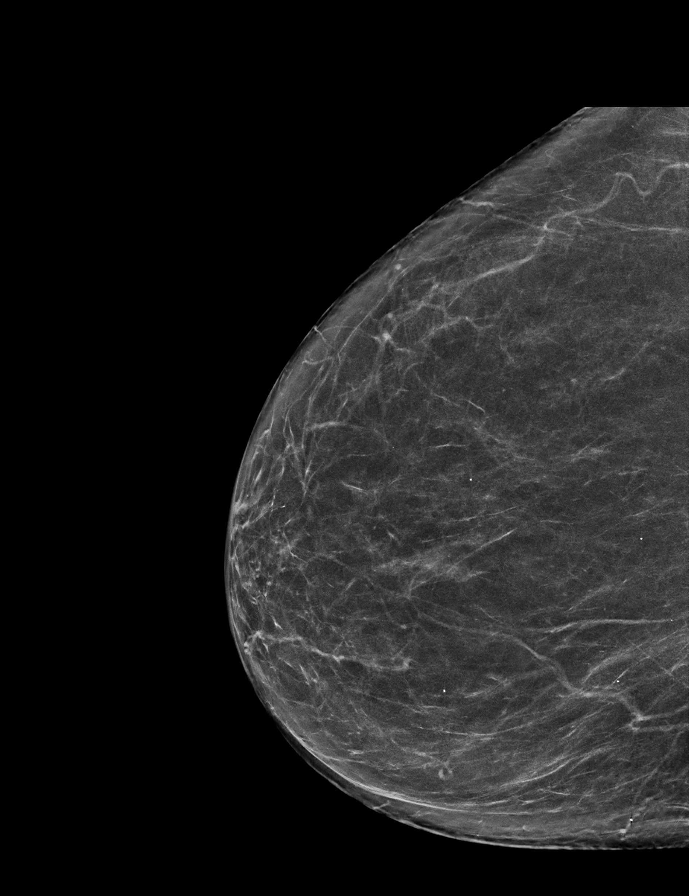

[L CC tomo · 2 of 61 frames shown]
[frame 20/61]
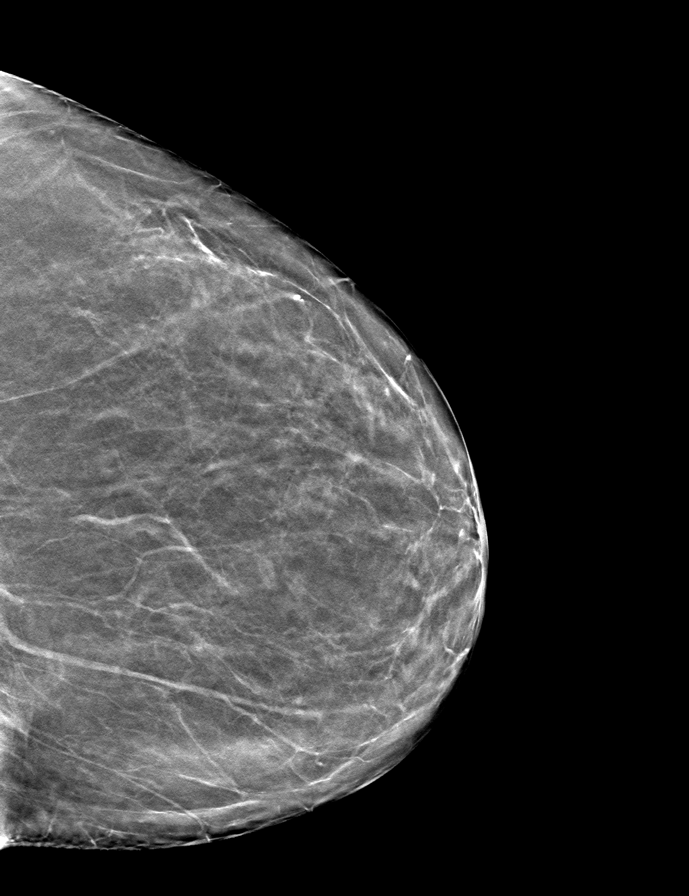
[frame 31/61]
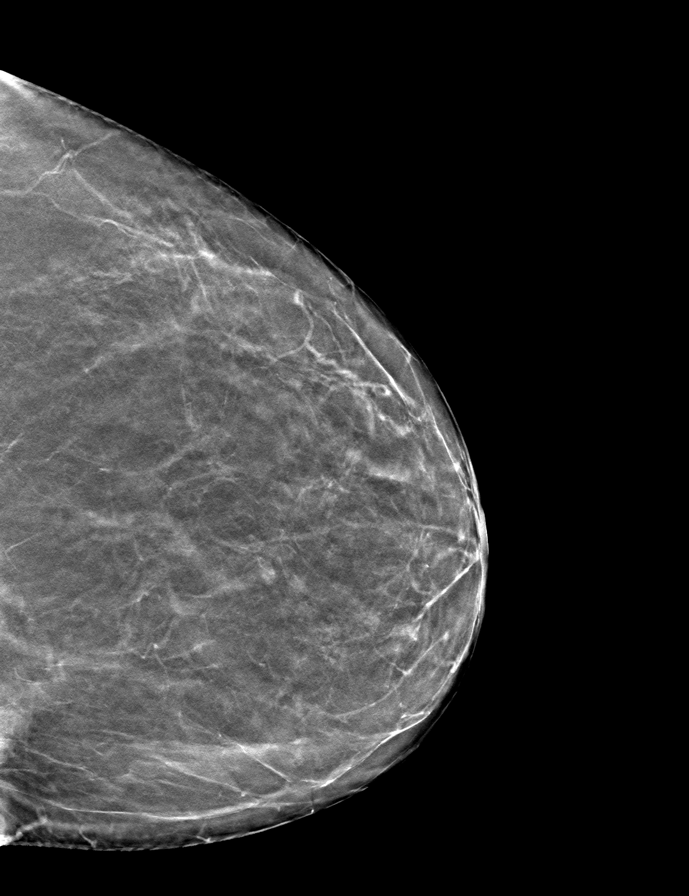

[R CC tomo · tomo slice 32/63.0]
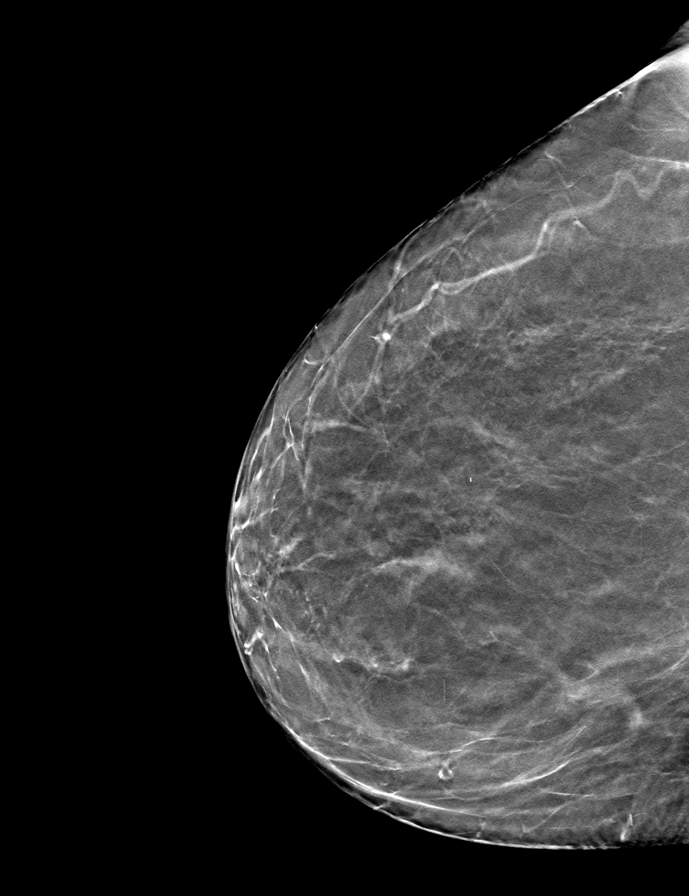

[L MLO tomo · tomo slice 33/65.0]
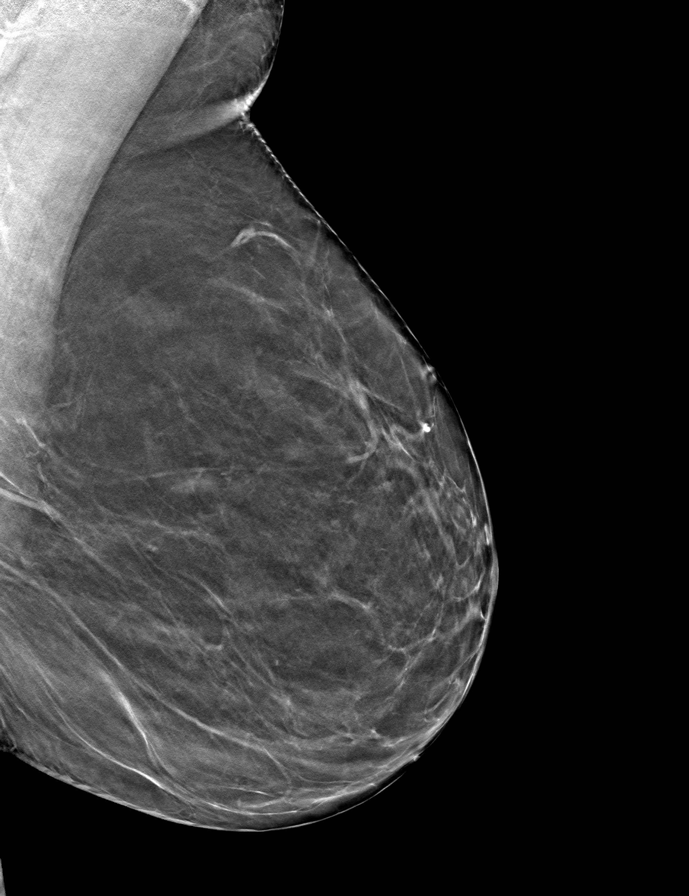

[R MLO tomo · tomo slice 33/65.0]
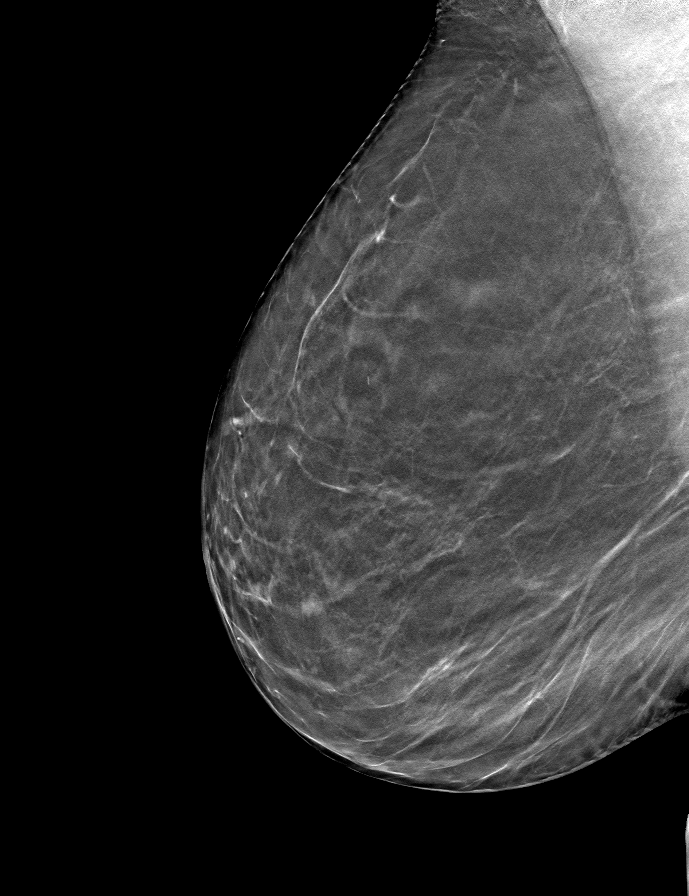

[9 of 24 positions shown; findings below may reference images not displayed]

ACR Breast Density Category b: There are scattered areas of
fibroglandular density.
FINDINGS: There are no findings suspicious for malignancy. Images were
processed with CAD.
IMPRESSION: No mammographic evidence of malignancy. A result letter of this
screening mammogram will be mailed directly to the patient.

RECOMMENDATION:
Screening mammogram in one year. (Code:[TQ])

BI-RADS CATEGORY  1: Negative.

## 2020-08-15 ENCOUNTER — Ambulatory Visit
Admission: RE | Admit: 2020-08-15 | Discharge: 2020-08-15 | Disposition: A | Discharge status: Home / Self Care | Payer: Medicare Other | Source: Ambulatory Visit | Attending: Gastroenterology | Admitting: Gastroenterology

## 2020-08-15 ENCOUNTER — Other Ambulatory Visit: Payer: Self-pay

## 2020-08-15 DIAGNOSIS — R93429 Abnormal radiologic findings on diagnostic imaging of unspecified kidney: Secondary | ICD-10-CM

## 2020-08-15 DIAGNOSIS — R1013 Epigastric pain: Secondary | ICD-10-CM

## 2020-08-15 IMAGING — MR MR ABDOMEN WO/W CM
11 of 18 series · 27 of 48 positions shown · IV contrast (multihance)
Comparison: [DATE]

CLINICAL DATA: Evaluate left kidney density

EXAM:
MRI ABDOMEN WITHOUT AND WITH CONTRAST
TECHNIQUE: Multiplanar multisequence MR imaging of the abdomen was performed
both before and after the administration of intravenous contrast.
CONTRAST:  12mL MULTIHANCE GADOBENATE DIMEGLUMINE 529 MG/ML IV SOLN

[Series 3: T2 · coronal · 5.0mm · 1.56mm/px · 2 of 26 slices shown (1 of 3)]
[im 1/26]
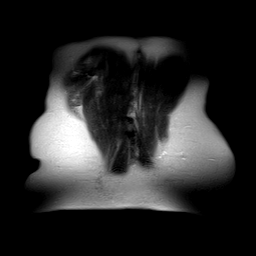
[im 26/26]
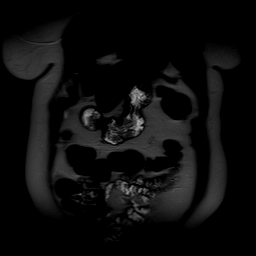

[Series 4: axial tru fisp · axial · 4.0mm · 1.48mm/px · z∈[-62,+110]mm · 3 of 34 slices shown]
[im 1/34]
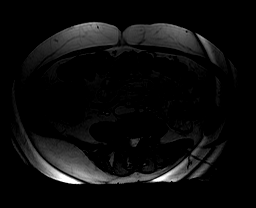
[im 17/34]
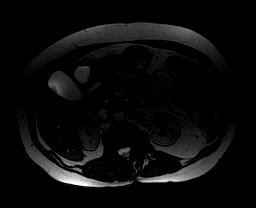
[im 34/34]
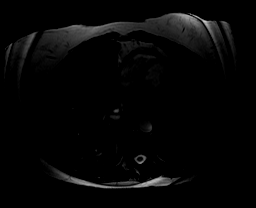

[Series 6: [id number]_500_800_p2 · axial · 6.0mm · 1.98mm/px · z∈[-35,+137]mm · 4 of 69 slices shown]
[im 1/69]
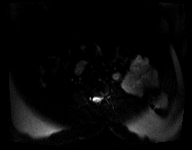
[im 23/69]
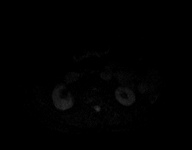
[im 46/69]
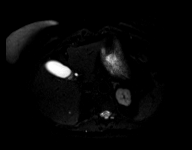
[im 69/69]
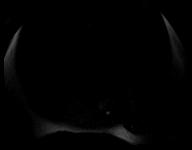

[Series 7: [id number]_adc · axial · 6.0mm · 1.98mm/px · 1 of 23 slices shown]
[im 1/23]
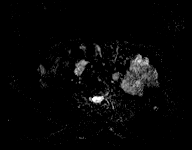

[Series 8: T2 · axial · 6.5mm · 0.74mm/px · 1 of 24 slices shown (2 of 3)]
[im 1/24]
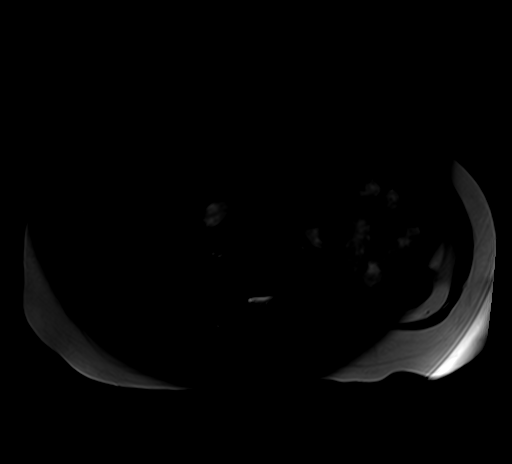

[Series 9: T2 · axial · 5.0mm · 1.45mm/px · z∈[-70,+118]mm · 2 of 30 slices shown (3 of 3)]
[im 1/30]
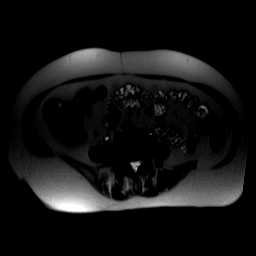
[im 30/30]
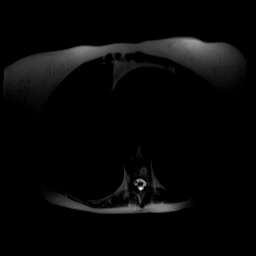

[Series 10: axial in out · axial · 5.5mm · 0.74mm/px · z∈[-61,+109]mm · 3 of 56 slices shown]
[im 1/56]
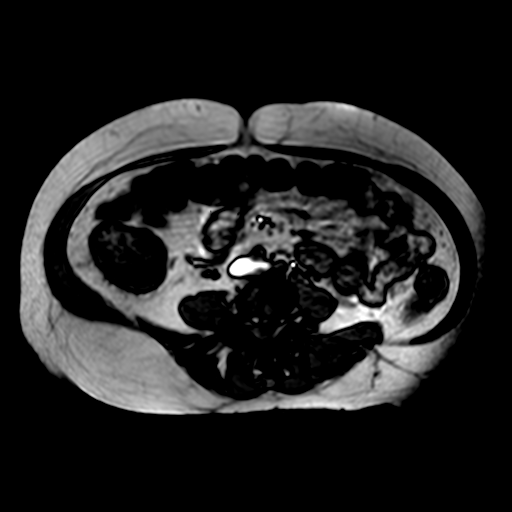
[im 28/56]
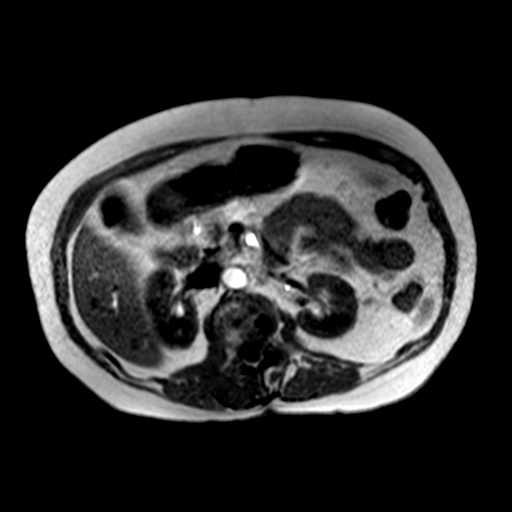
[im 56/56]
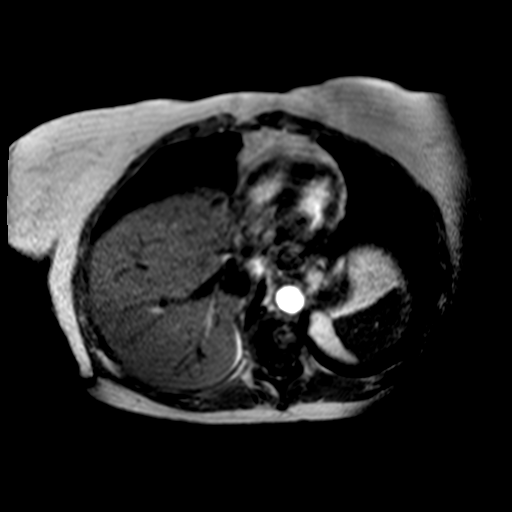

[Series 11: T1 dynamic · axial · non-contrast · 2.5mm · 0.78mm/px · z∈[-55,+103]mm · 3 of 64 slices shown]
[im 1/64]
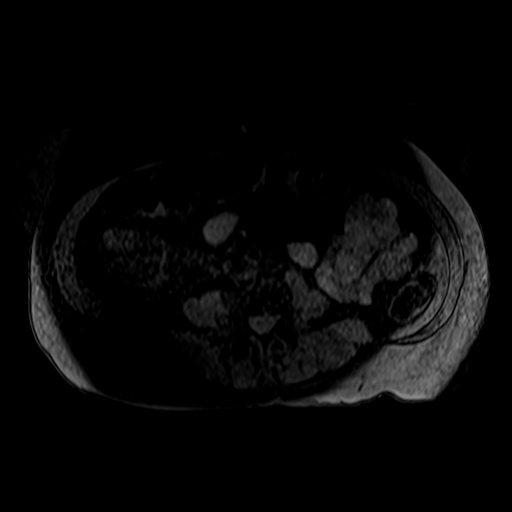
[im 32/64]
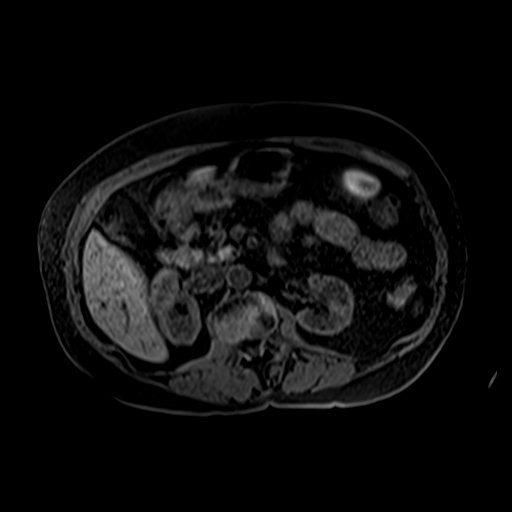
[im 64/64]
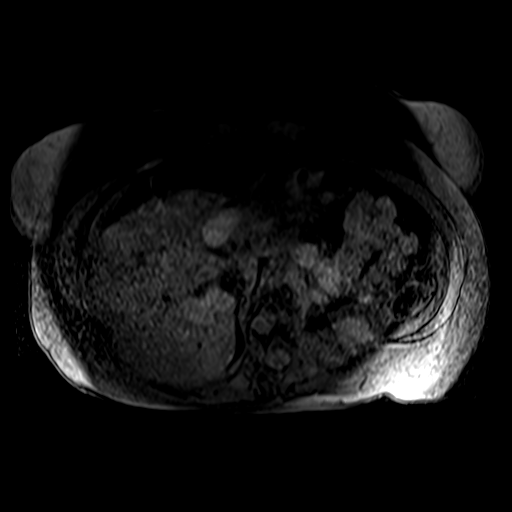

[Series 12: post 25 sec · axial · 2.5mm · 0.78mm/px · z∈[-55,+103]mm · 3 of 64 slices shown]
[im 1/64]
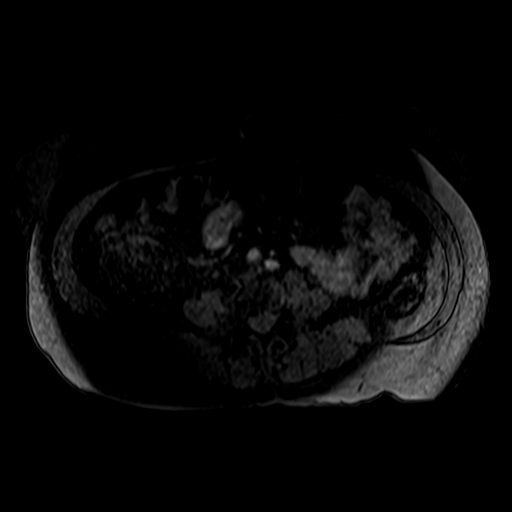
[im 32/64]
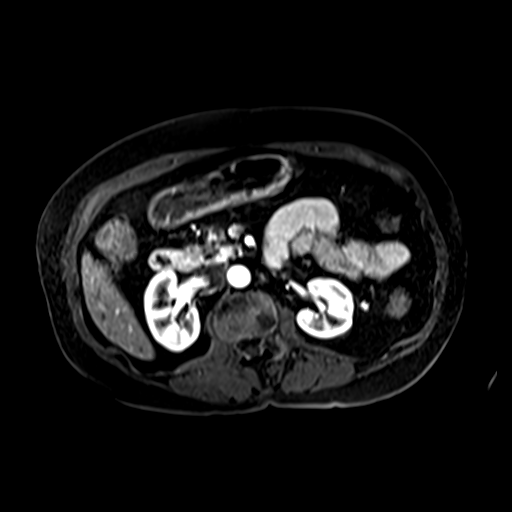
[im 64/64]
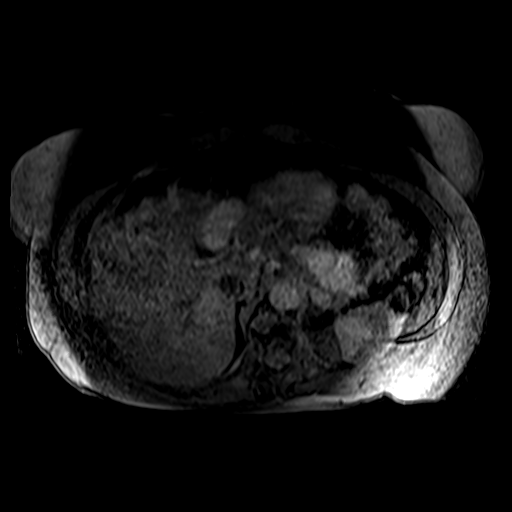

[Series 13: post 25 sec_sub · axial · 2.5mm · 0.78mm/px · z∈[-55,+103]mm · 3 of 64 slices shown]
[im 1/64]
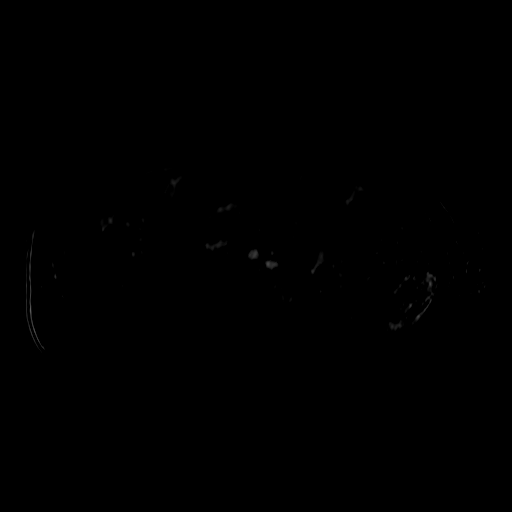
[im 32/64]
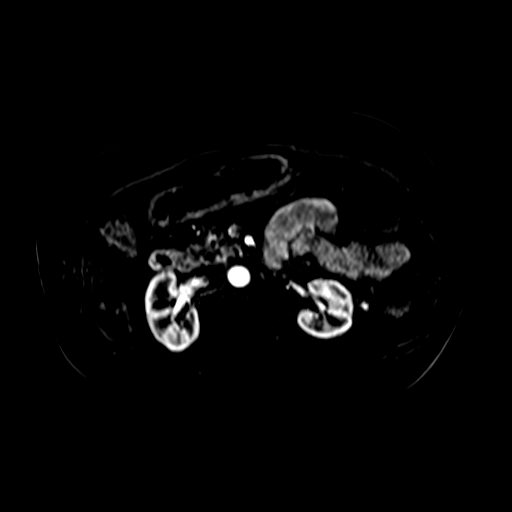
[im 64/64]
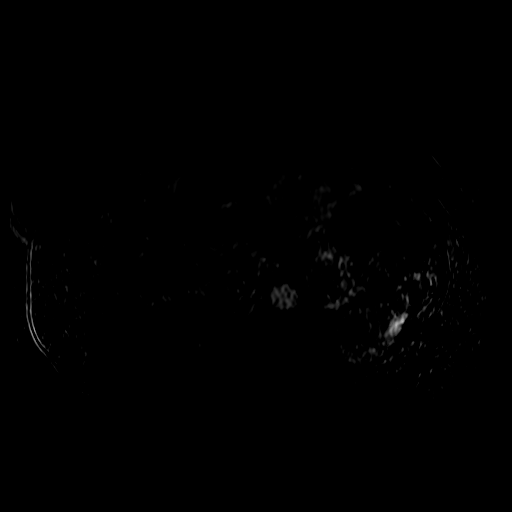

[Series 14: post 45 sec · axial · 2.5mm · 0.78mm/px · z∈[-55,+23]mm · 2 of 64 slices shown]
[im 1/64]
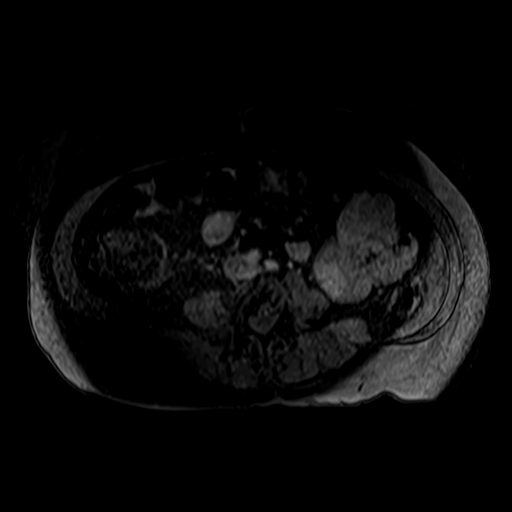
[im 32/64]
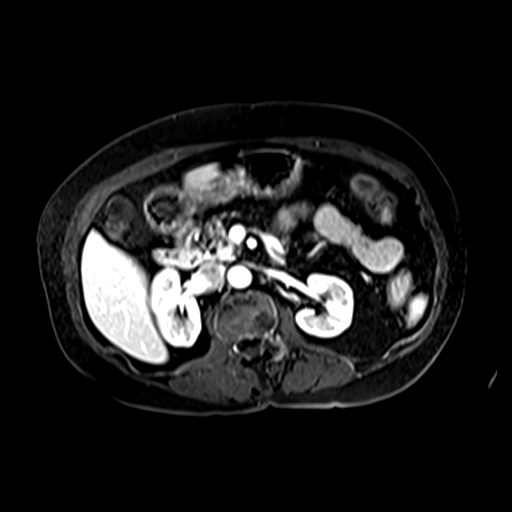

[27 of 48 positions shown; findings below may reference images not displayed]

FINDINGS: Lower chest: No acute findings.

Hepatobiliary: No mass or other parenchymal abnormality identified.

Pancreas: No pancreatic inflammation. No significant main duct
dilatation. Mild diffuse parenchymal atrophy. There are scattered
nonspecific unilocular cystic lesions within the pancreas. The
largest is in the uncinate process measuring 1.1 x 0.9 cm, image
[DATE].

Spleen:  Within normal limits in size and appearance.

Adrenals/Urinary Tract: Normal adrenal glands. Mild bilateral renal
cortical thinning. Corresponding to the vague area of intermediate
density in the interpolar left kidney is a focal lesion measuring 7
mm, image [DATE]. This appears T2 hypointense and mildly T1
hyperintense suggesting hemorrhagic component. Small size of this
lesion limits internal characterization on postcontrast imaging.
Similar appearance of small T2 hyperintense subcapsular lesion
arising from the lateral cortex of the interpolar left kidney
measuring 0.7 x 0.5 cm, image [DATE]. Right kidney is unremarkable. No
solid enhancing kidney lesions identified. No hydronephrosis noted
bilaterally.

Stomach/Bowel: Visualized portions within the abdomen are
unremarkable.

Vascular/Lymphatic: No pathologically enlarged lymph nodes
identified. No abdominal aortic aneurysm demonstrated.

Other:  None.

Musculoskeletal: Mild thoracolumbar scoliosis and degenerative disc
disease.
IMPRESSION: 1. Corresponding to the vague area of intermediate density in the
interpolar left kidney is a mildly complex, hemorrhagic lesion which
is difficult to characterize due to small size. Consider follow-up
imaging in 12 months with repeat renal protocol MRI without with
contrast material.
2. There are scattered, benign-appearing, nonspecific unilocular
cystic lesions within the pancreas. According to consensus criteria
follow-up imaging in 24 months with pancreas protocol abdominal MRI
or CT without and with contrast material is advised. This
recommendation follows ACR consensus guidelines: Management of
Incidental Pancreatic Cysts: A White Paper of the ACR Incidental
Findings Committee. [HOSPITAL] [ZL];[DATE].

## 2020-08-15 MED ORDER — GADOBENATE DIMEGLUMINE 529 MG/ML IV SOLN
12.0000 mL | Freq: Once | INTRAVENOUS | Status: AC | PRN
  Administered 2020-08-15: 12 mL via INTRAVENOUS

## 2020-09-02 ENCOUNTER — Other Ambulatory Visit: Payer: Self-pay | Admitting: Family Medicine

## 2020-09-02 DIAGNOSIS — I739 Peripheral vascular disease, unspecified: Secondary | ICD-10-CM

## 2020-09-10 ENCOUNTER — Ambulatory Visit
Admission: RE | Admit: 2020-09-10 | Discharge: 2020-09-10 | Disposition: A | Discharge status: Home / Self Care | Payer: Medicare Other | Source: Ambulatory Visit | Attending: Family Medicine | Admitting: Family Medicine

## 2020-09-10 DIAGNOSIS — I739 Peripheral vascular disease, unspecified: Secondary | ICD-10-CM

## 2020-12-01 DIAGNOSIS — E7800 Pure hypercholesterolemia, unspecified: Secondary | ICD-10-CM | POA: Diagnosis not present

## 2020-12-01 DIAGNOSIS — K219 Gastro-esophageal reflux disease without esophagitis: Secondary | ICD-10-CM | POA: Diagnosis not present

## 2020-12-01 DIAGNOSIS — R609 Edema, unspecified: Secondary | ICD-10-CM | POA: Diagnosis not present

## 2020-12-01 DIAGNOSIS — R002 Palpitations: Secondary | ICD-10-CM | POA: Diagnosis not present

## 2020-12-01 DIAGNOSIS — E1169 Type 2 diabetes mellitus with other specified complication: Secondary | ICD-10-CM | POA: Diagnosis not present

## 2020-12-01 DIAGNOSIS — Z0000 Encounter for general adult medical examination without abnormal findings: Secondary | ICD-10-CM | POA: Diagnosis not present

## 2020-12-01 DIAGNOSIS — Z23 Encounter for immunization: Secondary | ICD-10-CM | POA: Diagnosis not present

## 2020-12-01 DIAGNOSIS — I10 Essential (primary) hypertension: Secondary | ICD-10-CM | POA: Diagnosis not present

## 2020-12-01 DIAGNOSIS — N1832 Chronic kidney disease, stage 3b: Secondary | ICD-10-CM | POA: Diagnosis not present

## 2020-12-02 ENCOUNTER — Telehealth: Payer: Self-pay

## 2020-12-02 NOTE — Telephone Encounter (Signed)
NOTES ON FILE FROM [facility] [phone], SENT REFERRAL TO SCHEDULING 

## 2020-12-12 ENCOUNTER — Other Ambulatory Visit: Payer: Self-pay | Admitting: Family Medicine

## 2020-12-12 DIAGNOSIS — E2839 Other primary ovarian failure: Secondary | ICD-10-CM

## 2020-12-16 ENCOUNTER — Encounter: Payer: Self-pay | Admitting: General Practice

## 2021-01-04 ENCOUNTER — Telehealth: Payer: Self-pay | Admitting: Internal Medicine

## 2021-01-04 NOTE — Telephone Encounter (Signed)
FYI--      COVID-19 Pre-Screening Questions V2.:   In the past 7 to 10 days have you had a cough,  shortness of breath, headache, congestion, fever (100 or greater) body aches, chills, sore throat, or sudden loss of taste or sense of smell,?  Have you been around anyone with known Covid 19, or who is waiting for a Covid test result and is symptomatic?  Patient states she was exposed to multiple people who tested positive for COVID on 01/03/21. She is asymptomatic, but she plans to get tested on 01/05/21. Patient rescheduled her appointment for 01/19/21 at 1:40 PM with Dr. Gasper Sells (new patient).   For patients who are Covid+, pending results or exposed in the last 5 days WITH SYMPTOMS:  1.       Offer to change appointment to a [place] appointment. If the patient declines, contact covering nurse or triage so it can be determined if patient needs to be seen or     rescheduled. (assumes patient calls ahead)  2.       If a patient presents in the lobby, ask them to return to their car and the nurse will call them. Obtain the correct cell phone number and message the covering nurse. The nurse will triage the patient and discuss with the provider to determine appropriate visit plan (In office, MyChart or reschedule).   3.       If it is determined the patient needs to be seen in the office, arrangements will be made for the patient to be seen in a remote room with minimal touches. (Location will be         specific to each [facility] site).               If you have any concerns/questions about symptoms patients report during screening (either on the phone or at threshold),                Send a secure chat with the patient's chart to the APP doing preop clearances for that day.              If the decision is made to see the patient, document the following in the appt. note:  "cleared by APP's initials".                        If an APP is not available contact a member of the  leadership team.   Patients who are Covid+ are allowed in the office when the following are met: 1. No symptoms or improving symptoms  2. At least 5 days post positive test

## 2021-01-05 ENCOUNTER — Ambulatory Visit: Payer: Medicare Other | Admitting: Internal Medicine

## 2021-01-18 ENCOUNTER — Encounter: Payer: Self-pay | Admitting: General Practice

## 2021-01-19 ENCOUNTER — Ambulatory Visit: Payer: Medicare Other | Admitting: Internal Medicine

## 2021-02-25 DIAGNOSIS — M5416 Radiculopathy, lumbar region: Secondary | ICD-10-CM | POA: Diagnosis not present

## 2021-03-02 ENCOUNTER — Other Ambulatory Visit: Payer: Self-pay | Admitting: Family Medicine

## 2021-03-02 DIAGNOSIS — M5416 Radiculopathy, lumbar region: Secondary | ICD-10-CM

## 2021-03-09 ENCOUNTER — Ambulatory Visit: Payer: Medicare Other | Admitting: Interventional Cardiology

## 2021-03-17 ENCOUNTER — Other Ambulatory Visit: Payer: Self-pay

## 2021-03-17 ENCOUNTER — Ambulatory Visit
Admission: RE | Admit: 2021-03-17 | Discharge: 2021-03-17 | Disposition: A | Discharge status: Home / Self Care | Payer: Medicare Other | Source: Ambulatory Visit | Attending: Family Medicine | Admitting: Family Medicine

## 2021-03-17 DIAGNOSIS — M48061 Spinal stenosis, lumbar region without neurogenic claudication: Secondary | ICD-10-CM | POA: Diagnosis not present

## 2021-03-17 DIAGNOSIS — M545 Low back pain, unspecified: Secondary | ICD-10-CM | POA: Diagnosis not present

## 2021-03-17 DIAGNOSIS — M5416 Radiculopathy, lumbar region: Secondary | ICD-10-CM

## 2021-03-17 IMAGING — MR MR LUMBAR SPINE W/O CM
5 of 6 series · 29 of 48 positions shown · non-contrast
Comparison: None.

CLINICAL DATA: Low back pain with bilateral hip and leg pain. Fall
yesterday.

EXAM:
MRI LUMBAR SPINE WITHOUT CONTRAST
TECHNIQUE: Multiplanar, multisequence MR imaging of the lumbar spine was
performed. No intravenous contrast was administered.

[Series 2: T2 · sagittal · 4.0mm · 1.09mm/px · 4 of 17 slices shown (1 of 3)]
[im 1/17]
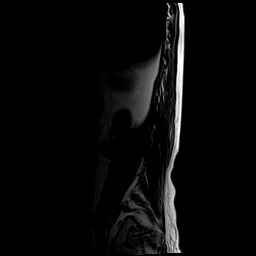
[im 6/17]
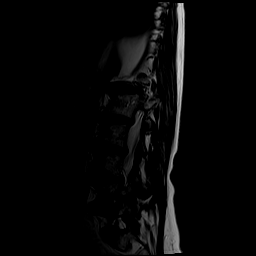
[im 11/17]
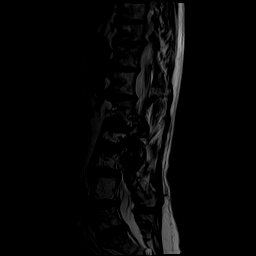
[im 17/17]
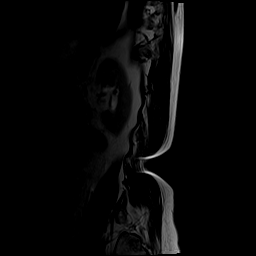

[Series 4: T1 · sagittal · 4.0mm · 1.09mm/px · 5 of 17 slices shown (1 of 2)]
[im 1/17]
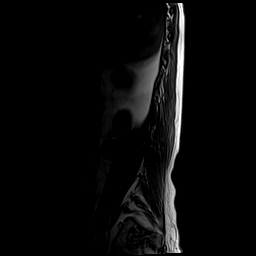
[im 5/17]
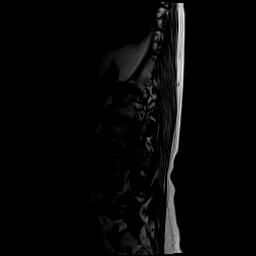
[im 9/17]
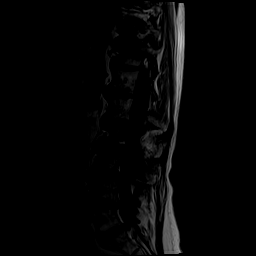
[im 13/17]
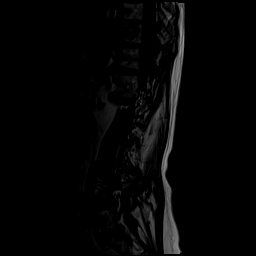
[im 17/17]
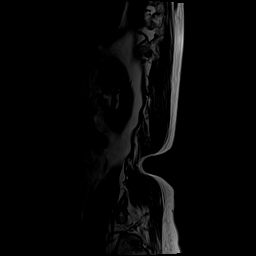

[Series 5: T2 · axial · 4.0mm · 0.39mm/px · z∈[-138,+107]mm · 8 of 46 slices shown (2 of 3)]
[im 1/46]
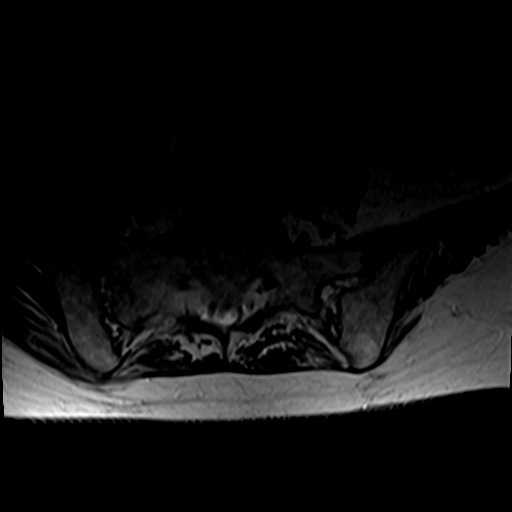
[im 7/46]
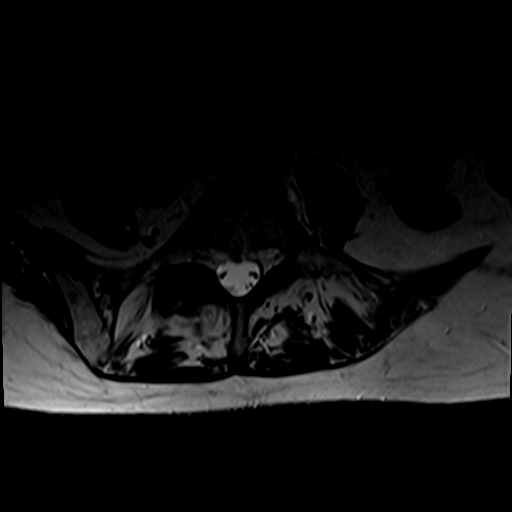
[im 14/46]
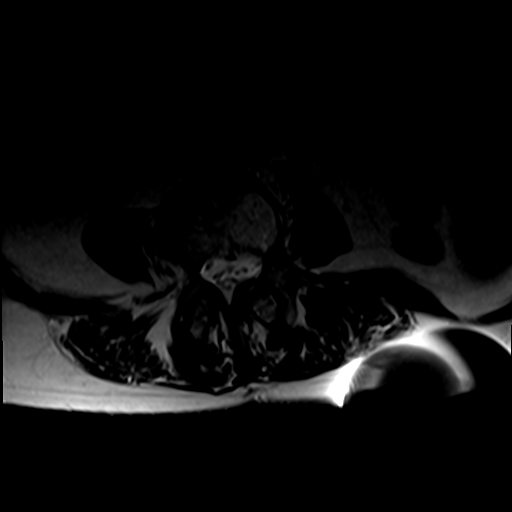
[im 21/46]
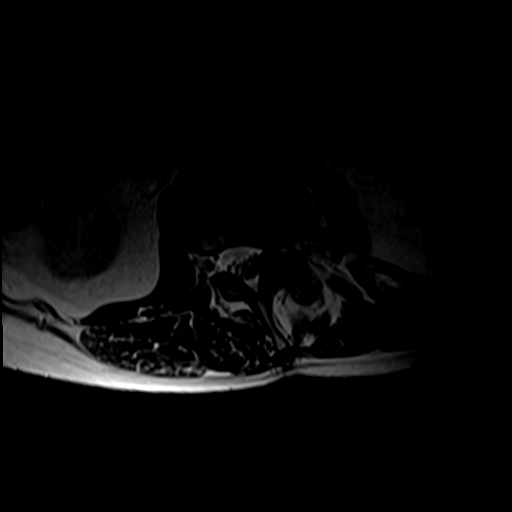
[im 25/46]
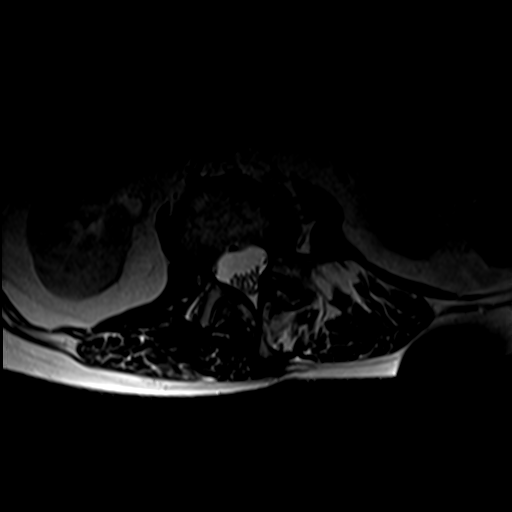
[im 32/46]
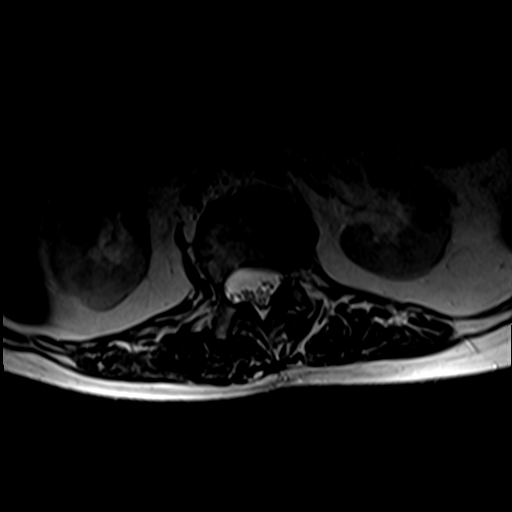
[im 39/46]
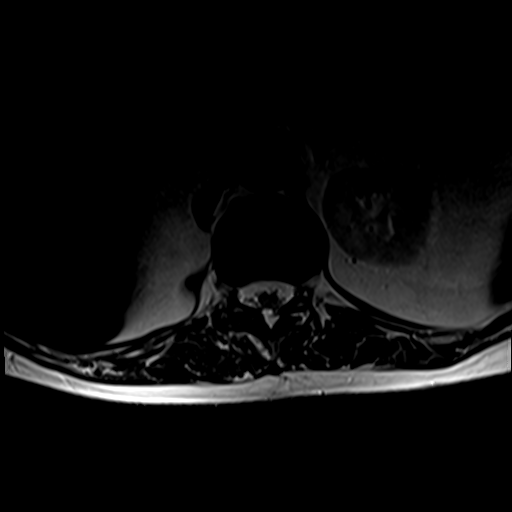
[im 46/46]
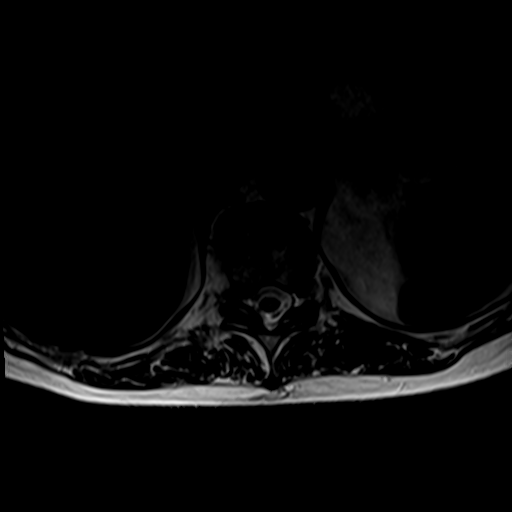

[Series 6: T1 · axial · 4.0mm · 0.39mm/px · z∈[-138,+40]mm · 6 of 46 slices shown (2 of 2)]
[im 1/46]
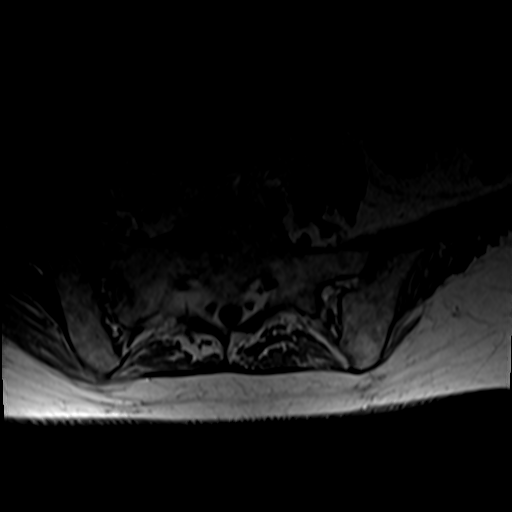
[im 7/46]
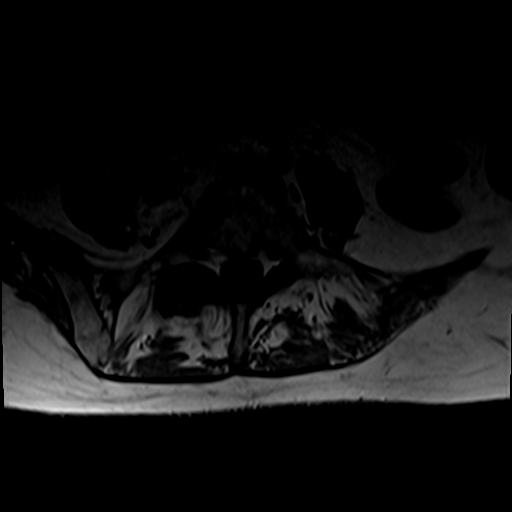
[im 14/46]
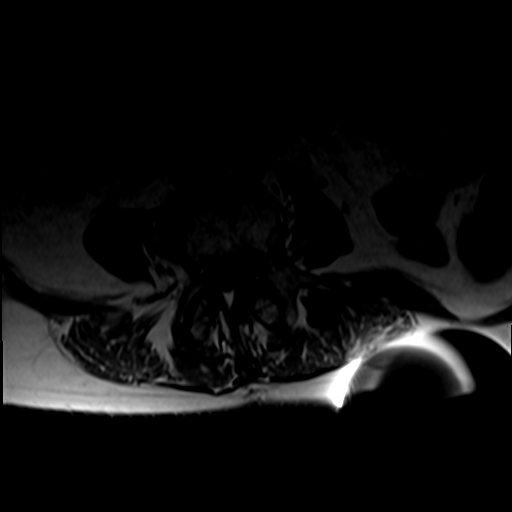
[im 21/46]
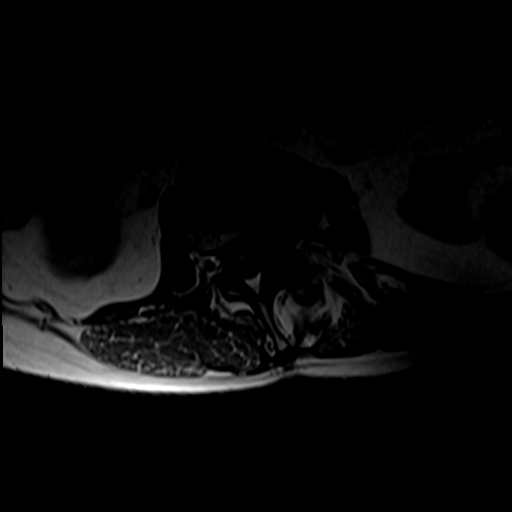
[im 25/46]
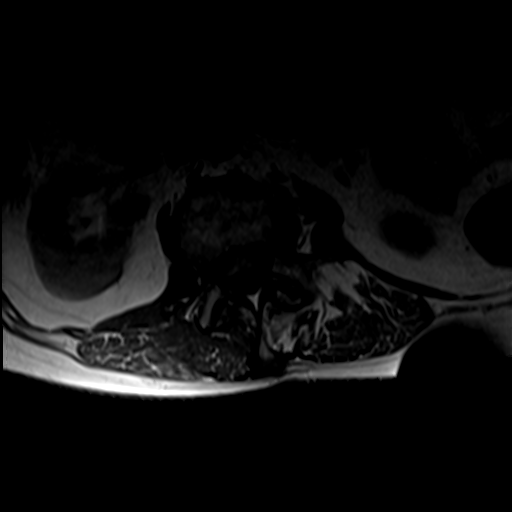
[im 32/46]
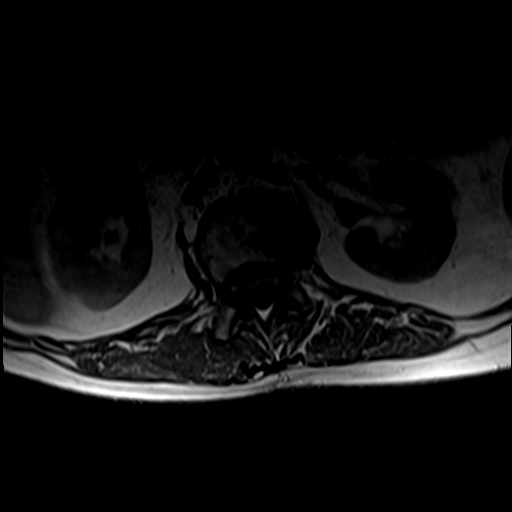

[Series 7: T2 · coronal · 4.0mm · 1.09mm/px · 6 of 19 slices shown (3 of 3)]
[im 1/19]
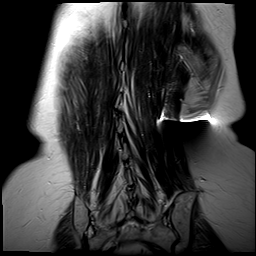
[im 4/19]
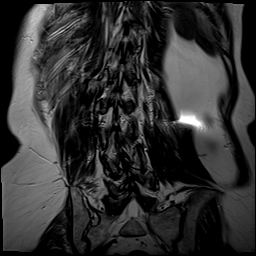
[im 8/19]
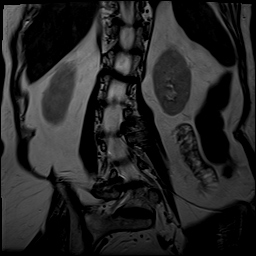
[im 11/19]
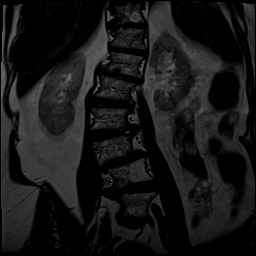
[im 15/19]
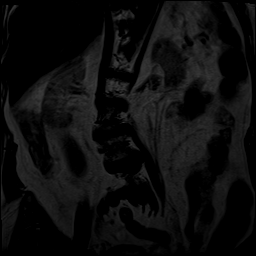
[im 19/19]
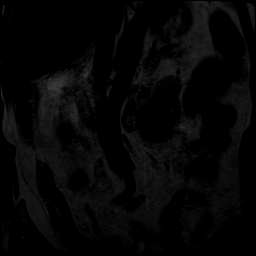

[29 of 48 positions shown; findings below may reference images not displayed]

FINDINGS: Segmentation:  Normal

Alignment:  Mild retrolisthesis L1-2.  Mild anterolisthesis L3-4.

Moderate dextroscoliosis lumbar spine

Vertebrae: Negative for fracture or mass. Hemangioma T11 and L4
vertebral bodies.

Conus medullaris and cauda equina: Conus extends to the T12-L1
level. Conus and cauda equina appear normal.

Paraspinal and other soft tissues: Negative for paraspinous mass or
adenopathy.

Disc levels:

T10-11: Disc degeneration and spurring asymmetric to the right.
Right foraminal stenosis.

T11-12: Diffuse bulging of the disc and bilateral facet
degeneration. Negative for stenosis

T12-L1: Disc degeneration and disc bulging. Asymmetric disc bulging
to the right with associated spurring. No significant spinal or
foraminal stenosis.

L1-2: Asymmetric disc degeneration and spurring on the left due to
scoliosis. Small central disc protrusion. Mild subarticular stenosis
on the left. Mild facet degeneration

L2-3: Asymmetric disc degeneration and spurring on the left due to
scoliosis. Moderate to severe subarticular and foraminal stenosis on
the left. Bilateral facet degeneration with mild spinal stenosis

L3-4: Diffuse bulging of the disc. Moderate to severe facet
degeneration bilaterally. Mild to moderate spinal stenosis.
Broad-based central disc protrusion. Moderate subarticular stenosis
bilaterally left greater than right.

L4-5: Asymmetric disc degeneration and spurring on the right.
Moderate to severe subarticular and foraminal stenosis on the right.
Mild facet degeneration

L5-S1: Mild facet degeneration. Negative for stenosis. Small central
disc protrusion.
IMPRESSION: Moderate dextroscoliosis. Multilevel degenerative change throughout
the lumbar spine. Negative for fracture

Multilevel spinal and foraminal stenosis due to scoliosis and
degenerative changes, as described above.

## 2021-03-18 ENCOUNTER — Other Ambulatory Visit: Payer: Medicare Other

## 2021-04-19 DIAGNOSIS — M419 Scoliosis, unspecified: Secondary | ICD-10-CM | POA: Diagnosis not present

## 2021-04-30 ENCOUNTER — Other Ambulatory Visit: Payer: Medicare Other

## 2021-05-21 ENCOUNTER — Ambulatory Visit: Payer: Medicare Other | Admitting: Cardiology

## 2021-05-21 NOTE — Progress Notes (Deleted)
Electrophysiology Office Note:    Date:  05/21/2021   ID:  Sabrina Rodriguez, DOB [birth year], MRN [id number]  PCP:  [name], MD  [facility] Cardiologist:  Lance Muss, MD  [facility] Electrophysiologist:  None   Referring MD: [name], MD   Chief Complaint: Palpitations  History of Present Illness:    Sabrina Rodriguez is a 70 y.o. female who presents for an evaluation of palpitations at the request of . Their medical history includes HTN, DM, depression, CKD 3B, GERD.  She last saw Dr. Katrinka Blazing on December 01, 2020.  At that appointment she reported a feeling of her heart pausing when she is stressed.  No syncope or presyncope.  Past Medical History:  Diagnosis Date   Acid reflux    Diabetes mellitus without complication (HCC)    pre diabetes   Diverticulitis    Hypertension     No past surgical history on file.  Current Medications: No outpatient medications have been marked as taking for the 05/21/21 encounter (Appointment) with Lanier Prude, MD.     Allergies:   Patient has no known allergies.   Social History   Socioeconomic History   Marital status: Single    Spouse name: Not on file   Number of children: Not on file   Years of education: Not on file   Highest education level: Not on file  Occupational History   Not on file  Tobacco Use   Smoking status: Never   Smokeless tobacco: Never  Substance and Sexual Activity   Alcohol use: Yes    Comment: occasional glass of wine   Drug use: Not on file   Sexual activity: Not on file  Other Topics Concern   Not on file  Social History Narrative   Not on file   Social Determinants of Health   Financial Resource Strain: Not on file  Food Insecurity: Not on file  Transportation Needs: Not on file  Physical Activity: Not on file  Stress: Not on file  Social Connections: Not on file     Family History: The patient's family history includes Bowel Disease in her sister; Breast cancer in her  sister; Hyperlipidemia in her father; Hypertension in her father; Lung disease in her mother; Thyroid cancer in her sister.  ROS:   Please see the history of present illness.    All other systems reviewed and are negative.  EKGs/Labs/Other Studies Reviewed:    The following studies were reviewed today: Outside records  January 13, 2020 EKG personally reviewed Sinus rhythm.  EKG:  The ekg ordered today demonstrates ***  Recent Labs: No results found for requested labs within last 8760 hours.  Recent Lipid Panel No results found for: CHOL, TRIG, HDL, CHOLHDL, VLDL, LDLCALC, LDLDIRECT  Physical Exam:    VS:  There were no vitals taken for this visit.    Wt Readings from Last 3 Encounters:  01/13/20 106 lb 1.6 oz (48.1 kg)  12/14/19 120 lb (54.4 kg)     GEN: *** Well nourished, well developed in no acute distress HEENT: Normal NECK: No JVD; No carotid bruits LYMPHATICS: No lymphadenopathy CARDIAC: ***RRR, no murmurs, rubs, gallops RESPIRATORY:  Clear to auscultation without rales, wheezing or rhonchi  ABDOMEN: Soft, non-tender, non-distended MUSCULOSKELETAL:  No edema; No deformity  SKIN: Warm and dry NEUROLOGIC:  Alert and oriented x 3 PSYCHIATRIC:  Normal affect   ASSESSMENT:    No diagnosis found. PLAN:    In order of problems listed above:  Total time spent with patient today *** minutes. This includes reviewing records, evaluating the patient and coordinating care.  Medication Adjustments/Labs and Tests Ordered: Current medicines are reviewed at length with the patient today.  Concerns regarding medicines are outlined above.  No orders of the defined types were placed in this encounter.  No orders of the defined types were placed in this encounter.    Signed, Rossie Muskrat. Lalla Brothers, MD, [facility], [facility] 05/21/2021 9:30 AM    Electrophysiology [facility]

## 2021-05-25 DIAGNOSIS — M47896 Other spondylosis, lumbar region: Secondary | ICD-10-CM | POA: Diagnosis not present

## 2021-05-25 DIAGNOSIS — R296 Repeated falls: Secondary | ICD-10-CM | POA: Diagnosis not present

## 2021-05-30 ENCOUNTER — Encounter (HOSPITAL_COMMUNITY): Payer: Self-pay | Admitting: Family Medicine

## 2021-05-30 ENCOUNTER — Inpatient Hospital Stay (HOSPITAL_COMMUNITY)
Admission: EM | Admit: 2021-05-30 | Discharge: 2021-06-04 | DRG: 682 | Disposition: A | Discharge status: Home Health | Payer: Medicare Other | Attending: Internal Medicine | Admitting: Internal Medicine

## 2021-05-30 ENCOUNTER — Emergency Department (HOSPITAL_COMMUNITY): Payer: Medicare Other

## 2021-05-30 DIAGNOSIS — G9341 Metabolic encephalopathy: Secondary | ICD-10-CM | POA: Diagnosis not present

## 2021-05-30 DIAGNOSIS — I471 Supraventricular tachycardia: Secondary | ICD-10-CM | POA: Diagnosis present

## 2021-05-30 DIAGNOSIS — K219 Gastro-esophageal reflux disease without esophagitis: Secondary | ICD-10-CM | POA: Diagnosis not present

## 2021-05-30 DIAGNOSIS — E861 Hypovolemia: Secondary | ICD-10-CM | POA: Diagnosis not present

## 2021-05-30 DIAGNOSIS — E875 Hyperkalemia: Secondary | ICD-10-CM | POA: Diagnosis present

## 2021-05-30 DIAGNOSIS — E860 Dehydration: Secondary | ICD-10-CM | POA: Diagnosis not present

## 2021-05-30 DIAGNOSIS — R531 Weakness: Secondary | ICD-10-CM | POA: Diagnosis not present

## 2021-05-30 DIAGNOSIS — Z743 Need for continuous supervision: Secondary | ICD-10-CM | POA: Diagnosis not present

## 2021-05-30 DIAGNOSIS — M549 Dorsalgia, unspecified: Secondary | ICD-10-CM | POA: Diagnosis present

## 2021-05-30 DIAGNOSIS — R112 Nausea with vomiting, unspecified: Secondary | ICD-10-CM

## 2021-05-30 DIAGNOSIS — E785 Hyperlipidemia, unspecified: Secondary | ICD-10-CM | POA: Diagnosis not present

## 2021-05-30 DIAGNOSIS — R410 Disorientation, unspecified: Secondary | ICD-10-CM | POA: Diagnosis not present

## 2021-05-30 DIAGNOSIS — R2689 Other abnormalities of gait and mobility: Secondary | ICD-10-CM | POA: Diagnosis not present

## 2021-05-30 DIAGNOSIS — D649 Anemia, unspecified: Secondary | ICD-10-CM | POA: Diagnosis not present

## 2021-05-30 DIAGNOSIS — E872 Acidosis, unspecified: Secondary | ICD-10-CM | POA: Diagnosis present

## 2021-05-30 DIAGNOSIS — I10 Essential (primary) hypertension: Secondary | ICD-10-CM | POA: Diagnosis present

## 2021-05-30 DIAGNOSIS — E119 Type 2 diabetes mellitus without complications: Secondary | ICD-10-CM | POA: Diagnosis not present

## 2021-05-30 DIAGNOSIS — E870 Hyperosmolality and hypernatremia: Secondary | ICD-10-CM | POA: Diagnosis not present

## 2021-05-30 DIAGNOSIS — Z8249 Family history of ischemic heart disease and other diseases of the circulatory system: Secondary | ICD-10-CM

## 2021-05-30 DIAGNOSIS — Z20822 Contact with and (suspected) exposure to covid-19: Secondary | ICD-10-CM | POA: Diagnosis present

## 2021-05-30 DIAGNOSIS — G8929 Other chronic pain: Secondary | ICD-10-CM | POA: Diagnosis not present

## 2021-05-30 DIAGNOSIS — Z79899 Other long term (current) drug therapy: Secondary | ICD-10-CM | POA: Diagnosis not present

## 2021-05-30 DIAGNOSIS — R9431 Abnormal electrocardiogram [ECG] [EKG]: Secondary | ICD-10-CM | POA: Diagnosis not present

## 2021-05-30 DIAGNOSIS — R109 Unspecified abdominal pain: Secondary | ICD-10-CM | POA: Diagnosis not present

## 2021-05-30 DIAGNOSIS — I959 Hypotension, unspecified: Secondary | ICD-10-CM | POA: Diagnosis not present

## 2021-05-30 DIAGNOSIS — G252 Other specified forms of tremor: Secondary | ICD-10-CM | POA: Diagnosis present

## 2021-05-30 DIAGNOSIS — R6889 Other general symptoms and signs: Secondary | ICD-10-CM | POA: Diagnosis not present

## 2021-05-30 DIAGNOSIS — N179 Acute kidney failure, unspecified: Secondary | ICD-10-CM | POA: Diagnosis not present

## 2021-05-30 DIAGNOSIS — Z888 Allergy status to other drugs, medicaments and biological substances status: Secondary | ICD-10-CM

## 2021-05-30 DIAGNOSIS — R7989 Other specified abnormal findings of blood chemistry: Secondary | ICD-10-CM | POA: Diagnosis present

## 2021-05-30 DIAGNOSIS — N19 Unspecified kidney failure: Secondary | ICD-10-CM

## 2021-05-30 DIAGNOSIS — R1110 Vomiting, unspecified: Secondary | ICD-10-CM | POA: Diagnosis not present

## 2021-05-30 DIAGNOSIS — Z83438 Family history of other disorder of lipoprotein metabolism and other lipidemia: Secondary | ICD-10-CM

## 2021-05-30 DIAGNOSIS — R001 Bradycardia, unspecified: Secondary | ICD-10-CM | POA: Diagnosis present

## 2021-05-30 DIAGNOSIS — Z7984 Long term (current) use of oral hypoglycemic drugs: Secondary | ICD-10-CM | POA: Diagnosis not present

## 2021-05-30 DIAGNOSIS — R0902 Hypoxemia: Secondary | ICD-10-CM | POA: Diagnosis not present

## 2021-05-30 DIAGNOSIS — R404 Transient alteration of awareness: Secondary | ICD-10-CM | POA: Diagnosis not present

## 2021-05-30 DIAGNOSIS — R296 Repeated falls: Secondary | ICD-10-CM | POA: Diagnosis present

## 2021-05-30 LAB — CBC WITH DIFFERENTIAL/PLATELET
Abs Immature Granulocytes: 0.04 10*3/uL (ref 0.00–0.07)
Basophils Absolute: 0 10*3/uL (ref 0.0–0.1)
Basophils Relative: 0 %
Eosinophils Absolute: 0.1 10*3/uL (ref 0.0–0.5)
Eosinophils Relative: 1 %
HCT: 28.9 % — ABNORMAL LOW (ref 36.0–46.0)
Hemoglobin: 8.7 g/dL — ABNORMAL LOW (ref 12.0–15.0)
Immature Granulocytes: 1 %
Lymphocytes Relative: 18 %
Lymphs Abs: 1.3 10*3/uL (ref 0.7–4.0)
MCH: 26 pg (ref 26.0–34.0)
MCHC: 30.1 g/dL (ref 30.0–36.0)
MCV: 86.5 fL (ref 80.0–100.0)
Monocytes Absolute: 0.7 10*3/uL (ref 0.1–1.0)
Monocytes Relative: 10 %
Neutro Abs: 5 10*3/uL (ref 1.7–7.7)
Neutrophils Relative %: 70 %
Platelets: 254 10*3/uL (ref 150–400)
RBC: 3.34 MIL/uL — ABNORMAL LOW (ref 3.87–5.11)
RDW: 15.2 % (ref 11.5–15.5)
WBC: 7.1 10*3/uL (ref 4.0–10.5)
nRBC: 0 % (ref 0.0–0.2)

## 2021-05-30 LAB — COMPREHENSIVE METABOLIC PANEL
ALT: 30 U/L (ref 0–44)
AST: 37 U/L (ref 15–41)
Albumin: 3.3 g/dL — ABNORMAL LOW (ref 3.5–5.0)
Alkaline Phosphatase: 62 U/L (ref 38–126)
BUN: 122 mg/dL — ABNORMAL HIGH (ref 8–23)
CO2: <7 mmol/L — ABNORMAL LOW (ref 22–32)
Calcium: 8.5 mg/dL — ABNORMAL LOW (ref 8.9–10.3)
Chloride: 117 mmol/L — ABNORMAL HIGH (ref 98–111)
Creatinine, Ser: 6.18 mg/dL — ABNORMAL HIGH (ref 0.44–1.00)
GFR, Estimated: 7 mL/min — ABNORMAL LOW (ref >60)
Glucose, Bld: 96 mg/dL (ref 70–99)
Potassium: 5.7 mmol/L — ABNORMAL HIGH (ref 3.5–5.1)
Sodium: 136 mmol/L (ref 135–145)
Total Bilirubin: 0.5 mg/dL (ref 0.3–1.2)
Total Protein: 6 g/dL — ABNORMAL LOW (ref 6.5–8.1)

## 2021-05-30 LAB — RESP PANEL BY RT-PCR (FLU A&B, COVID) ARPGX2
Influenza A by PCR: NEGATIVE
Influenza B by PCR: NEGATIVE
SARS Coronavirus 2 by RT PCR: NEGATIVE

## 2021-05-30 LAB — TYPE AND SCREEN
ABO/RH(D): O NEG
Antibody Screen: NEGATIVE

## 2021-05-30 LAB — LIPASE, BLOOD: Lipase: 102 U/L — ABNORMAL HIGH (ref 11–51)

## 2021-05-30 LAB — PROTIME-INR
INR: 1.2 (ref 0.8–1.2)
Prothrombin Time: 14.8 seconds (ref 11.4–15.2)

## 2021-05-30 LAB — TROPONIN I (HIGH SENSITIVITY): Troponin I (High Sensitivity): 11 ng/L (ref <18)

## 2021-05-30 IMAGING — DX DG CHEST 1V PORT
1 series · 1 of 1 positions shown · non-contrast
Comparison: [DATE]

CLINICAL DATA: Weakness

EXAM:
PORTABLE CHEST 1 VIEW

[chest ap]
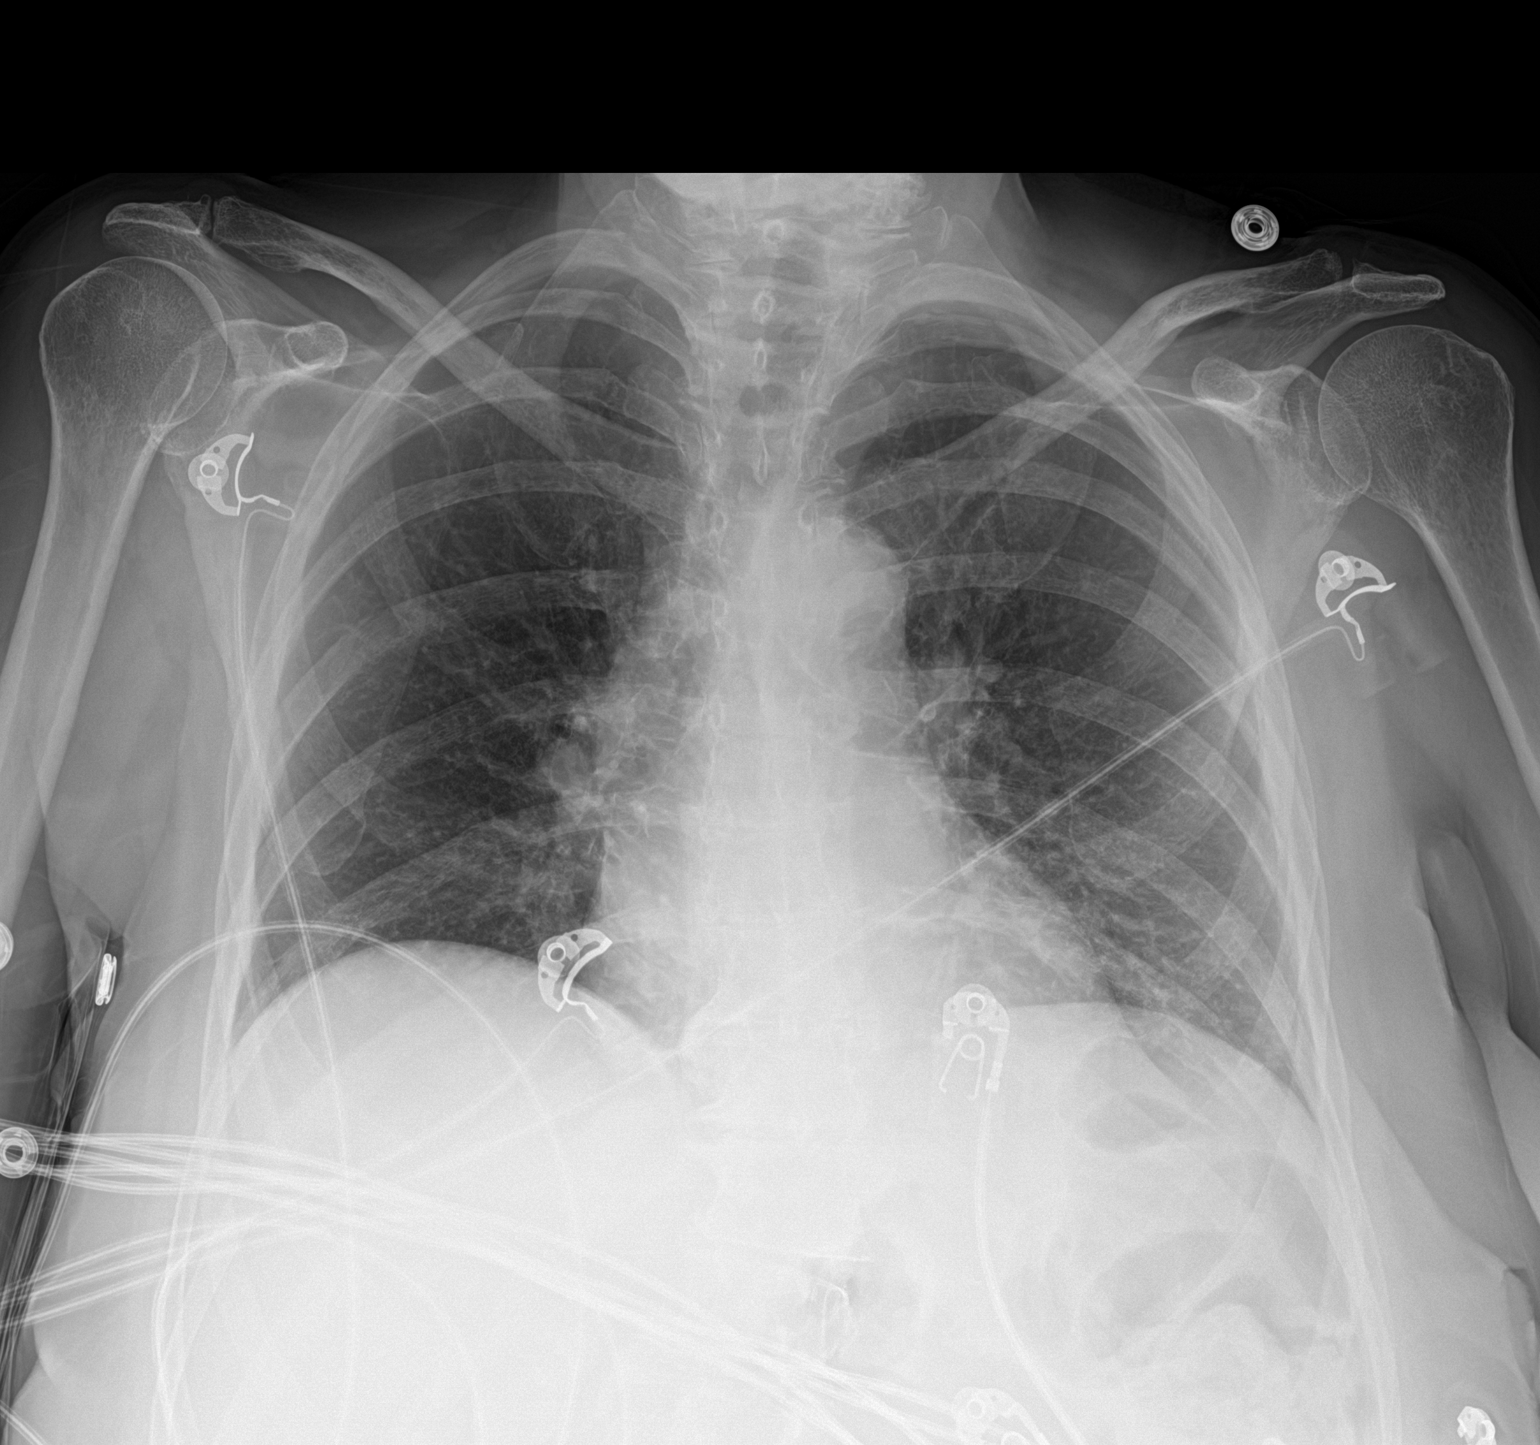

[1 of 1 positions shown; findings below may reference images not displayed]

FINDINGS: The heart size and mediastinal contours are within normal limits.
Both lungs are clear. The visualized skeletal structures are
unremarkable.
IMPRESSION: No active disease.

## 2021-05-30 MED ORDER — SODIUM CHLORIDE 0.9 % IV BOLUS
1000.0000 mL | Freq: Once | INTRAVENOUS | Status: AC
  Administered 2021-05-30: 1000 mL via INTRAVENOUS

## 2021-05-30 NOTE — ED Notes (Signed)
Received verbal report from Sophia W RN at this time 

## 2021-05-30 NOTE — ED Provider Notes (Signed)
[facility] EMERGENCY DEPARTMENT Provider Note   CSN: [id number] Arrival date & time: 05/30/21  1943     History No chief complaint on file.   Sabrina Rodriguez is a 70 y.o. female.  70 year old female with prior medical history as detailed below presents for evaluation.  Patient with reported worsening weakness over the last week.  Patient reports feeling weak and tired.  Patient contacted EMS today.  EMS noted hypotension in the field.  IV fluids administered in route.  Patient denies nausea or vomiting.  Patient denies pain.  Patient denies bleeding episodes.  Patient does complain of a mild tremor in both hands.  She reports that it makes it difficult for her to "text."  She denies fever.  Patient is alert but mildly confused.  The history is provided by the patient and medical records.  Illness Location:  Weakness, tremor Severity:  Moderate Onset quality:  Gradual Duration:  1 week Timing:  Constant Progression:  Worsening Chronicity:  New     Past Medical History:  Diagnosis Date   Acid reflux    Diabetes mellitus without complication (HCC)    pre diabetes   Diverticulitis    Hypertension     Patient Active Problem List   Diagnosis Date Noted   Confusion    Dehydration    Protein-calorie malnutrition, severe 01/14/2020   AKI (acute kidney injury) (HCC) 01/12/2020   Diverticulitis 12/13/2019   Renal failure 12/13/2019   Metabolic acidosis 12/13/2019   Diabetes mellitus type II, non insulin dependent (HCC) 12/13/2019   Pancreatitis 12/13/2019   Hypertension     No past surgical history on file.   OB History   No obstetric history on file.     Family History  Problem Relation Age of Onset   Lung disease Mother    Hypertension Father    Hyperlipidemia Father    Bowel Disease Sister    Breast cancer Sister    Thyroid cancer Sister     Social History   Tobacco Use   Smoking status: Never   Smokeless tobacco: Never  Substance  Use Topics   Alcohol use: Yes    Comment: occasional glass of wine    Home Medications Prior to Admission medications   Medication Sig Start Date End Date Taking? Authorizing Provider  ALPRAZolam (XANAX) 0.25 MG tablet Take 0.25 mg by mouth 2 (two) times daily as needed for anxiety or sleep.    [provider]  amLODipine (NORVASC) 2.5 MG tablet Take 2.5 mg by mouth daily. 12/03/19   [provider]  DULoxetine (CYMBALTA) 30 MG capsule Take 90 mg by mouth daily. 08/19/19   [provider]  feeding supplement, ENSURE ENLIVE, (ENSURE ENLIVE) LIQD Take 237 mLs by mouth 3 (three) times daily between meals. 01/17/20   Mikhail, Nita Sells, DO  Iron, Ferrous Sulfate, 325 (65 Fe) MG TABS Take 325 mg by mouth 2 (two) times daily. 12/17/19   Alwyn Ren, MD  lisinopril (ZESTRIL) 20 MG tablet Take 20 mg by mouth daily. 11/04/19   [provider]  metFORMIN (GLUCOPHAGE) 500 MG tablet Take 500 mg by mouth at bedtime. 10/07/19   [provider]  Multiple Vitamin (MULTIVITAMIN WITH MINERALS) TABS tablet Take 1 tablet by mouth daily. 01/17/20   Mikhail, Nita Sells, DO  omeprazole (PRILOSEC) 40 MG capsule Take 40 mg by mouth daily. 10/07/19   [provider]  ondansetron (ZOFRAN) 8 MG tablet Take 8 mg by mouth 3 (three) times daily.  01/06/20   [provider]  psyllium (HYDROCIL/METAMUCIL) 95 % PACK Take 1 packet by mouth 2 (two) times daily. 12/17/19   Alwyn Ren, MD  rosuvastatin (CRESTOR) 20 MG tablet Take 20 mg by mouth daily. 10/08/19   [provider]  tiZANidine (ZANAFLEX) 4 MG tablet Take 4 mg by mouth every 8 (eight) hours as needed for muscle spasms.  11/25/19   [provider]    Allergies    Patient has no known allergies.  Review of Systems   Review of Systems  All other systems reviewed and are negative.  Physical Exam Updated Vital Signs BP 101/62   Pulse 83   Temp 98.9 F (37.2 C) (Oral)   Resp 20    Ht 5\' 3"  (1.6 m)   Wt 59 kg   SpO2 100%   BMI 23.03 kg/m   Physical Exam Vitals and nursing note reviewed.  Constitutional:      General: She is not in acute distress.    Appearance: Normal appearance. She is well-developed.  HENT:     Head: Normocephalic and atraumatic.     Mouth/Throat:     Mouth: Mucous membranes are dry.  Eyes:     Conjunctiva/sclera: Conjunctivae normal.     Pupils: Pupils are equal, round, and reactive to light.  Cardiovascular:     Rate and Rhythm: Normal rate and regular rhythm.     Heart sounds: Normal heart sounds.  Pulmonary:     Effort: Pulmonary effort is normal. No respiratory distress.     Breath sounds: Normal breath sounds.  Abdominal:     General: There is no distension.     Palpations: Abdomen is soft.     Tenderness: There is no abdominal tenderness.  Musculoskeletal:        General: No deformity. Normal range of motion.     Cervical back: Normal range of motion and neck supple.  Skin:    General: Skin is warm and dry.  Neurological:     General: No focal deficit present.     Mental Status: She is alert and oriented to person, place, and time.    ED Results / Procedures / Treatments   Labs (all labs ordered are listed, but only abnormal results are displayed) Labs Reviewed  COMPREHENSIVE METABOLIC PANEL - Abnormal; Notable for the following components:      Result Value   Potassium 5.7 (*)    Chloride 117 (*)    CO2 <7 (*)    BUN 122 (*)    Creatinine, Ser 6.18 (*)    Calcium 8.5 (*)    Total Protein 6.0 (*)    Albumin 3.3 (*)    GFR, Estimated 7 (*)    All other components within normal limits  LIPASE, BLOOD - Abnormal; Notable for the following components:   Lipase 102 (*)    All other components within normal limits  CBC WITH DIFFERENTIAL/PLATELET - Abnormal; Notable for the following components:   RBC 3.34 (*)    Hemoglobin 8.7 (*)    HCT 28.9 (*)    All other components within normal limits  RESP PANEL BY RT-PCR  (FLU A&B, COVID) [id number]  PROTIME-INR  TYPE AND SCREEN  TROPONIN I (HIGH SENSITIVITY)  TROPONIN I (HIGH SENSITIVITY)    EKG EKG Interpretation  Date/Time:  Sunday May 30 2021 19:59:10 EDT Ventricular Rate:  81 PR Interval:  156 QRS Duration: 101 QT Interval:  363 QTC Calculation: 422 R Axis:  7 Text Interpretation: Sinus rhythm Atrial premature complex Confirmed by Kristine Royal [phone]) on 05/30/2021 8:10:26 PM  Radiology DG Chest Port 1 View  Result Date: 05/30/2021 CLINICAL DATA:  Weakness EXAM: PORTABLE CHEST 1 VIEW COMPARISON:  01/12/2020 FINDINGS: The heart size and mediastinal contours are within normal limits. Both lungs are clear. The visualized skeletal structures are unremarkable. IMPRESSION: No active disease. Electronically Signed   By: Deatra Robinson M.D.   On: 05/30/2021 20:30    Procedures Procedures   Medications Ordered in ED Medications  sodium chloride 0.9 % bolus 1,000 mL (0 mLs Intravenous Stopped 05/30/21 2118)  sodium chloride 0.9 % bolus 1,000 mL (1,000 mLs Intravenous New Bag/Given 05/30/21 2119)    ED Course  I have reviewed the triage vital signs and the nursing notes.  Pertinent labs & imaging results that were available during my care of the patient were reviewed by me and considered in my medical decision making (see chart for details).    MDM Rules/Calculators/A&P                          MDM  MSE complete  Zareena Radloff was evaluated in Emergency Department on 05/30/2021 for the symptoms described in the history of present illness. She was evaluated in the context of the global COVID-19 pandemic, which necessitated consideration that the patient might be at risk for infection with the SARS-CoV-2 virus that causes COVID-19. Institutional protocols and algorithms that pertain to the evaluation of patients at risk for COVID-19 are in a state of rapid change based on information released by regulatory bodies including the CDC and federal and state  organizations. These policies and algorithms were followed during the patient's care in the ED.  Patient is presenting with complaint of generalized weakness.  Patient was noted to be hypotensive in the field with EMS.  Exam is suggestive of moderate dehydration  Labs are significant for elevated creatinine and BUN.  Foley placed and clear urine obtained.  IV fluid administered in the ED.  Case discussed patient with Dr. Arlean Hopping (Nephrology).  He will be happy to consult.  Patient would benefit from admission for further work-up and treatment.  Hospitalist service is aware of case and will evaluate for same.    Final Clinical Impression(s) / ED Diagnoses Final diagnoses:  None    Rx / DC Orders ED Discharge Orders     None        Wynetta Fines, MD 05/30/21 2220
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
## 2021-05-31 ENCOUNTER — Other Ambulatory Visit: Payer: Self-pay

## 2021-05-31 ENCOUNTER — Inpatient Hospital Stay (HOSPITAL_COMMUNITY): Payer: Medicare Other

## 2021-05-31 DIAGNOSIS — N19 Unspecified kidney failure: Secondary | ICD-10-CM | POA: Diagnosis present

## 2021-05-31 LAB — BASIC METABOLIC PANEL
Anion gap: 9 (ref 5–15)
BUN: 107 mg/dL — ABNORMAL HIGH (ref 8–23)
CO2: 8 mmol/L — ABNORMAL LOW (ref 22–32)
Calcium: 7.8 mg/dL — ABNORMAL LOW (ref 8.9–10.3)
Chloride: 123 mmol/L — ABNORMAL HIGH (ref 98–111)
Creatinine, Ser: 5.12 mg/dL — ABNORMAL HIGH (ref 0.44–1.00)
GFR, Estimated: 9 mL/min — ABNORMAL LOW (ref >60)
Glucose, Bld: 67 mg/dL — ABNORMAL LOW (ref 70–99)
Potassium: 4.9 mmol/L (ref 3.5–5.1)
Sodium: 140 mmol/L (ref 135–145)

## 2021-05-31 LAB — CBG MONITORING, ED
Glucose-Capillary: 58 mg/dL — ABNORMAL LOW (ref 70–99)
Glucose-Capillary: 132 mg/dL — ABNORMAL HIGH (ref 70–99)
Glucose-Capillary: 111 mg/dL — ABNORMAL HIGH (ref 70–99)

## 2021-05-31 LAB — GLUCOSE, CAPILLARY
Glucose-Capillary: 141 mg/dL — ABNORMAL HIGH (ref 70–99)
Glucose-Capillary: 104 mg/dL — ABNORMAL HIGH (ref 70–99)

## 2021-05-31 LAB — CREATININE, URINE, RANDOM: Creatinine, Urine: 65.83 mg/dL

## 2021-05-31 LAB — RPR: RPR Ser Ql: NONREACTIVE

## 2021-05-31 LAB — URINALYSIS, COMPLETE (UACMP) WITH MICROSCOPIC
Bilirubin Urine: NEGATIVE
Glucose, UA: NEGATIVE mg/dL
Ketones, ur: NEGATIVE mg/dL
Nitrite: NEGATIVE
Protein, ur: 30 mg/dL — AB
Specific Gravity, Urine: 1.011 (ref 1.005–1.030)
WBC, UA: >50 WBC/hpf — ABNORMAL HIGH (ref 0–5)
pH: 5 (ref 5.0–8.0)

## 2021-05-31 LAB — CBC
HCT: 31.4 % — ABNORMAL LOW (ref 36.0–46.0)
Hemoglobin: 9.3 g/dL — ABNORMAL LOW (ref 12.0–15.0)
MCH: 25.6 pg — ABNORMAL LOW (ref 26.0–34.0)
MCHC: 29.6 g/dL — ABNORMAL LOW (ref 30.0–36.0)
MCV: 86.5 fL (ref 80.0–100.0)
Platelets: 215 10*3/uL (ref 150–400)
RBC: 3.63 MIL/uL — ABNORMAL LOW (ref 3.87–5.11)
RDW: 15.3 % (ref 11.5–15.5)
WBC: 5.2 10*3/uL (ref 4.0–10.5)
nRBC: 0 % (ref 0.0–0.2)

## 2021-05-31 LAB — IRON AND TIBC
Iron: 41 ug/dL (ref 28–170)
Saturation Ratios: 17 % (ref 10.4–31.8)
TIBC: 239 ug/dL — ABNORMAL LOW (ref 250–450)
UIBC: 198 ug/dL

## 2021-05-31 LAB — CK: Total CK: 23 U/L — ABNORMAL LOW (ref 38–234)

## 2021-05-31 LAB — RETICULOCYTES
Immature Retic Fract: 18.1 % — ABNORMAL HIGH (ref 2.3–15.9)
RBC.: 3.61 MIL/uL — ABNORMAL LOW (ref 3.87–5.11)
Retic Count, Absolute: 81.9 10*3/uL (ref 19.0–186.0)
Retic Ct Pct: 2.3 % (ref 0.4–3.1)

## 2021-05-31 LAB — TSH: TSH: 2.177 u[IU]/mL (ref 0.350–4.500)

## 2021-05-31 LAB — VITAMIN B12: Vitamin B-12: >7500 pg/mL — ABNORMAL HIGH (ref 180–914)

## 2021-05-31 LAB — MAGNESIUM: Magnesium: 1.6 mg/dL — ABNORMAL LOW (ref 1.7–2.4)

## 2021-05-31 LAB — AMMONIA: Ammonia: 18 umol/L (ref 9–35)

## 2021-05-31 LAB — FERRITIN: Ferritin: 100 ng/mL (ref 11–307)

## 2021-05-31 LAB — FOLATE: Folate: 78.6 ng/mL (ref >5.9)

## 2021-05-31 LAB — HEMOGLOBIN A1C
Hgb A1c MFr Bld: 6.4 % — ABNORMAL HIGH (ref 4.8–5.6)
Mean Plasma Glucose: 136.98 mg/dL

## 2021-05-31 LAB — PHOSPHORUS: Phosphorus: 8.3 mg/dL — ABNORMAL HIGH (ref 2.5–4.6)

## 2021-05-31 LAB — HIV ANTIBODY (ROUTINE TESTING W REFLEX): HIV Screen 4th Generation wRfx: NONREACTIVE

## 2021-05-31 LAB — TROPONIN I (HIGH SENSITIVITY): Troponin I (High Sensitivity): 13 ng/L (ref <18)

## 2021-05-31 LAB — SODIUM, URINE, RANDOM: Sodium, Ur: 47 mmol/L

## 2021-05-31 IMAGING — US US RENAL
1 series · 14 of 25 positions shown · non-contrast
Comparison: None.

CLINICAL DATA: Acute renal failure

EXAM:
RENAL / URINARY TRACT ULTRASOUND COMPLETE

[Series 1: us renal · 14 of 46 slices shown]
[im 1/46]
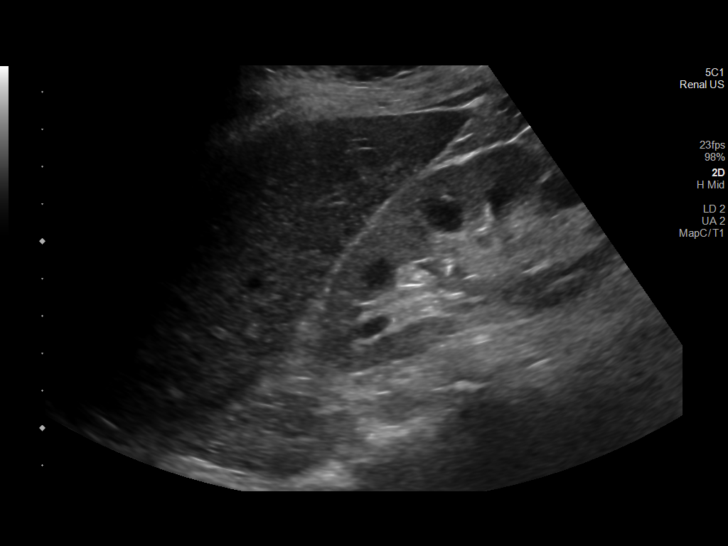
[im 4/46]
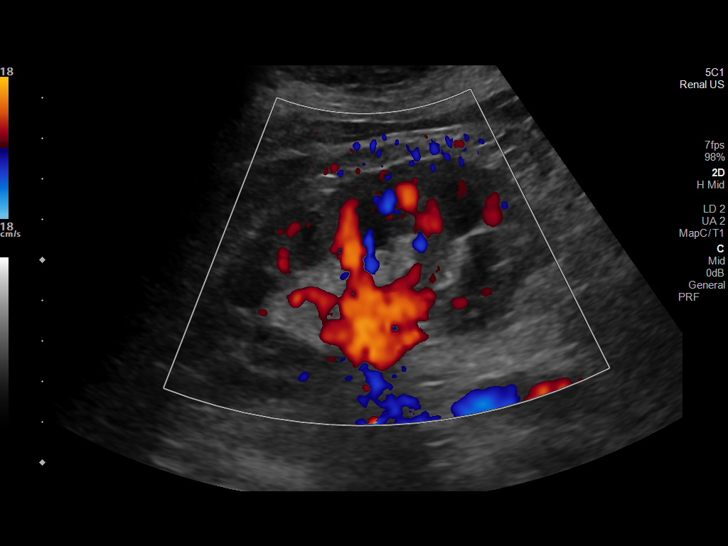
[im 8/46]
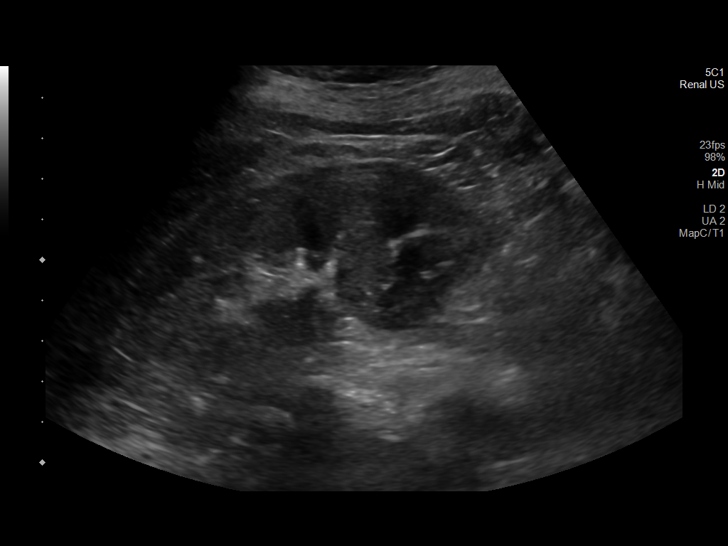
[im 12/46]
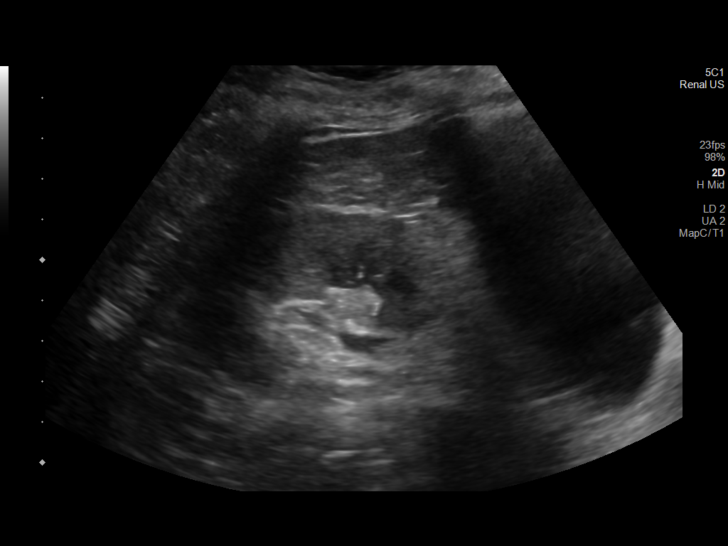
[im 16/46]
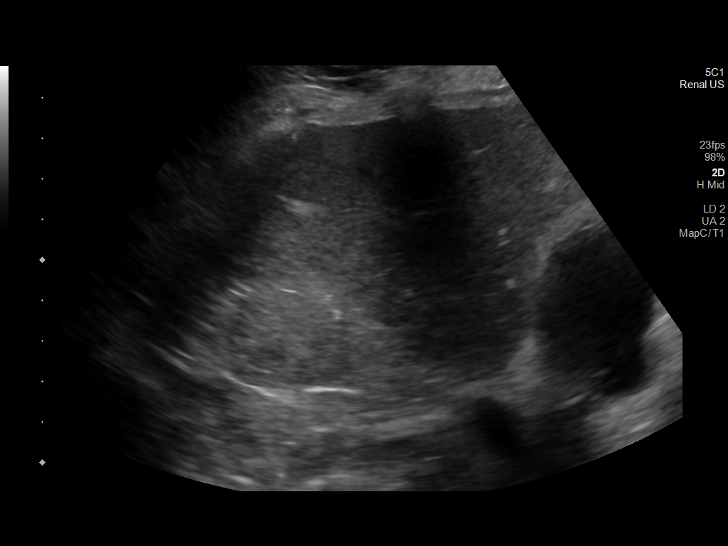
[im 17/46]
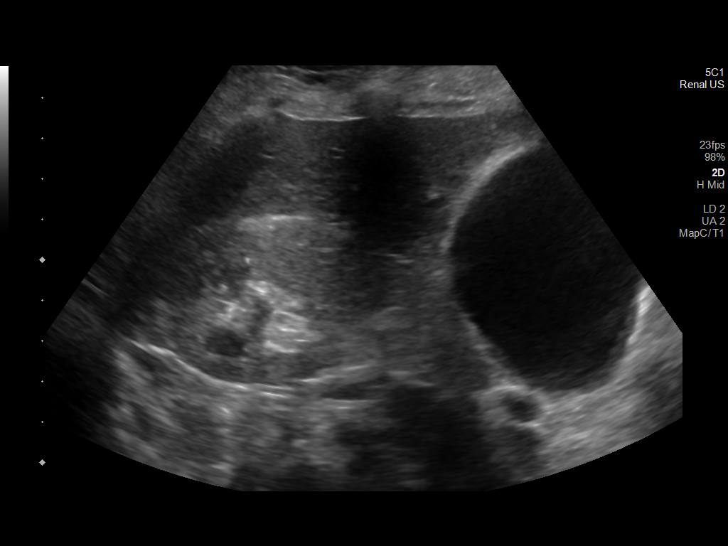
[im 21/46]
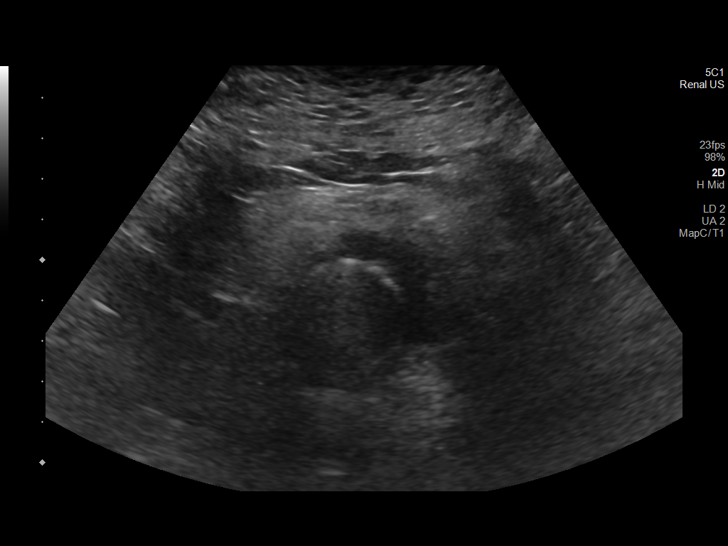
[im 25/46]
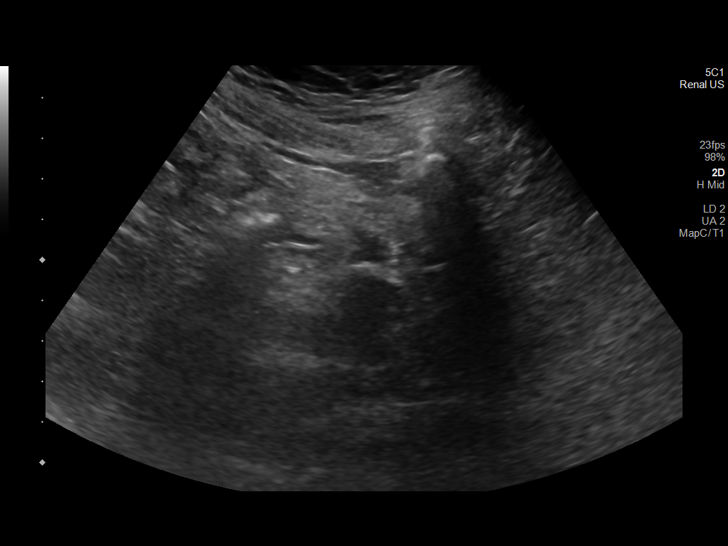
[im 29/46]
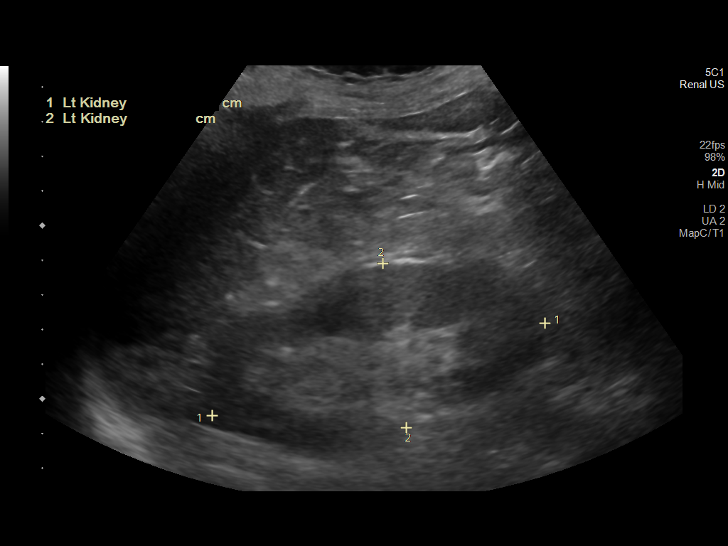
[im 31/46]
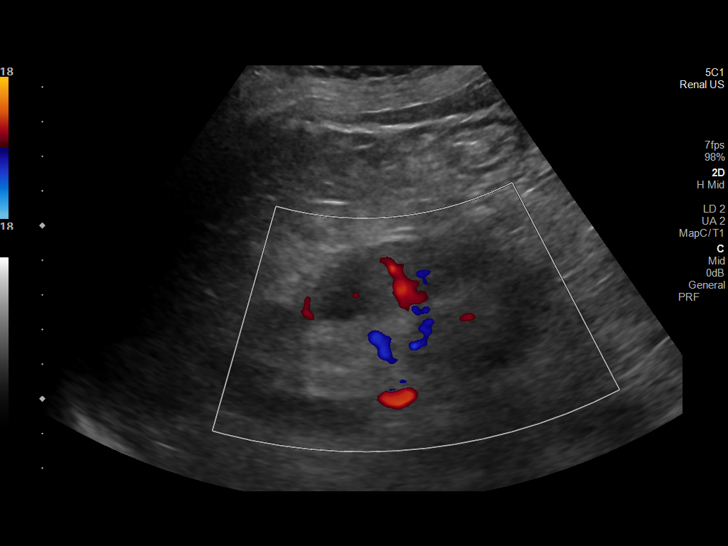
[im 34/46]
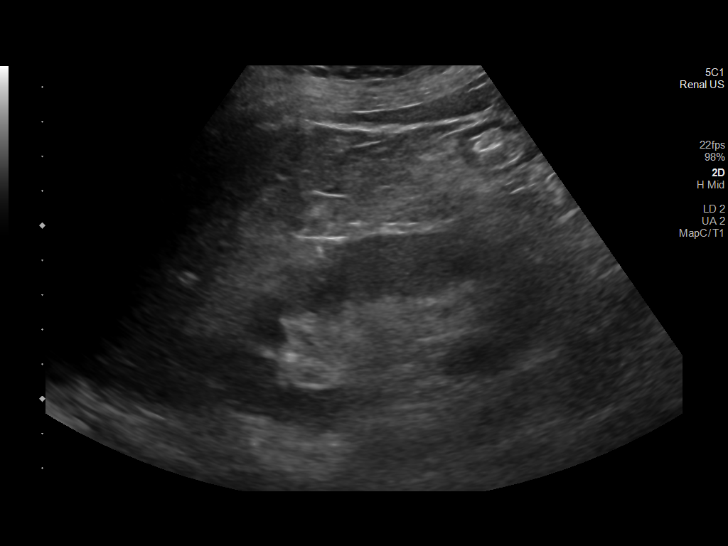
[im 38/46]
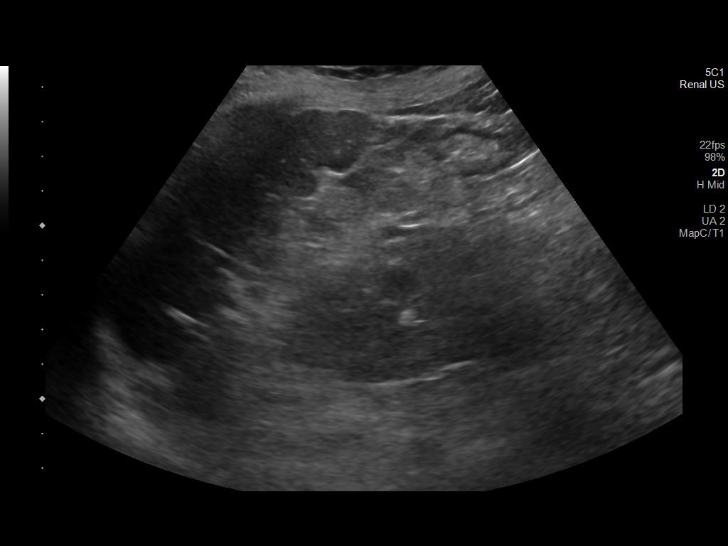
[im 42/46]
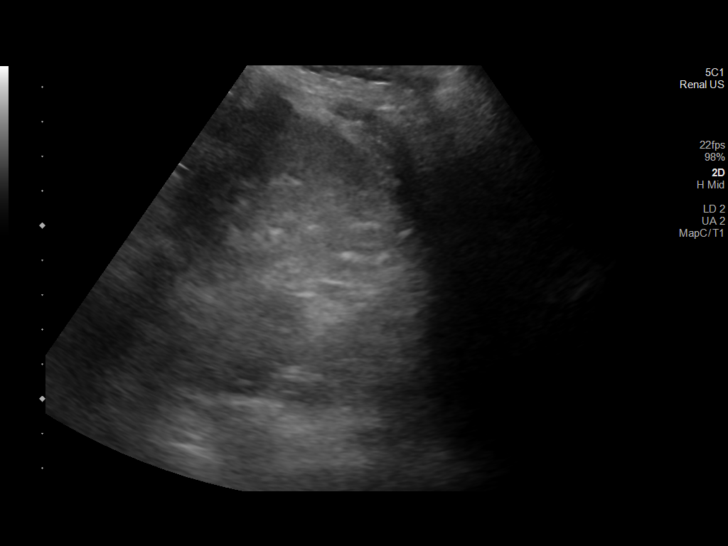
[im 46/46]
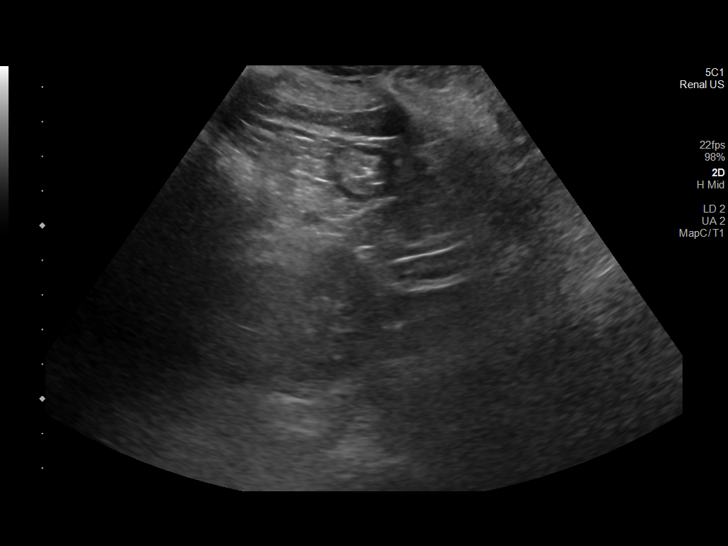

[14 of 25 positions shown; findings below may reference images not displayed]

FINDINGS: Right Kidney:

Renal measurements: 9.9 x 4.1 x 6.2 cm = volume: 141.6 mL. Echogenic
renal parenchyma. No mass or hydronephrosis visualized. Trace
perinephric fluid.

Left Kidney:

Renal measurements: 10.0 x 4.8 x 5.9 cm = volume: 148.5 mL.
Echogenic renal parenchyma. No mass or hydronephrosis visualized.
Trace perinephric fluid.

Bladder:

Decompressed by indwelling Foley catheter.

Other:

None.
IMPRESSION: Echogenic renal parenchyma, suggesting medical renal disease.

Trace perinephric fluid bilaterally.  No hydronephrosis.

Bladder decompressed by an indwelling Foley catheter.

## 2021-05-31 MED ORDER — HEPARIN SODIUM (PORCINE) 5000 UNIT/ML IJ SOLN
5000.0000 [IU] | Freq: Three times a day (TID) | INTRAMUSCULAR | Status: DC
  Administered 2021-05-31 – 2021-06-04 (×11): 5000 [IU] via SUBCUTANEOUS
  Filled 2021-05-31 (×11): qty 1

## 2021-05-31 MED ORDER — STERILE WATER FOR INJECTION IV SOLN
INTRAVENOUS | Status: AC
  Filled 2021-05-31 (×2): qty 1000
  Filled 2021-05-31: qty 150

## 2021-05-31 MED ORDER — ACETAMINOPHEN 325 MG PO TABS
650.0000 mg | ORAL_TABLET | Freq: Four times a day (QID) | ORAL | Status: DC | PRN
  Administered 2021-05-31 – 2021-06-03 (×3): 650 mg via ORAL
  Filled 2021-05-31 (×3): qty 2

## 2021-05-31 MED ORDER — HALOPERIDOL LACTATE 5 MG/ML IJ SOLN
1.0000 mg | Freq: Four times a day (QID) | INTRAMUSCULAR | Status: DC | PRN
  Administered 2021-05-31 – 2021-06-01 (×2): 1 mg via INTRAVENOUS
  Filled 2021-05-31 (×2): qty 1

## 2021-05-31 MED ORDER — ONDANSETRON HCL 4 MG PO TABS: 4.0000 mg | ORAL_TABLET | Freq: Four times a day (QID) | ORAL | Status: DC | PRN

## 2021-05-31 MED ORDER — INSULIN ASPART 100 UNIT/ML IJ SOLN
0.0000 [IU] | Freq: Three times a day (TID) | INTRAMUSCULAR | Status: DC
  Administered 2021-06-01: 1 [IU] via SUBCUTANEOUS

## 2021-05-31 MED ORDER — ACETAMINOPHEN 650 MG RE SUPP: 650.0000 mg | Freq: Four times a day (QID) | RECTAL | Status: DC | PRN

## 2021-05-31 MED ORDER — ONDANSETRON HCL 4 MG/2ML IJ SOLN
4.0000 mg | Freq: Four times a day (QID) | INTRAMUSCULAR | Status: DC | PRN
  Administered 2021-06-01 – 2021-06-02 (×2): 4 mg via INTRAVENOUS
  Filled 2021-05-31 (×2): qty 2

## 2021-05-31 MED ORDER — STERILE WATER FOR INJECTION IV SOLN
INTRAVENOUS | Status: DC
  Filled 2021-05-31 (×2): qty 1000

## 2021-05-31 NOTE — Progress Notes (Signed)
[facility] PROGRESS NOTE    Progress Note  Sabrina Rodriguez  VVO:[id number] DOB: [birth year] DOA: 05/30/2021 PCP: [name], MD     Brief Narrative:   Sabrina Rodriguez is an 70 y.o. female past medical history significant for essential hypertension, diabetes mellitus type 2 chronic back and knee pain comes into the emergency room for fatigue, confusion loss of appetite, EMS was called this patient was found hypotensive fluid resuscitated at the scene in the ED was found to be in acute kidney injury and hyperkalemia Foley catheter was placed and an output of 2 L of urine came out.   Assessment/Plan:   AKI : Renal ultrasound showed chronic medical disease. He was started sterile water with bicarbonate. 2 L of urine output when Foley was placed. Creatinine is slowly improving. Will continue IV fluids.  His hyperkalemia has resolved. He Is about 2 L positive.  Normal anion gap metabolic acidosis: Likely due to renal disease, continue IV bicarbonate. Slowly improving.  Metabolic encephalopathy: 1 week of confusion no neurological deficits on physical exam likely uremia.  Normocytic anemia: No signs of overt bleeding.  His hemoglobin appear to be at baseline, expect hemodilution affect after fluid hydration. No signs of overt bleeding. Anemia panel pending.  Essential hypertension: Low in the field, antihypertensive medications were held he was started on fluid resuscitation blood pressure is improved.  Diabetes mellitus type II, non insulin dependent (HCC) With an A1c of 6.5 last year, continue sliding scale insulin monitor glucose.   DVT prophylaxis: lovenox Family Communication:none Status is: Inpatient  Remains inpatient appropriate because:Hemodynamically unstable  Dispo: The patient is from: Home              Anticipated d/c is to: Home              Patient currently is not medically stable to d/c.   Difficult to place patient No   Code Status:     Code  Status Orders  (From admission, onward)           Start     Ordered   05/31/21 0003  Full code  Continuous        05/31/21 0006           Code Status History     Date Active Date Inactive Code Status Order ID Comments User Context   01/12/2020 2210 01/17/2020 1605 Full Code [id number]  Eduard Clos, MD ED   12/14/2019 0013 12/17/2019 1853 Full Code [id number]  Opyd, Lavone Neri, MD ED         IV Access:   Peripheral IV   Procedures and diagnostic studies:   US RENAL  Result Date: 05/31/2021 CLINICAL DATA:  Acute renal failure EXAM: RENAL / URINARY TRACT ULTRASOUND COMPLETE COMPARISON:  None. FINDINGS: Right Kidney: Renal measurements: 9.9 x 4.1 x 6.2 cm = volume: 141.6 mL. Echogenic renal parenchyma. No mass or hydronephrosis visualized. Trace perinephric fluid. Left Kidney: Renal measurements: 10.0 x 4.8 x 5.9 cm = volume: 148.5 mL. Echogenic renal parenchyma. No mass or hydronephrosis visualized. Trace perinephric fluid. Bladder: Decompressed by indwelling Foley catheter. Other: None. IMPRESSION: Echogenic renal parenchyma, suggesting medical renal disease. Trace perinephric fluid bilaterally.  No hydronephrosis. Bladder decompressed by an indwelling Foley catheter. Electronically Signed   By: Charline Bills M.D.   On: 05/31/2021 01:35   DG Chest Port 1 View  Result Date: 05/30/2021 CLINICAL DATA:  Weakness EXAM: PORTABLE CHEST 1 VIEW COMPARISON:  01/12/2020 FINDINGS: The heart size and  mediastinal contours are within normal limits. Both lungs are clear. The visualized skeletal structures are unremarkable. IMPRESSION: No active disease. Electronically Signed   By: Deatra Robinson M.D.   On: 05/30/2021 20:30     Medical Consultants:   None.   Subjective:    Rorey Tubbs in no acute distress.  Objective:    Vitals:   05/31/21 0500 05/31/21 0515 05/31/21 0600 05/31/21 0645  BP: (!) 113/54 (!) 119/55 (!) 110/58 (!) 101/54  Pulse: 93 88 93 95  Resp: 15 19 19 17    Temp:      TempSrc:      SpO2: 100% 100% 99% 100%  Weight:      Height:       SpO2: 100 %   Intake/Output Summary (Last 24 hours) at 05/31/2021 0706 Last data filed at 05/31/2021 0022 Gross per 24 hour  Intake 4046.95 ml  Output --  Net 4046.95 ml   Filed Weights   05/30/21 1952  Weight: 59 kg    Exam: General exam: In no acute distress. Respiratory system: Good air movement and clear to auscultation. Cardiovascular system: S1 & S2 heard, RRR. No JVD. Gastrointestinal system: Abdomen is nondistended, soft and nontender.  Extremities: No pedal edema. Skin: No rashes, lesions or ulcers Psychiatry: No judgement and insight appear normal.   Data Reviewed:    Labs: Basic Metabolic Panel: Recent Labs  Lab 05/30/21 1954 05/31/21 0402  NA 136 140  K 5.7* 4.9  CL 117* 123*  CO2 <7* 8*  GLUCOSE 96 67*  BUN 122* 107*  CREATININE 6.18* 5.12*  CALCIUM 8.5* 7.8*  MG  --  1.6*  PHOS  --  8.3*   GFR Estimated Creatinine Clearance: 8.7 mL/min (A) (by C-G formula based on SCr of 5.12 mg/dL (H)). Liver Function Tests: Recent Labs  Lab 05/30/21 1954  AST 37  ALT 30  ALKPHOS 62  BILITOT 0.5  PROT 6.0*  ALBUMIN 3.3*   Recent Labs  Lab 05/30/21 1954  LIPASE 102*   Recent Labs  Lab 05/31/21 0402  AMMONIA 18   Coagulation profile Recent Labs  Lab 05/30/21 1954  INR 1.2   COVID-19 Labs  Recent Labs    05/31/21 0402  FERRITIN 100    Lab Results  Component Value Date   SARSCOV2NAA NEGATIVE 05/30/2021   SARSCOV2NAA NEGATIVE 01/12/2020   SARSCOV2NAA NEGATIVE 12/13/2019    CBC: Recent Labs  Lab 05/30/21 1954 05/31/21 0402  WBC 7.1 5.2  NEUTROABS 5.0  --   HGB 8.7* 9.3*  HCT 28.9* 31.4*  MCV 86.5 86.5  PLT 254 215   Cardiac Enzymes: No results for input(s): CKTOTAL, CKMB, CKMBINDEX, TROPONINI in the last 168 hours. BNP (last 3 results) No results for input(s): PROBNP in the last 8760 hours. CBG: No results for input(s): GLUCAP in the last  168 hours. D-Dimer: No results for input(s): DDIMER in the last 72 hours. Hgb A1c: Recent Labs    05/31/21 0402  HGBA1C 6.4*   Lipid Profile: No results for input(s): CHOL, HDL, LDLCALC, TRIG, CHOLHDL, LDLDIRECT in the last 72 hours. Thyroid function studies: Recent Labs    05/31/21 0402  TSH 2.177   Anemia work up: Recent Labs    05/31/21 0402  VITAMINB12 >7,500*  FERRITIN 100  TIBC 239*  IRON 41  RETICCTPCT 2.3   Sepsis Labs: Recent Labs  Lab 05/30/21 1954 05/31/21 0402  WBC 7.1 5.2   Microbiology Recent Results (from the past 240 hour(s))  Resp  Panel by RT-PCR (Flu A&B, Covid) Nasopharyngeal Swab     Status: None   Collection Time: 05/30/21  8:00 PM   Specimen: Nasopharyngeal Swab; Nasopharyngeal(NP) swabs in vial transport medium  Result Value Ref Range Status   SARS Coronavirus 2 by RT PCR NEGATIVE NEGATIVE Final    Comment: (NOTE) SARS-CoV-2 target nucleic acids are NOT DETECTED.  The SARS-CoV-2 RNA is generally detectable in upper respiratory specimens during the acute phase of infection. The lowest concentration of SARS-CoV-2 viral copies this assay can detect is 138 copies/mL. A negative result does not preclude SARS-Cov-2 infection and should not be used as the sole basis for treatment or other patient management decisions. A negative result may occur with  improper specimen collection/handling, submission of specimen other than nasopharyngeal swab, presence of viral mutation(s) within the areas targeted by this assay, and inadequate number of viral copies(<138 copies/mL). A negative result must be combined with clinical observations, patient history, and epidemiological information. The expected result is Negative.  Fact Sheet for Patients:  BloggerCourse.com  Fact Sheet for Healthcare Providers:  SeriousBroker.it  This test is no t yet approved or cleared by the [place] FDA and  has been  authorized for detection and/or diagnosis of SARS-CoV-2 by FDA under an Emergency Use Authorization (EUA). This EUA will remain  in effect (meaning this test can be used) for the duration of the COVID-19 declaration under Section 564(b)(1) of the Act, 21 U.S.C.section 360bbb-3(b)(1), unless the authorization is terminated  or revoked sooner.       Influenza A by PCR NEGATIVE NEGATIVE Final   Influenza B by PCR NEGATIVE NEGATIVE Final    Comment: (NOTE) The Xpert Xpress SARS-CoV-2/FLU/RSV plus assay is intended as an aid in the diagnosis of influenza from Nasopharyngeal swab specimens and should not be used as a sole basis for treatment. Nasal washings and aspirates are unacceptable for Xpert Xpress SARS-CoV-2/FLU/RSV testing.  Fact Sheet for Patients: BloggerCourse.com  Fact Sheet for Healthcare Providers: SeriousBroker.it  This test is not yet approved or cleared by the [place] FDA and has been authorized for detection and/or diagnosis of SARS-CoV-2 by FDA under an Emergency Use Authorization (EUA). This EUA will remain in effect (meaning this test can be used) for the duration of the COVID-19 declaration under Section 564(b)(1) of the Act, 21 U.S.C. section 360bbb-3(b)(1), unless the authorization is terminated or revoked.  Performed at [facility], [address] [place], [place] [address]      Medications:    heparin  5,000 Units Subcutaneous Q8H   insulin aspart  0-6 Units Subcutaneous TID WC   Continuous Infusions:   sodium bicarbonate (isotonic) infusion in sterile water 125 mL/hr at 05/31/21 0129      LOS: 1 day   Marinda Elk  [facility]  05/31/2021, 7:06 AM

## 2021-05-31 NOTE — ED Notes (Signed)
Patient attempted to remove heart monitor leads and get out of bed. Patient high fall risk and is confused; will contact admitting provider.
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
## 2021-05-31 NOTE — ED Notes (Signed)
Admitting paged to RN per her request 

## 2021-05-31 NOTE — ED Notes (Signed)
CBG rechecked by Wes, NT. Pt CBG now 132.

## 2021-05-31 NOTE — ED Notes (Signed)
Attempted to give report to 2W; RN was unable to take report from me. I asked to give report to the Charge RN but was hung up on.

## 2021-05-31 NOTE — ED Notes (Signed)
Going to room and check on pt due to monitor alarm and found pt at the edge of the bed trying to get out of the bed. Pt stated that there was blood in her urine. Advised pt that there was not blood in her urine. Pt placed back into the bed with other RN's assistance and charge nurse assistance. Pt was placed on [place] 2lpm O2 due to decrease O2 sats while sleeping. Reeducated pt on staying in the bed and not getting out without assistance and to use the call bell for any needs. Pt verbalized understanding. Pt was also able to answer all questions appropriately at this time.

## 2021-05-31 NOTE — H&P (Signed)
History and Physical    Sabrina SoldersDebbie Osika ZOX:[id number] DOB: 07/02/1951 DOA: 05/30/2021  PCP: Merri BrunetteSmith, Candace, MD   Patient coming from: Home   Chief Complaint: Fatigue, confusion, loss of appetite   HPI: Sabrina Rodriguez is a 70 y.o. female with medical history significant for hypertension, type 2 diabetes mellitus, and chronic back and knee pain, now presenting to the emergency department for evaluation of fatigue, confusion, and loss of appetite.  Patient's daughter assists with the history, and reports that the patient lives alone and has been struggling with worsening back and knee pain and decreased mobility over the past several weeks and then fatigue and loss of appetite more recently.  Over the past 1 week, she has been increasingly confused and did not recognize her daughter today.  Aside from her chronic back and knee pain, she has not had any specific complaints.  Family does not believe that her medications have changed recently and believe that she has continued to take her prescribed medications.  No recent fall, trauma, melena, hematochezia, vomiting, or diarrhea reported.  EMS was called, found patient's blood pressure to be 88/40, and administered 500 cc of IVF prior to arrival in the ED.  ED Course: Upon arrival to the ED, patient is found to be afebrile, saturating well on room air, and with systolic blood pressure in the low 100s.  EKG features sinus rhythm with PAC.  Chest x-ray negative for acute cardiopulmonary disease.  CBC with stable normocytic anemia.  Chemistry panel with potassium 5.7, bicarbonate <7, BUN 122, and creatinine 6.18, up from 0.77 in February 2021.  Nephrology was consulted by the ED physician, Foley catheter was placed, and the patient was given 2 L of saline.  Review of Systems:  All other systems reviewed and apart from HPI, are negative.  Past Medical History:  Diagnosis Date   Acid reflux    Diabetes mellitus without complication (HCC)    pre diabetes    Diverticulitis    Hypertension     History reviewed. No pertinent surgical history.  Social History:   reports that she has never smoked. She has never used smokeless tobacco. She reports current alcohol use. No history on file for drug use.  No Known Allergies  Family History  Problem Relation Age of Onset   Lung disease Mother    Hypertension Father    Hyperlipidemia Father    Bowel Disease Sister    Breast cancer Sister    Thyroid cancer Sister      Prior to Admission medications   Medication Sig Start Date End Date Taking? Authorizing Provider  ALPRAZolam (XANAX) 0.25 MG tablet Take 0.25 mg by mouth 2 (two) times daily as needed for anxiety or sleep.    [provider]  amLODipine (NORVASC) 2.5 MG tablet Take 2.5 mg by mouth daily. 12/03/19   [provider]  DULoxetine (CYMBALTA) 30 MG capsule Take 90 mg by mouth daily. 08/19/19   [provider]  feeding supplement, ENSURE ENLIVE, (ENSURE ENLIVE) LIQD Take 237 mLs by mouth 3 (three) times daily between meals. 01/17/20   Mikhail, Nita SellsMaryann, DO  Iron, Ferrous Sulfate, 325 (65 Fe) MG TABS Take 325 mg by mouth 2 (two) times daily. 12/17/19   Alwyn RenMathews, Elizabeth G, MD  lisinopril (ZESTRIL) 20 MG tablet Take 20 mg by mouth daily. 11/04/19   [provider]  metFORMIN (GLUCOPHAGE) 500 MG tablet Take 500 mg by mouth at bedtime. 10/07/19   [provider]  Multiple Vitamin (MULTIVITAMIN  WITH MINERALS) TABS tablet Take 1 tablet by mouth daily. 01/17/20   Mikhail, Nita Sells, DO  omeprazole (PRILOSEC) 40 MG capsule Take 40 mg by mouth daily. 10/07/19   [provider]  ondansetron (ZOFRAN) 8 MG tablet Take 8 mg by mouth 3 (three) times daily. 01/06/20   [provider]  psyllium (HYDROCIL/METAMUCIL) 95 % PACK Take 1 packet by mouth 2 (two) times daily. 12/17/19   Alwyn Ren, MD  rosuvastatin (CRESTOR) 20 MG tablet Take 20 mg by mouth daily. 10/08/19   [provider]   tiZANidine (ZANAFLEX) 4 MG tablet Take 4 mg by mouth every 8 (eight) hours as needed for muscle spasms.  11/25/19   [provider]    Physical Exam: Vitals:   05/30/21 1954 05/30/21 2100 05/30/21 2300 05/30/21 2315  BP: (!) 100/55 101/62 (!) 116/58 113/62  Pulse: 83  88 88  Resp: 18 20 18  (!) 23  Temp: 98.9 F (37.2 C)     TempSrc: Oral     SpO2: 100%  100% 96%  Weight:      Height:        Constitutional: NAD, calm  Eyes: PERTLA, lids and conjunctivae normal ENMT: Mucous membranes are moist. Posterior pharynx clear of any exudate or lesions.   Neck:  supple, no masses  Respiratory:  no wheezing, no crackles. No accessory muscle use.  Cardiovascular: S1 & S2 heard, regular rate and rhythm. No extremity edema.  Abdomen: No distension, no tenderness, soft. Bowel sounds active.  Musculoskeletal: no clubbing / cyanosis. No joint deformity upper and lower extremities.   Skin: no significant rashes, lesions, ulcers. Warm, dry, well-perfused. Neurologic: CN 2-12 grossly intact. Sensation intact. Strength 5/5 in all 4 limbs.  Psychiatric: Alert and oriented to person only. Pleasant and cooperative.    Labs and Imaging on Admission: I have personally reviewed following labs and imaging studies  CBC: Recent Labs  Lab 05/30/21 1954  WBC 7.1  NEUTROABS 5.0  HGB 8.7*  HCT 28.9*  MCV 86.5  PLT 254   Basic Metabolic Panel: Recent Labs  Lab 05/30/21 1954  NA 136  K 5.7*  CL 117*  CO2 <7*  GLUCOSE 96  BUN 122*  CREATININE 6.18*  CALCIUM 8.5*   GFR: Estimated Creatinine Clearance: 7.2 mL/min (A) (by C-G formula based on SCr of 6.18 mg/dL (H)). Liver Function Tests: Recent Labs  Lab 05/30/21 1954  AST 37  ALT 30  ALKPHOS 62  BILITOT 0.5  PROT 6.0*  ALBUMIN 3.3*   Recent Labs  Lab 05/30/21 1954  LIPASE 102*   No results for input(s): AMMONIA in the last 168 hours. Coagulation Profile: Recent Labs  Lab 05/30/21 1954  INR 1.2   Cardiac  Enzymes: No results for input(s): CKTOTAL, CKMB, CKMBINDEX, TROPONINI in the last 168 hours. BNP (last 3 results) No results for input(s): PROBNP in the last 8760 hours. HbA1C: No results for input(s): HGBA1C in the last 72 hours. CBG: No results for input(s): GLUCAP in the last 168 hours. Lipid Profile: No results for input(s): CHOL, HDL, LDLCALC, TRIG, CHOLHDL, LDLDIRECT in the last 72 hours. Thyroid Function Tests: No results for input(s): TSH, T4TOTAL, FREET4, T3FREE, THYROIDAB in the last 72 hours. Anemia Panel: No results for input(s): VITAMINB12, FOLATE, FERRITIN, TIBC, IRON, RETICCTPCT in the last 72 hours. Urine analysis:    Component Value Date/Time   COLORURINE YELLOW 01/13/2020 0935   APPEARANCEUR CLEAR 01/13/2020 0935   LABSPEC 1.016 01/13/2020 0935   PHURINE  6.0 01/13/2020 0935   GLUCOSEU NEGATIVE 01/13/2020 0935   HGBUR SMALL (A) 01/13/2020 0935   BILIRUBINUR NEGATIVE 01/13/2020 0935   KETONESUR 20 (A) 01/13/2020 0935   PROTEINUR 30 (A) 01/13/2020 0935   NITRITE NEGATIVE 01/13/2020 0935   LEUKOCYTESUR LARGE (A) 01/13/2020 0935   Sepsis Labs: @LABRCNTIP (procalcitonin:4,lacticidven:4) ) Recent Results (from the past 240 hour(s))  Resp Panel by RT-PCR (Flu A&B, Covid) Nasopharyngeal Swab     Status: None   Collection Time: 05/30/21  8:00 PM   Specimen: Nasopharyngeal Swab; Nasopharyngeal(NP) swabs in vial transport medium  Result Value Ref Range Status   SARS Coronavirus 2 by RT PCR NEGATIVE NEGATIVE Final    Comment: (NOTE) SARS-CoV-2 target nucleic acids are NOT DETECTED.  The SARS-CoV-2 RNA is generally detectable in upper respiratory specimens during the acute phase of infection. The lowest concentration of SARS-CoV-2 viral copies this assay can detect is 138 copies/mL. A negative result does not preclude SARS-Cov-2 infection and should not be used as the sole basis for treatment or other patient management decisions. A negative result may occur with   improper specimen collection/handling, submission of specimen other than nasopharyngeal swab, presence of viral mutation(s) within the areas targeted by this assay, and inadequate number of viral copies(<138 copies/mL). A negative result must be combined with clinical observations, patient history, and epidemiological information. The expected result is Negative.  Fact Sheet for Patients:  07/31/21  Fact Sheet for Healthcare Providers:  BloggerCourse.com  This test is no t yet approved or cleared by the [employer] FDA and  has been authorized for detection and/or diagnosis of SARS-CoV-2 by FDA under an Emergency Use Authorization (EUA). This EUA will remain  in effect (meaning this test can be used) for the duration of the COVID-19 declaration under Section 564(b)(1) of the Act, 21 U.S.C.section 360bbb-3(b)(1), unless the authorization is terminated  or revoked sooner.       Influenza A by PCR NEGATIVE NEGATIVE Final   Influenza B by PCR NEGATIVE NEGATIVE Final    Comment: (NOTE) The Xpert Xpress SARS-CoV-2/FLU/RSV plus assay is intended as an aid in the diagnosis of influenza from Nasopharyngeal swab specimens and should not be used as a sole basis for treatment. Nasal washings and aspirates are unacceptable for Xpert Xpress SARS-CoV-2/FLU/RSV testing.  Fact Sheet for Patients: [place]  Fact Sheet for Healthcare Providers: BloggerCourse.com  This test is not yet approved or cleared by the [employer] FDA and has been authorized for detection and/or diagnosis of SARS-CoV-2 by FDA under an Emergency Use Authorization (EUA). This EUA will remain in effect (meaning this test can be used) for the duration of the COVID-19 declaration under Section 564(b)(1) of the Act, 21 U.S.C. section 360bbb-3(b)(1), unless the authorization is terminated  or revoked.  Performed at [facility], [address] [place], [place]      Radiological Exams on Admission: DG Chest Port 1 View  Result Date: 05/30/2021 CLINICAL DATA:  Weakness EXAM: PORTABLE CHEST 1 VIEW COMPARISON:  01/12/2020 FINDINGS: The heart size and mediastinal contours are within normal limits. Both lungs are clear. The visualized skeletal structures are unremarkable. IMPRESSION: No active disease. Electronically Signed   By: 01/14/2020 M.D.   On: 05/30/2021 20:30    EKG: Independently reviewed. Sinus rhythm, PAC.   Assessment/Plan  1. Acute renal failure; acidosis; uremia; hyperkalemia  - Presents with several weeks of progressive loss of appetite, fatigue, and decreased mobility and then 1 wk of of confusion and  is found to have BUN 122, SCr 6.18, serum bicarb <7, and potassium 5.7  - She appears hypovolemic and was given 2 liters NS in ED  - Nephrology consulting and much appreciated  - Check UA and urine chemistries, check renal US, continue IVF hydration with isotonic bicarbonate, renally-dose medications, monitor strict I/Os, repeat chem panel    2. Acute encephalopathy  - Presents with ~1 wk of confusion and somnolence  - No focal neurologic deficits noted  - Most likely due to renal failure/uremia, plan to continue IVF as above and check TSH, B12, RPR, ammonia   3. Anemia  - Hgb 8.7 on admission, similar to priors  - No overt bleeding  - Anticipate dilutional drop, may need transfusion - Plan to check anemia panel, monitor, transfuse if needed   4. Hypertension  - BP was 88 with EMS, low-normal in ED, and antihypertensives held on admission    5. Type II DM - A1c was 6.5% last year  - Check CBGs and treat as needed    6. Chronic pain  - Holding sedating medications on admission     DVT prophylaxis: sq heparin  Code Status: Full  Level of Care: Level of care: Telemetry Medical Family Communication: Daughter Mary Sella)  updated by phone  Disposition Plan:  Patient is from: Home  Anticipated d/c is to: TBD Anticipated d/c date is: 06/05/21 Patient currently: Pending improvement in renal function and mental status, will likely need PT eval  Consults called: Nephrology  Admission status: Inpatient     Briscoe Deutscher, MD [facility]  05/31/2021, 12:06 AM

## 2021-05-31 NOTE — ED Notes (Signed)
Patient's CBG 58; This RN gave patient 12 oz of orange juice to drink STAT. Will recheck CBG and continue to monitor.
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
## 2021-06-01 LAB — BASIC METABOLIC PANEL
Anion gap: 11 (ref 5–15)
BUN: 91 mg/dL — ABNORMAL HIGH (ref 8–23)
CO2: 15 mmol/L — ABNORMAL LOW (ref 22–32)
Calcium: 7.8 mg/dL — ABNORMAL LOW (ref 8.9–10.3)
Chloride: 116 mmol/L — ABNORMAL HIGH (ref 98–111)
Creatinine, Ser: 3.2 mg/dL — ABNORMAL HIGH (ref 0.44–1.00)
GFR, Estimated: 15 mL/min — ABNORMAL LOW (ref >60)
Glucose, Bld: 97 mg/dL (ref 70–99)
Potassium: 3.9 mmol/L (ref 3.5–5.1)
Sodium: 142 mmol/L (ref 135–145)

## 2021-06-01 LAB — CBC
HCT: 22.4 % — ABNORMAL LOW (ref 36.0–46.0)
Hemoglobin: 7.3 g/dL — ABNORMAL LOW (ref 12.0–15.0)
MCH: 26.4 pg (ref 26.0–34.0)
MCHC: 32.6 g/dL (ref 30.0–36.0)
MCV: 80.9 fL (ref 80.0–100.0)
Platelets: 236 10*3/uL (ref 150–400)
RBC: 2.77 MIL/uL — ABNORMAL LOW (ref 3.87–5.11)
RDW: 14.8 % (ref 11.5–15.5)
WBC: 4.8 10*3/uL (ref 4.0–10.5)
nRBC: 0 % (ref 0.0–0.2)

## 2021-06-01 LAB — GLUCOSE, CAPILLARY
Glucose-Capillary: 85 mg/dL (ref 70–99)
Glucose-Capillary: 85 mg/dL (ref 70–99)
Glucose-Capillary: 173 mg/dL — ABNORMAL HIGH (ref 70–99)
Glucose-Capillary: 124 mg/dL — ABNORMAL HIGH (ref 70–99)
Glucose-Capillary: 133 mg/dL — ABNORMAL HIGH (ref 70–99)

## 2021-06-01 LAB — MICROALBUMIN / CREATININE URINE RATIO
Creatinine, Urine: 55.5 mg/dL
Microalb Creat Ratio: 127 mg/g creat — ABNORMAL HIGH (ref 0–29)
Microalb, Ur: 70.3 ug/mL — ABNORMAL HIGH

## 2021-06-01 MED ORDER — STERILE WATER FOR INJECTION IV SOLN
INTRAVENOUS | Status: AC
  Filled 2021-06-01: qty 150

## 2021-06-01 MED ORDER — STERILE WATER FOR INJECTION IV SOLN: INTRAVENOUS | Status: DC

## 2021-06-01 NOTE — Plan of Care (Signed)

## 2021-06-01 NOTE — Progress Notes (Signed)
[facility] PROGRESS NOTE    Progress Note  Sabrina Rodriguez  MOQ:[id number] DOB: 03/24/1951 DOA: 05/30/2021 PCP: [name], MD     Brief Narrative:   Sabrina Rodriguez is an 70 y.o. female past medical history significant for essential hypertension, diabetes mellitus type 2 chronic back and knee pain comes into the emergency room for fatigue, confusion loss of appetite, EMS was called this patient was found hypotensive fluid resuscitated at the scene in the ED was found to be in acute kidney injury and hyperkalemia Foley catheter was placed and an output of 2 L of urine came out.   Assessment/Plan:   AKI : With a baseline creatinine of less than 1. Likely prerenal azotemia in the setting of decreased oral intake, ACE inhibitor and diuretic use. Renal ultrasound showed chronic medical disease. Creatinine is improving, continue bicarbonate for another 12 hours.  Recheck a basic metabolic panel in the morning. She is positive about 2 L. Consult PT OT.  Normal anion gap metabolic acidosis: Likely due to renal disease, improved significantly KVO IV fluids.  Metabolic encephalopathy: 1 week of confusion no neurological deficits on physical exam likely uremia.  Normocytic anemia: No signs of overt bleeding.  His hemoglobin appear to be at baseline, expect hemodilution affect after fluid hydration. Ferritin of 100 B12 greater than 7500. Hemoglobin 7.3 recheck tomorrow if symptomatic or less than 7 will transfuse.  Essential hypertension: Continue to hold antihypertensive medication blood pressure has remained stable.  Diabetes mellitus type II, non insulin dependent (HCC) With an A1c of 6.5 last year, continue sliding scale insulin monitor glucose.   DVT prophylaxis: lovenox Family Communication:none Status is: Inpatient  Remains inpatient appropriate because:Hemodynamically unstable  Dispo: The patient is from: Home              Anticipated d/c is to: Home               Patient currently is not medically stable to d/c.   Difficult to place patient No   Code Status:     Code Status Orders  (From admission, onward)           Start     Ordered   05/31/21 0003  Full code  Continuous        05/31/21 0006           Code Status History     Date Active Date Inactive Code Status Order ID Comments User Context   01/12/2020 2210 01/17/2020 1605 Full Code [id number]  Eduard Clos, MD ED   12/14/2019 0013 12/17/2019 1853 Full Code [id number]  Opyd, Lavone Neri, MD ED         IV Access:   Peripheral IV   Procedures and diagnostic studies:   US RENAL  Result Date: 05/31/2021 CLINICAL DATA:  Acute renal failure EXAM: RENAL / URINARY TRACT ULTRASOUND COMPLETE COMPARISON:  None. FINDINGS: Right Kidney: Renal measurements: 9.9 x 4.1 x 6.2 cm = volume: 141.6 mL. Echogenic renal parenchyma. No mass or hydronephrosis visualized. Trace perinephric fluid. Left Kidney: Renal measurements: 10.0 x 4.8 x 5.9 cm = volume: 148.5 mL. Echogenic renal parenchyma. No mass or hydronephrosis visualized. Trace perinephric fluid. Bladder: Decompressed by indwelling Foley catheter. Other: None. IMPRESSION: Echogenic renal parenchyma, suggesting medical renal disease. Trace perinephric fluid bilaterally.  No hydronephrosis. Bladder decompressed by an indwelling Foley catheter. Electronically Signed   By: Charline Bills M.D.   On: 05/31/2021 01:35   DG Chest Port 1 View  Result Date:  05/30/2021 CLINICAL DATA:  Weakness EXAM: PORTABLE CHEST 1 VIEW COMPARISON:  01/12/2020 FINDINGS: The heart size and mediastinal contours are within normal limits. Both lungs are clear. The visualized skeletal structures are unremarkable. IMPRESSION: No active disease. Electronically Signed   By: Deatra Robinson M.D.   On: 05/30/2021 20:30     Medical Consultants:   None.   Subjective:    Sabrina Rodriguez in no acute distress.  Objective:    Vitals:   05/31/21 1550 05/31/21 2055  06/01/21 0412 06/01/21 0458  BP: 95/61 (!) 104/53 (!) 97/52   Pulse: 87 81 78   Resp: 20 16 14    Temp:  98.8 F (37.1 C) 98 F (36.7 C)   TempSrc:   Axillary   SpO2: 95% 100% 100%   Weight:    62.6 kg  Height:       SpO2: 100 % O2 Flow Rate (L/min): 1 L/min   Intake/Output Summary (Last 24 hours) at 06/01/2021 0838 Last data filed at 06/01/2021 0730 Gross per 24 hour  Intake 1441.15 ml  Output 2925 ml  Net -1483.85 ml    Filed Weights   05/30/21 1952 06/01/21 0458  Weight: 59 kg 62.6 kg    Exam: General exam: In no acute distress. Respiratory system: Good air movement and clear to auscultation. Cardiovascular system: S1 & S2 heard, RRR. No JVD. Gastrointestinal system: Abdomen is nondistended, soft and nontender.  Extremities: No pedal edema. Skin: No rashes, lesions or ulcers Psychiatry: Judgement and insight appear normal. Mood & affect appropriate.  Data Reviewed:    Labs: Basic Metabolic Panel: Recent Labs  Lab 05/30/21 1954 05/31/21 0402 06/01/21 0105  NA 136 140 142  K 5.7* 4.9 3.9  CL 117* 123* 116*  CO2 <7* 8* 15*  GLUCOSE 96 67* 97  BUN 122* 107* 91*  CREATININE 6.18* 5.12* 3.20*  CALCIUM 8.5* 7.8* 7.8*  MG  --  1.6*  --   PHOS  --  8.3*  --     GFR Estimated Creatinine Clearance: 13.9 mL/min (A) (by C-G formula based on SCr of 3.2 mg/dL (H)). Liver Function Tests: Recent Labs  Lab 05/30/21 1954  AST 37  ALT 30  ALKPHOS 62  BILITOT 0.5  PROT 6.0*  ALBUMIN 3.3*    Recent Labs  Lab 05/30/21 1954  LIPASE 102*    Recent Labs  Lab 05/31/21 0402  AMMONIA 18    Coagulation profile Recent Labs  Lab 05/30/21 1954  INR 1.2    COVID-19 Labs  Recent Labs    05/31/21 0402  FERRITIN 100     Lab Results  Component Value Date   SARSCOV2NAA NEGATIVE 05/30/2021   SARSCOV2NAA NEGATIVE 01/12/2020   SARSCOV2NAA NEGATIVE 12/13/2019    CBC: Recent Labs  Lab 05/30/21 1954 05/31/21 0402 06/01/21 0105  WBC 7.1 5.2 4.8   NEUTROABS 5.0  --   --   HGB 8.7* 9.3* 7.3*  HCT 28.9* 31.4* 22.4*  MCV 86.5 86.5 80.9  PLT 254 215 236    Cardiac Enzymes: Recent Labs  Lab 05/31/21 1659  CKTOTAL 23*   BNP (last 3 results) No results for input(s): PROBNP in the last 8760 hours. CBG: Recent Labs  Lab 05/31/21 1216 05/31/21 1719 05/31/21 2056 06/01/21 0615 06/01/21 0723  GLUCAP 111* 141* 104* 85 85   D-Dimer: No results for input(s): DDIMER in the last 72 hours. Hgb A1c: Recent Labs    05/31/21 0402  HGBA1C 6.4*    Lipid Profile: No  results for input(s): CHOL, HDL, LDLCALC, TRIG, CHOLHDL, LDLDIRECT in the last 72 hours. Thyroid function studies: Recent Labs    05/31/21 0402  TSH 2.177    Anemia work up: Recent Labs    05/31/21 0402  VITAMINB12 >7,500*  FOLATE 78.6  FERRITIN 100  TIBC 239*  IRON 41  RETICCTPCT 2.3    Sepsis Labs: Recent Labs  Lab 05/30/21 1954 05/31/21 0402 06/01/21 0105  WBC 7.1 5.2 4.8    Microbiology Recent Results (from the past 240 hour(s))  Resp Panel by RT-PCR (Flu A&B, Covid) Nasopharyngeal Swab     Status: None   Collection Time: 05/30/21  8:00 PM   Specimen: Nasopharyngeal Swab; Nasopharyngeal(NP) swabs in vial transport medium  Result Value Ref Range Status   SARS Coronavirus 2 by RT PCR NEGATIVE NEGATIVE Final    Comment: (NOTE) SARS-CoV-2 target nucleic acids are NOT DETECTED.  The SARS-CoV-2 RNA is generally detectable in upper respiratory specimens during the acute phase of infection. The lowest concentration of SARS-CoV-2 viral copies this assay can detect is 138 copies/mL. A negative result does not preclude SARS-Cov-2 infection and should not be used as the sole basis for treatment or other patient management decisions. A negative result may occur with  improper specimen collection/handling, submission of specimen other than nasopharyngeal swab, presence of viral mutation(s) within the areas targeted by this assay, and inadequate  number of viral copies(<138 copies/mL). A negative result must be combined with clinical observations, patient history, and epidemiological information. The expected result is Negative.  Fact Sheet for Patients:  BloggerCourse.com  Fact Sheet for Healthcare Providers:  SeriousBroker.it  This test is no t yet approved or cleared by the [place] FDA and  has been authorized for detection and/or diagnosis of SARS-CoV-2 by FDA under an Emergency Use Authorization (EUA). This EUA will remain  in effect (meaning this test can be used) for the duration of the COVID-19 declaration under Section 564(b)(1) of the Act, 21 U.S.C.section 360bbb-3(b)(1), unless the authorization is terminated  or revoked sooner.       Influenza A by PCR NEGATIVE NEGATIVE Final   Influenza B by PCR NEGATIVE NEGATIVE Final    Comment: (NOTE) The Xpert Xpress SARS-CoV-2/FLU/RSV plus assay is intended as an aid in the diagnosis of influenza from Nasopharyngeal swab specimens and should not be used as a sole basis for treatment. Nasal washings and aspirates are unacceptable for Xpert Xpress SARS-CoV-2/FLU/RSV testing.  Fact Sheet for Patients: BloggerCourse.com  Fact Sheet for Healthcare Providers: SeriousBroker.it  This test is not yet approved or cleared by the [place] FDA and has been authorized for detection and/or diagnosis of SARS-CoV-2 by FDA under an Emergency Use Authorization (EUA). This EUA will remain in effect (meaning this test can be used) for the duration of the COVID-19 declaration under Section 564(b)(1) of the Act, 21 U.S.C. section 360bbb-3(b)(1), unless the authorization is terminated or revoked.  Performed at [facility], [address] [place], [place] [address]      Medications:    heparin  5,000 Units Subcutaneous Q8H   insulin aspart  0-6 Units Subcutaneous  TID WC   Continuous Infusions:      LOS: 2 days   Marinda Elk  [facility]  06/01/2021, 8:38 AM

## 2021-06-02 ENCOUNTER — Inpatient Hospital Stay (HOSPITAL_COMMUNITY): Payer: Medicare Other

## 2021-06-02 DIAGNOSIS — R112 Nausea with vomiting, unspecified: Secondary | ICD-10-CM

## 2021-06-02 LAB — BASIC METABOLIC PANEL
Anion gap: 8 (ref 5–15)
BUN: 58 mg/dL — ABNORMAL HIGH (ref 8–23)
CO2: 23 mmol/L (ref 22–32)
Calcium: 7.8 mg/dL — ABNORMAL LOW (ref 8.9–10.3)
Chloride: 112 mmol/L — ABNORMAL HIGH (ref 98–111)
Creatinine, Ser: 1.9 mg/dL — ABNORMAL HIGH (ref 0.44–1.00)
GFR, Estimated: 28 mL/min — ABNORMAL LOW (ref >60)
Glucose, Bld: 99 mg/dL (ref 70–99)
Potassium: 3.7 mmol/L (ref 3.5–5.1)
Sodium: 143 mmol/L (ref 135–145)

## 2021-06-02 LAB — CBC
HCT: 20.3 % — ABNORMAL LOW (ref 36.0–46.0)
HCT: 27.4 % — ABNORMAL LOW (ref 36.0–46.0)
Hemoglobin: 6.5 g/dL — CL (ref 12.0–15.0)
Hemoglobin: 8.8 g/dL — ABNORMAL LOW (ref 12.0–15.0)
MCH: 25.9 pg — ABNORMAL LOW (ref 26.0–34.0)
MCH: 26.6 pg (ref 26.0–34.0)
MCHC: 32 g/dL (ref 30.0–36.0)
MCHC: 32.1 g/dL (ref 30.0–36.0)
MCV: 80.9 fL (ref 80.0–100.0)
MCV: 82.8 fL (ref 80.0–100.0)
Platelets: 211 10*3/uL (ref 150–400)
Platelets: 217 10*3/uL (ref 150–400)
RBC: 2.51 MIL/uL — ABNORMAL LOW (ref 3.87–5.11)
RBC: 3.31 MIL/uL — ABNORMAL LOW (ref 3.87–5.11)
RDW: 14.6 % (ref 11.5–15.5)
RDW: 14.5 % (ref 11.5–15.5)
WBC: 4.4 10*3/uL (ref 4.0–10.5)
WBC: 4.4 10*3/uL (ref 4.0–10.5)
nRBC: 0 % (ref 0.0–0.2)
nRBC: 0 % (ref 0.0–0.2)

## 2021-06-02 LAB — COMPREHENSIVE METABOLIC PANEL
ALT: 33 U/L (ref 0–44)
AST: 39 U/L (ref 15–41)
Albumin: 2.9 g/dL — ABNORMAL LOW (ref 3.5–5.0)
Alkaline Phosphatase: 54 U/L (ref 38–126)
Anion gap: 10 (ref 5–15)
BUN: 48 mg/dL — ABNORMAL HIGH (ref 8–23)
CO2: 22 mmol/L (ref 22–32)
Calcium: 8.2 mg/dL — ABNORMAL LOW (ref 8.9–10.3)
Chloride: 116 mmol/L — ABNORMAL HIGH (ref 98–111)
Creatinine, Ser: 1.84 mg/dL — ABNORMAL HIGH (ref 0.44–1.00)
GFR, Estimated: 30 mL/min — ABNORMAL LOW (ref >60)
Glucose, Bld: 139 mg/dL — ABNORMAL HIGH (ref 70–99)
Potassium: 4.1 mmol/L (ref 3.5–5.1)
Sodium: 148 mmol/L — ABNORMAL HIGH (ref 135–145)
Total Bilirubin: 1.6 mg/dL — ABNORMAL HIGH (ref 0.3–1.2)
Total Protein: 5.5 g/dL — ABNORMAL LOW (ref 6.5–8.1)

## 2021-06-02 LAB — GLUCOSE, CAPILLARY
Glucose-Capillary: 138 mg/dL — ABNORMAL HIGH (ref 70–99)
Glucose-Capillary: 138 mg/dL — ABNORMAL HIGH (ref 70–99)
Glucose-Capillary: 132 mg/dL — ABNORMAL HIGH (ref 70–99)
Glucose-Capillary: 129 mg/dL — ABNORMAL HIGH (ref 70–99)

## 2021-06-02 LAB — LIPASE, BLOOD: Lipase: 33 U/L (ref 11–51)

## 2021-06-02 LAB — PREPARE RBC (CROSSMATCH)

## 2021-06-02 IMAGING — CT CT HEAD W/O CM
4 series · 16 of 47 positions shown, 18 images · non-contrast
Comparison: [DATE]

CLINICAL DATA: Delirium.  Weakness.

EXAM:
CT HEAD WITHOUT CONTRAST
TECHNIQUE: Contiguous axial images were obtained from the base of the skull
through the vertex without intravenous contrast.

[Series 3: head without · axial · non-contrast · 0.43mm/px · z∈[+120,+235]mm · 7 of 31 slices shown, 9 images]
[im 4/31  brain]
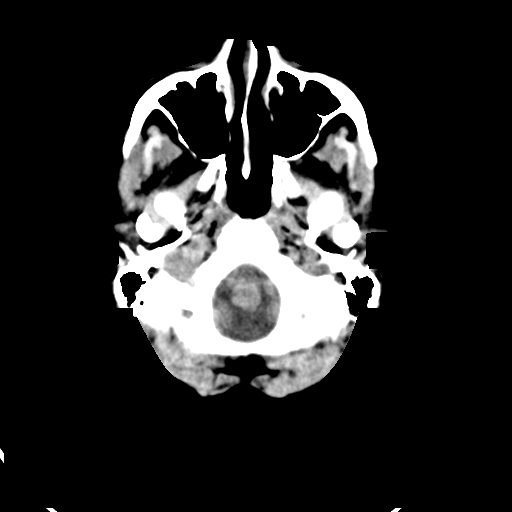
[im 4/31  bone]
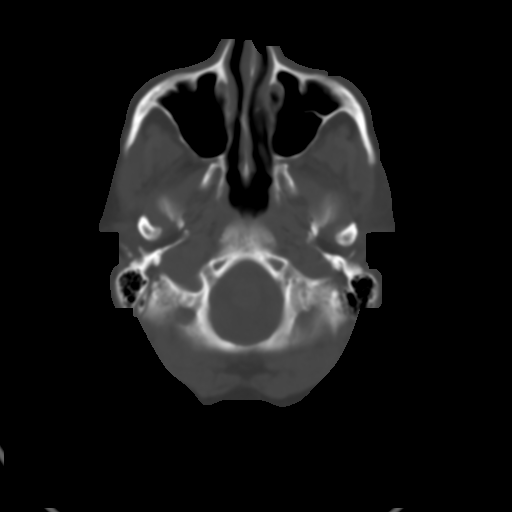
[im 8/31  brain]
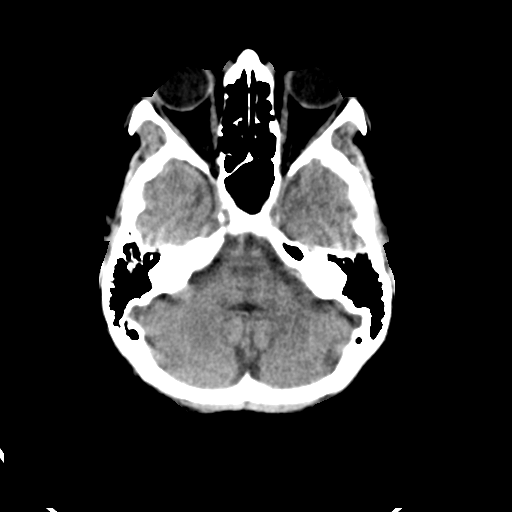
[im 12/31  brain]
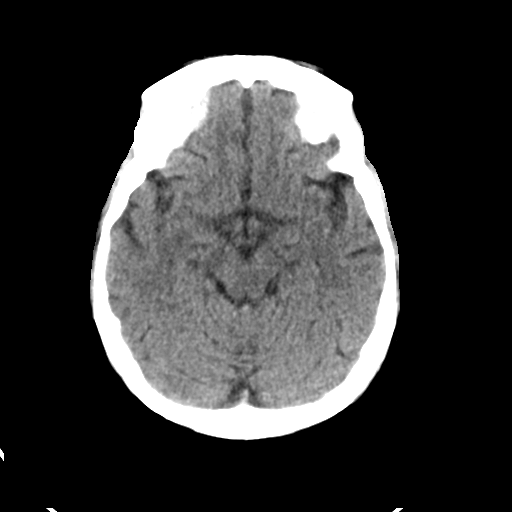
[im 16/31  brain]
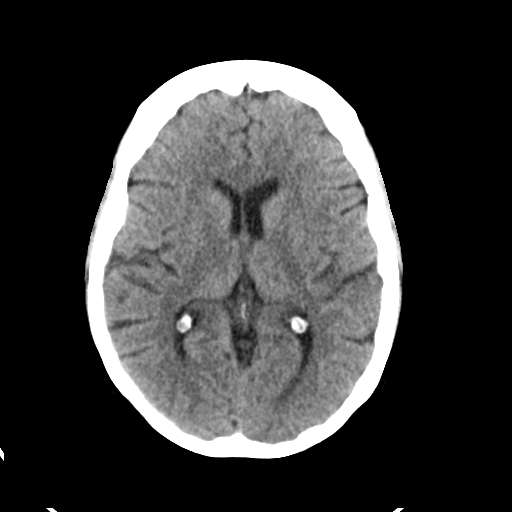
[im 19/31  brain]
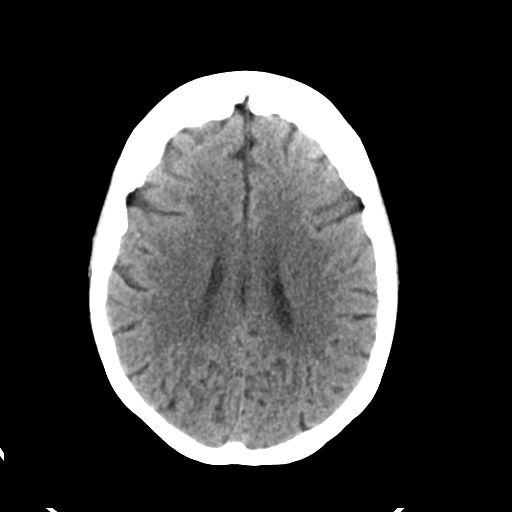
[im 19/31  bone]
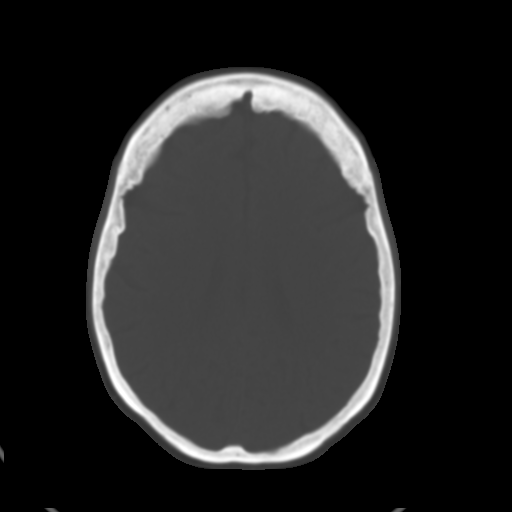
[im 23/31  brain]
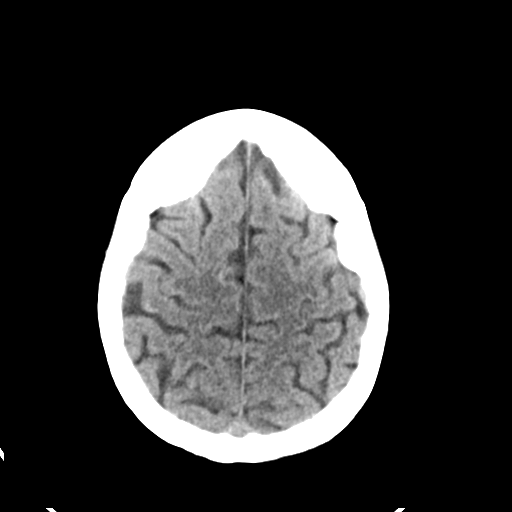
[im 27/31  brain]
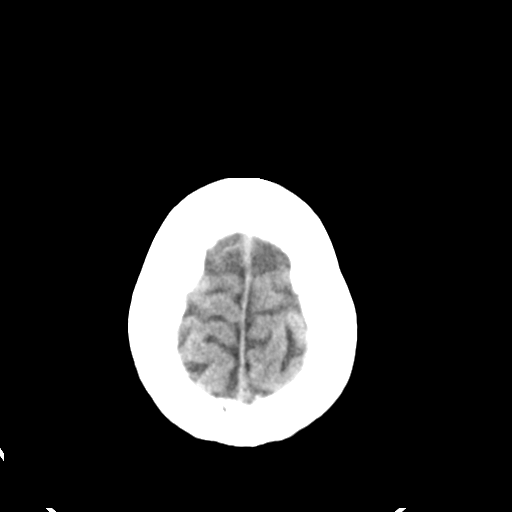

[Series 4: head bone · axial · 0.43mm/px · z∈[+119,+149]mm · 3 of 76 slices shown]
[im 8/76  bone]
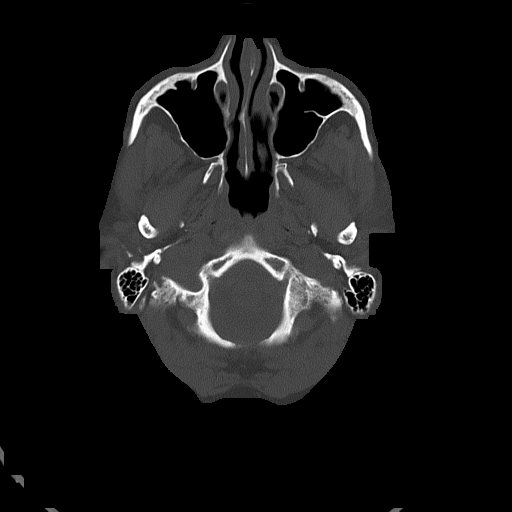
[im 16/76  bone]
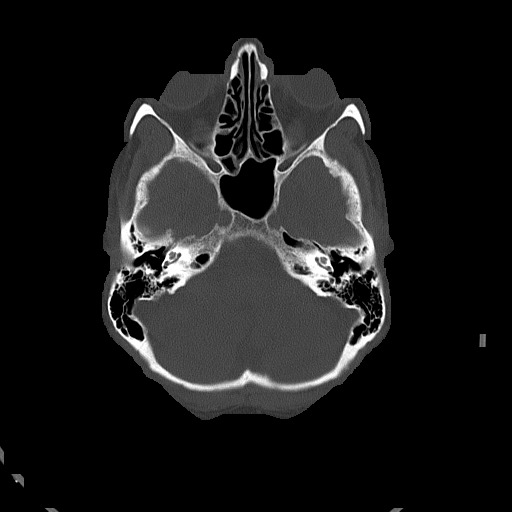
[im 23/76  bone]
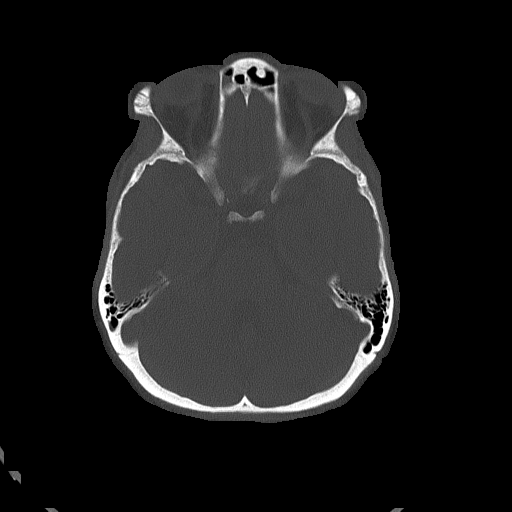

[Series 5: head without cor · coronal · non-contrast · 0.31mm/px · 3 of 69 slices shown]
[im 23/69  brain]
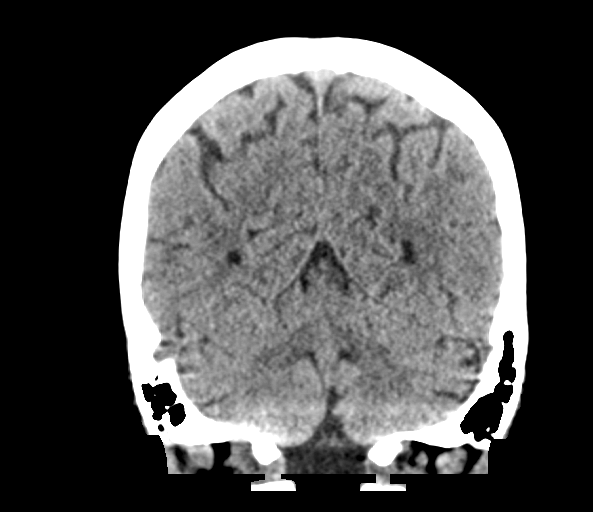
[im 31/69  brain]
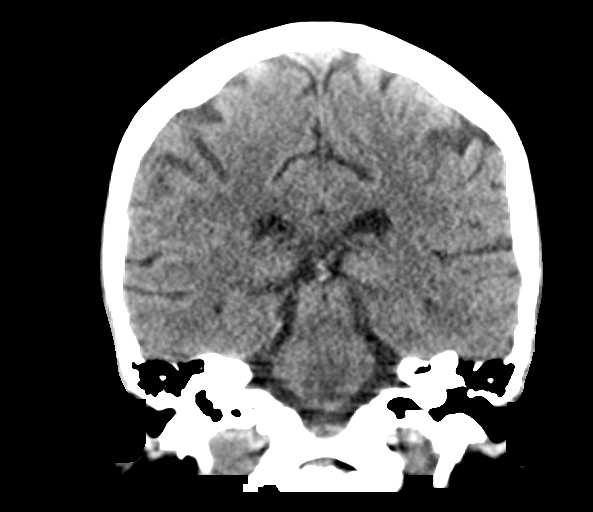
[im 38/69  brain]
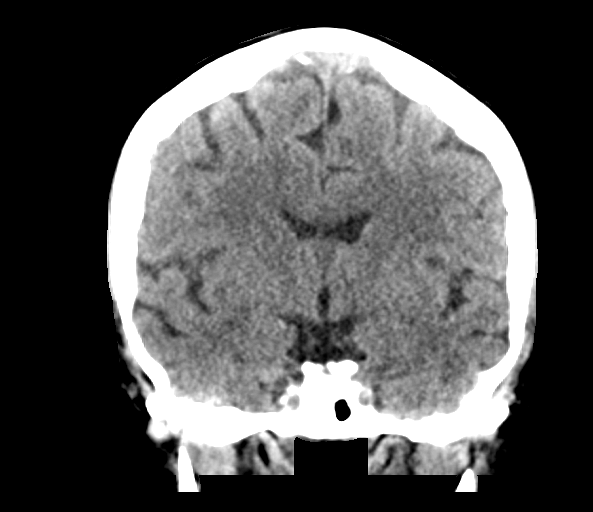

[Series 6: head without sag · sagittal · non-contrast · 0.31mm/px · 3 of 61 slices shown]
[im 21/61  brain]
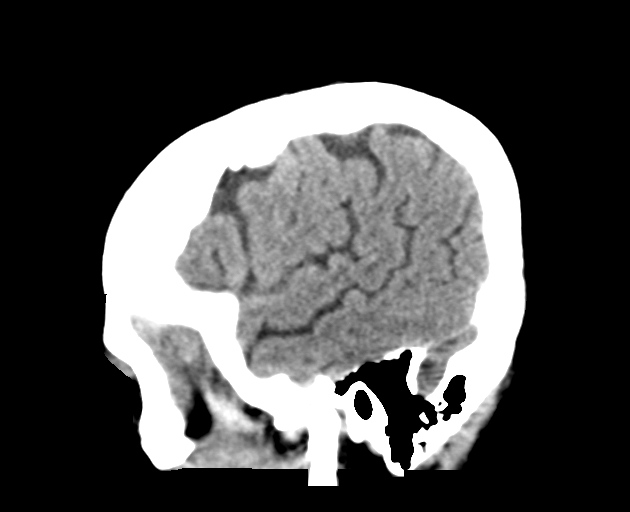
[im 31/61  brain]
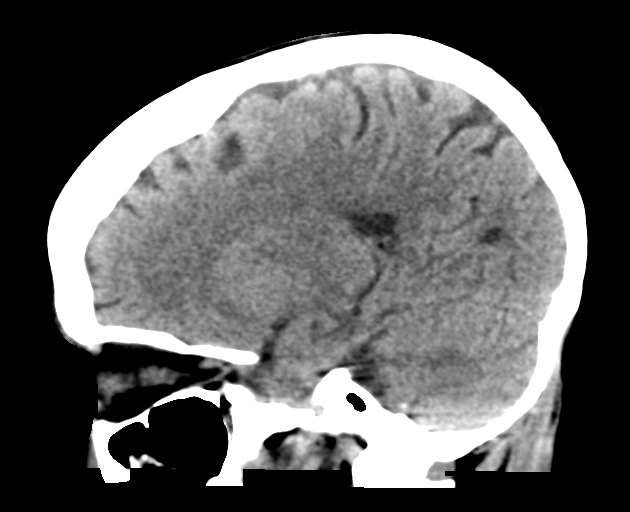
[im 41/61  brain]
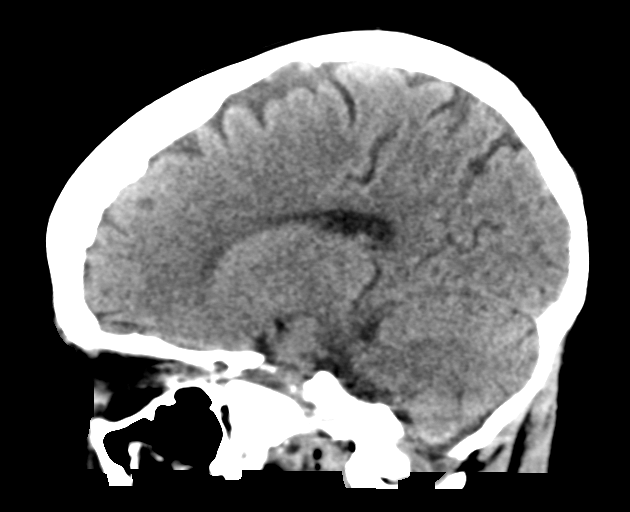

[16 of 47 positions shown; findings below may reference images not displayed]

FINDINGS: Brain: There is no evidence of an acute infarct, intracranial
hemorrhage, mass, midline shift, or extra-axial fluid collection.
The ventricles and sulci are normal.

Vascular: Calcified atherosclerosis at the skull base. No hyperdense
vessel.

Skull: No fracture or suspicious osseous lesion.

Sinuses/Orbits: Visualized paranasal sinuses and mastoid air cells
are clear aside from a subcentimeter osteoma within a left ethmoid
air cell. Bilateral cataract extraction.

Other: None.
IMPRESSION: Unremarkable CT appearance of the brain.

## 2021-06-02 MED ORDER — ONDANSETRON HCL 4 MG PO TABS
4.0000 mg | ORAL_TABLET | ORAL | Status: DC
  Administered 2021-06-03: 4 mg via ORAL
  Filled 2021-06-02 (×3): qty 1

## 2021-06-02 MED ORDER — ONDANSETRON HCL 4 MG/2ML IJ SOLN
4.0000 mg | INTRAMUSCULAR | Status: DC
  Administered 2021-06-02 – 2021-06-03 (×8): 4 mg via INTRAVENOUS
  Filled 2021-06-02 (×8): qty 2

## 2021-06-02 MED ORDER — POTASSIUM CHLORIDE IN NACL 20-0.9 MEQ/L-% IV SOLN
INTRAVENOUS | Status: DC
  Filled 2021-06-02: qty 1000

## 2021-06-02 MED ORDER — DULOXETINE HCL 60 MG PO CPEP: 90.0000 mg | ORAL_CAPSULE | Freq: Every day | ORAL | Status: DC

## 2021-06-02 MED ORDER — SODIUM CHLORIDE 0.9% IV SOLUTION: Freq: Once | INTRAVENOUS | Status: AC

## 2021-06-02 MED ORDER — SODIUM CHLORIDE 0.45 % IV SOLN: INTRAVENOUS | Status: DC
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
## 2021-06-02 NOTE — Plan of Care (Signed)
  Problem: Education: Goal: Knowledge of General Education information will improve Description Including pain rating scale, medication(s)/side effects and non-pharmacologic comfort measures Outcome: Progressing   Problem: Health Behavior/Discharge Planning: Goal: Ability to manage health-related needs will improve Outcome: Progressing   

## 2021-06-02 NOTE — Plan of Care (Signed)

## 2021-06-02 NOTE — Plan of Care (Signed)
  Problem: Activity: Goal: Risk for activity intolerance will decrease Outcome: Progressing   Problem: Nutrition: Goal: Adequate nutrition will be maintained Outcome: Progressing   Problem: Coping: Goal: Level of anxiety will decrease Outcome: Progressing   Problem: Elimination: Goal: Will not experience complications related to bowel motility Outcome: Progressing   

## 2021-06-02 NOTE — Progress Notes (Signed)
Got a call from about patient's hgb level was 6.5. On call privider was notified

## 2021-06-03 ENCOUNTER — Inpatient Hospital Stay (HOSPITAL_COMMUNITY): Payer: Medicare Other

## 2021-06-03 DIAGNOSIS — R001 Bradycardia, unspecified: Secondary | ICD-10-CM

## 2021-06-03 DIAGNOSIS — R9431 Abnormal electrocardiogram [ECG] [EKG]: Secondary | ICD-10-CM

## 2021-06-03 LAB — BASIC METABOLIC PANEL
Anion gap: 10 (ref 5–15)
BUN: 44 mg/dL — ABNORMAL HIGH (ref 8–23)
CO2: 21 mmol/L — ABNORMAL LOW (ref 22–32)
Calcium: 8 mg/dL — ABNORMAL LOW (ref 8.9–10.3)
Chloride: 114 mmol/L — ABNORMAL HIGH (ref 98–111)
Creatinine, Ser: 1.72 mg/dL — ABNORMAL HIGH (ref 0.44–1.00)
GFR, Estimated: 32 mL/min — ABNORMAL LOW (ref >60)
Glucose, Bld: 95 mg/dL (ref 70–99)
Potassium: 3.9 mmol/L (ref 3.5–5.1)
Sodium: 145 mmol/L (ref 135–145)

## 2021-06-03 LAB — TROPONIN I (HIGH SENSITIVITY)
Troponin I (High Sensitivity): 112 ng/L (ref <18)
Troponin I (High Sensitivity): 116 ng/L (ref <18)
Troponin I (High Sensitivity): 99 ng/L — ABNORMAL HIGH (ref <18)

## 2021-06-03 LAB — CBC
HCT: 26.2 % — ABNORMAL LOW (ref 36.0–46.0)
Hemoglobin: 8.4 g/dL — ABNORMAL LOW (ref 12.0–15.0)
MCH: 27 pg (ref 26.0–34.0)
MCHC: 32.1 g/dL (ref 30.0–36.0)
MCV: 84.2 fL (ref 80.0–100.0)
Platelets: 191 10*3/uL (ref 150–400)
RBC: 3.11 MIL/uL — ABNORMAL LOW (ref 3.87–5.11)
RDW: 14.7 % (ref 11.5–15.5)
WBC: 7.1 10*3/uL (ref 4.0–10.5)
nRBC: 0 % (ref 0.0–0.2)

## 2021-06-03 LAB — ECHOCARDIOGRAM COMPLETE
Area-P 1/2: 2.52 cm2
Height: 63 in
S' Lateral: 2.6 cm
Weight: 2208.13 oz

## 2021-06-03 LAB — TYPE AND SCREEN
ABO/RH(D): O NEG
Antibody Screen: NEGATIVE
Unit division: 0

## 2021-06-03 LAB — BPAM RBC
Blood Product Expiration Date: 202207112359
ISSUE DATE / TIME: 202207060543
Unit Type and Rh: 9500

## 2021-06-03 LAB — GLUCOSE, CAPILLARY
Glucose-Capillary: 120 mg/dL — ABNORMAL HIGH (ref 70–99)
Glucose-Capillary: 102 mg/dL — ABNORMAL HIGH (ref 70–99)
Glucose-Capillary: 105 mg/dL — ABNORMAL HIGH (ref 70–99)
Glucose-Capillary: 136 mg/dL — ABNORMAL HIGH (ref 70–99)

## 2021-06-03 LAB — TSH: TSH: 1.27 u[IU]/mL (ref 0.350–4.500)

## 2021-06-03 LAB — MAGNESIUM: Magnesium: 1.4 mg/dL — ABNORMAL LOW (ref 1.7–2.4)

## 2021-06-03 MED ORDER — MAGNESIUM SULFATE 2 GM/50ML IV SOLN
2.0000 g | Freq: Once | INTRAVENOUS | Status: AC
  Administered 2021-06-03: 2 g via INTRAVENOUS
  Filled 2021-06-03: qty 50

## 2021-06-03 MED ORDER — DULOXETINE HCL 30 MG PO CPEP
30.0000 mg | ORAL_CAPSULE | Freq: Every day | ORAL | Status: DC
  Administered 2021-06-04: 30 mg via ORAL
  Filled 2021-06-03: qty 1

## 2021-06-03 NOTE — Plan of Care (Signed)

## 2021-06-03 NOTE — Progress Notes (Signed)
PT started bradying down to mid 40's. On call MD was notified, new orders noted. See mar for details.

## 2021-06-03 NOTE — Evaluation (Signed)
Physical Therapy Evaluation Patient Details Name: Sabrina Rodriguez MRN: [id number] DOB: May 12, 1951 Today's Date: 06/03/2021   History of Present Illness  70 y.o. female presenting to the emergency department 05/30/21 for evaluation of fatigue, confusion, and loss of appetite. +acute renal failure with hyperkalemia; encephalopathy;  PMH significant for hypertension, type 2 diabetes mellitus, and chronic back and knee pain; pt reports several week h/o falls with and without warning (sometimes has severe "shakes" and then falls, sometimes no warning) reports +LOC when falls  Clinical Impression   Pt admitted secondary to problem above with deficits below. PTA patient was frequently falling (sometimes after shaking spells and sometimes without warning). She reports she loses consciousness briefly. Very fearful of repeat falls "because I hit hard... I just drop."  Pt currently requires minguard assist and education in use of RW. Discussed unclear etiology of falls and encouraged to discuss with her doctor. Patient lives alone but has sister that provides support.  Anticipate patient may benefit from PT to address problems listed below.Will continue to follow acutely to maximize functional mobility independence and safety.       Follow Up Recommendations Home health PT;Supervision for mobility/OOB (pt adamantly refuses to consider SNF)    Equipment Recommendations  Rolling walker with 5" wheels    Recommendations for Other Services OT consult     Precautions / Restrictions Precautions Precautions: Fall Precaution Comments: many falls without warning      Mobility  Bed Mobility Overal bed mobility: Modified Independent             General bed mobility comments: incr time and slight incr effort    Transfers Overall transfer level: Needs assistance Equipment used: None Transfers: Sit to/from Stand Sit to Stand: Min guard         General transfer comment: pt reaching for UE support  and then provided RW for her to hold onto;  Ambulation/Gait Ambulation/Gait assistance: Min guard Gait Distance (Feet): 40 Feet Assistive device: Rolling walker (2 wheeled) Gait Pattern/deviations: Step-through pattern;Decreased stride length     General Gait Details: very cautious with fear of falling; no imbalance or shaking noted  Stairs            Wheelchair Mobility    Modified Rankin (Stroke Patients Only)       Balance Overall balance assessment: Needs assistance   Sitting balance-Leahy Scale: Good     Standing balance support: Bilateral upper extremity supported Standing balance-Leahy Scale: Poor Standing balance comment: needs external support via RW                             Pertinent Vitals/Pain Pain Assessment: No/denies pain    Home Living Family/patient expects to be discharged to:: Private residence Living Arrangements: Alone Available Help at Discharge: Family;Available PRN/intermittently Type of Home: Apartment Home Access: Level entry     Home Layout: One level Home Equipment: Toilet riser (toilet riser with handles)      Prior Function Level of Independence: Independent         Comments: had recently (weeks) begun having shaking spells and then falls; reports ALOT of falls     Hand Dominance        Extremity/Trunk Assessment   Upper Extremity Assessment Upper Extremity Assessment: Defer to OT evaluation    Lower Extremity Assessment Lower Extremity Assessment: Generalized weakness    Cervical / Trunk Assessment Cervical / Trunk Assessment: Normal  Communication   Communication: [facility]  Cognition Arousal/Alertness: Awake/alert Behavior During Therapy: WFL for tasks assessed/performed Overall Cognitive Status: Within Functional Limits for tasks assessed                                 General Comments: cognition not specifically assessed      General Comments General comments (skin  integrity, edema, etc.): HR 69-74 regular rhythm    Exercises     Assessment/Plan    PT Assessment Patient needs continued PT services  PT Problem List Decreased strength;Decreased activity tolerance;Decreased balance;Decreased mobility;Decreased knowledge of use of DME;Impaired sensation       PT Treatment Interventions DME instruction;Gait training;Functional mobility training;Therapeutic activities;Therapeutic exercise;Balance training;Patient/family education    PT Goals (Current goals can be found in the Care Plan section)  Acute Rehab PT Goals Patient Stated Goal: find out why she is falling PT Goal Formulation: With patient Time For Goal Achievement: 06/17/21 Potential to Achieve Goals: Good    Frequency Min 3X/week   Barriers to discharge Decreased caregiver support lives alone    Co-evaluation               AM-PAC PT "6 Clicks" Mobility  Outcome Measure Help needed turning from your back to your side while in a flat bed without using bedrails?: None Help needed moving from lying on your back to sitting on the side of a flat bed without using bedrails?: None Help needed moving to and from a bed to a chair (including a wheelchair)?: A Little Help needed standing up from a chair using your arms (e.g., wheelchair or bedside chair)?: A Little Help needed to walk in hospital room?: A Little Help needed climbing 3-5 steps with a railing? : A Little 6 Click Score: 20    End of Session Equipment Utilized During Treatment: Gait belt Activity Tolerance: Patient tolerated treatment well Patient left: in chair;with call bell/phone within reach;with chair alarm set Nurse Communication: Mobility status;Other (comment) (h/o many falls) PT Visit Diagnosis: Repeated falls (R29.6);Muscle weakness (generalized) (M62.81)    Time: 9798-9211 PT Time Calculation (min) (ACUTE ONLY): 29 min   Charges:   PT Evaluation $PT Eval Moderate Complexity: 1 Mod PT Treatments $Gait  Training: 8-22 mins         Jerolyn Center, PT Pager [phone]   Zena Amos 06/03/2021, 9:47 AM

## 2021-06-03 NOTE — Progress Notes (Signed)
  Echocardiogram 2D Echocardiogram has been performed.  Delcie Roch 06/03/2021, 3:35 PM

## 2021-06-03 NOTE — Consult Note (Addendum)
Cardiology Consultation:   Patient ID: Sabrina Rodriguez MRN: [id number]; DOB: 12-22-1950  Admit date: 05/30/2021 Date of Consult: 06/03/2021  PCP:  Carol Ada, MD   [facility] Providers Cardiologist:  Larae Grooms, MD   {  Patient Profile:   Sabrina Rodriguez is a 70 y.o. female with a history of hypertension, type 2 diabetes, GERD, anemia, and chronic back and knee pain who is being seen for the evaluation of bradycardia at the request of Dr. Wynelle Cleveland.  History of Present Illness:   [name] is a 70 year old female with the above history. No known cardiac history. She has a history of hypertension and diabetes. No smoking history. She drinks socially. She does have a family history of heart disease with mother and father having CAD (father had bypass surgery).  Patient presented to the ED on 05/30/2021 via EMS for further evaluation of weakness and altered mental status. Upon arrival of EMS, BP 88/40. She was given 500cc of IV fluids en route to the ED, Upon arrival to the ED, systolic BP in the low 242P. EKG showed normal sinus rhythm, rate 81 bpm, with no acute ST/T changes. High-sensitivity troponin negative x2. Chest x-ray showed no acute ischemic changes. WBC 7.1, Hgb 8.7, Plts 254. Na 136, K 5.7, Glucose 96, BUN 122, Cr 6.18. Albumin 3.3, AST 37, ALT 30, Alk Phos 62, Total Bili 0.5. Lipase elevated at 102. Respiratory panel negative for COVID and influenza. She was given additional IV fluids in the ED and was admitted with AKI renal failure with uremia. Renal ultrasound suggestive of medical renal disease but no hydronephrosis.   Cardiology consulted today for further evaluation of nocturnal bradycardia. Heart rates dropped to the 40s around 3-4am. At the time of this evaluation, patient resting comfortably in no acute distress. Patient is a difficult historian. Sister is a bedside and assists with history. Patient states she was in her usual state of health about 6 months ago. She was able  to all normal activities without any problems. However, the last 4 months or so, she has developed significant weakness and tremor of bilateral upper extremities. She is very concerned about this. However, she has also been having palpitations where she feels like her heart is "fluttering" with associated "labored breathing," chest pain, and dizziness. During these, episodes, patient states she often leans forwards and feels like she is going to pass out but doesn't. Patient denies any syncope. Sister states she has been having very frequent falls (multiple times per week) and sister is unsure whether she is passing out with these events because patient told her that when she is on the ground after the falls she is not really sure what happened. Patient denies any chest pain or shortness of breath outside of the episodes of palpitations. She does not some lower extremity edema. She denies any fever. Sounds like she may be having a productive cough where she is coughing up green phlegm. No abnormal bleeding in stools. Patient has had multiple episodes of vomiting here in the hospital. She also admits that she has had a poor appetite over the last few months but states she has been drinking a lot of fluids. Sister also states she has been very confused lately. Patient has never been diagnosed with dementia; however, her father does have a history of "early dementia" and was reportedly diagnosed with this in his 40s.  Per sister, PCP has referred patient to Cardiology for an Echo. It sounds like this was likely  due to frequent complaints of palpitations and shortness of breath. It also sounds like Neurology referral has been discussed but unsure if this has actually been done.      Past Medical History:  Diagnosis Date   Acid reflux    Diabetes mellitus without complication ([place])    pre diabetes   Diverticulitis    Hypertension     History reviewed. No pertinent surgical history.   Home Medications:   Prior to Admission medications   Medication Sig Start Date End Date Taking? Authorizing Provider  ALPRAZolam (XANAX) 0.25 MG tablet Take 0.25 mg by mouth 2 (two) times daily as needed for anxiety or sleep.   Yes [provider]  amLODipine (NORVASC) 2.5 MG tablet Take 2.5 mg by mouth daily. 12/03/19  Yes [provider]  DULoxetine (CYMBALTA) 30 MG capsule Take 90 mg by mouth daily. 08/19/19  Yes [provider]  feeding supplement, ENSURE ENLIVE, (ENSURE ENLIVE) LIQD Take 237 mLs by mouth 3 (three) times daily between meals. 01/17/20  Yes Mikhail, Maryann, DO  furosemide (LASIX) 20 MG tablet Take 20 mg by mouth daily. 03/28/21  Yes [provider]  gabapentin (NEURONTIN) 100 MG capsule Take 100 mg by mouth every 8 (eight) hours. 05/21/21  Yes [provider]  Iron, Ferrous Sulfate, 325 (65 Fe) MG TABS Take 325 mg by mouth 2 (two) times daily. 12/17/19  Yes Georgette Shell, MD  lisinopril (ZESTRIL) 20 MG tablet Take 20 mg by mouth daily. 11/04/19  Yes [provider]  metFORMIN (GLUCOPHAGE) 500 MG tablet Take 500 mg by mouth at bedtime. 10/07/19  Yes [provider]  Multiple Vitamin (MULTIVITAMIN WITH MINERALS) TABS tablet Take 1 tablet by mouth daily. 01/17/20  Yes Mikhail, Velta Addison, DO  omeprazole (PRILOSEC) 40 MG capsule Take 40 mg by mouth daily. 10/07/19  Yes [provider]  ondansetron (ZOFRAN) 8 MG tablet Take 8 mg by mouth 3 (three) times daily. 01/06/20  Yes [provider]  psyllium (HYDROCIL/METAMUCIL) 95 % PACK Take 1 packet by mouth 2 (two) times daily. 12/17/19  Yes Georgette Shell, MD  rosuvastatin (CRESTOR) 20 MG tablet Take 20 mg by mouth daily. 10/08/19  Yes [provider]  tiZANidine (ZANAFLEX) 4 MG tablet Take 4 mg by mouth every 8 (eight) hours as needed for muscle spasms.  11/25/19  Yes [provider]    Inpatient Medications: Scheduled Meds:  [START ON 06/04/2021] DULoxetine   30 mg Oral Daily   heparin  5,000 Units Subcutaneous Q8H   insulin aspart  0-6 Units Subcutaneous TID WC   ondansetron  4 mg Oral Q4H   Or   ondansetron (ZOFRAN) IV  4 mg Intravenous Q4H   Continuous Infusions:  sodium chloride 75 mL/hr at 06/02/21 1945   PRN Meds: acetaminophen **OR** acetaminophen  Allergies:    Allergies  Allergen Reactions   Chlorhexidine     Social History:   Social History   Socioeconomic History   Marital status: Single    Spouse name: Not on file   Number of children: Not on file   Years of education: Not on file   Highest education level: Not on file  Occupational History   Not on file  Tobacco Use   Smoking status: Never   Smokeless tobacco: Never  Substance and Sexual Activity   Alcohol use: Yes    Comment: occasional glass of wine   Drug use: Not on file   Sexual activity: Not on file  Other Topics Concern   Not on file  Social History Narrative   Not on file   Social Determinants of Health   Financial Resource Strain: Not on file  Food Insecurity: Not on file  Transportation Needs: Not on file  Physical Activity: Not on file  Stress: Not on file  Social Connections: Not on file  Intimate Partner Violence: Not on file    Family History:   Family History  Problem Relation Age of Onset   Lung disease Mother    Hypertension Father    Hyperlipidemia Father    Bowel Disease Sister    Breast cancer Sister    Thyroid cancer Sister      ROS:  Please see the history of present illness.  All other ROS reviewed and negative.     Physical Exam/Data:   Vitals:   06/02/21 0923 06/02/21 1328 06/03/21 0249 06/03/21 0719  BP: (!) 118/55 (!) 118/56 (!) 122/56   Pulse: 80 (!) 51 (!) 46   Resp: 16 16 16    Temp: 98.9 F (37.2 C) 98.7 F (37.1 C) 98.8 F (37.1 C)   TempSrc:  Oral Oral   SpO2: 97%  97% 96%  Weight:      Height:        Intake/Output Summary (Last 24 hours) at 06/03/2021 1318 Last data filed at 06/03/2021  0600 Gross per 24 hour  Intake 1092.63 ml  Output 800 ml  Net 292.63 ml   Last 3 Weights 06/01/2021 05/30/2021 01/13/2020  Weight (lbs) 138 lb 0.1 oz 130 lb 106 lb 1.6 oz  Weight (kg) 62.6 kg 58.968 kg 48.127 kg     Body mass index is 24.45 kg/m.    Physical Exam per MD:  General: 70 y.o. female resting comfortably in no acute distress. HEENT: Normocephalic and atraumatic. Sclera clear.  Neck: Supple. No JVD. Heart: RRR. Distinct S1 and S2. No murmurs, gallops, or rubs.  Lungs: No increased work of breathing. Clear to ausculation bilaterally. No wheezes, rhonchi, or rales.  Abdomen: Soft, non-distended, and non-tender to palpation. Bowel sounds present in all 4 quadrants.  MSK: Normal strength and tone for age. Extremities: No lower extremity edema.    Skin: Warm and dry. Neuro: Alert and oriented x3. No focal deficits. Psych: Normal affect. Responds appropriately.  EKG:  The EKG was personally reviewed and demonstrates:  - EKG on 05/30/2021: Normal sinus rhythm, rate 81 bpm, with peaked T waves in anterior leads but no acute ischemic changes.  - EKG on 06/03/2021 (not scanned into system): Sinus bradycardia, rate 47 bpm, with no acute ischemic changes. - EKG on 06/03/2021 at 3:50am (scanned into system): Sinus rhythm, rate 60 bpm, with bigeminy PACs.   Telemetry:  Telemetry was personally reviewed and demonstrates:  Sinus rhythm with rates in the 50s to 70s. Multiple PVCs/PACs, sometimes in bigeminy rhythm. Few short runs of SVT/PAT also noted with rates as high as the 150s. A few possible junctional escape beats/rhythm noted with rates in the 50s.  Relevant CV Studies:  None  Laboratory Data:  High Sensitivity Troponin:   Recent Labs  Lab 05/30/21 1954 05/30/21 2314 06/03/21 0402 06/03/21 0629 06/03/21 0753  TROPONINIHS 11 13 112* 116* 99*     Chemistry Recent Labs  Lab 06/02/21 0155 06/02/21 1712 06/03/21 0258  NA 143 148* 145  K 3.7 4.1 3.9  CL 112* 116* 114*  CO2  23 22 21*  GLUCOSE 99 139* 95  BUN 58* 48* 44*  CREATININE  1.90* 1.84* 1.72*  CALCIUM 7.8* 8.2* 8.0*  GFRNONAA 28* 30* 32*  ANIONGAP 8 10 10     Recent Labs  Lab 05/30/21 1954 06/02/21 1712  PROT 6.0* 5.5*  ALBUMIN 3.3* 2.9*  AST 37 39  ALT 30 33  ALKPHOS 62 54  BILITOT 0.5 1.6*   Hematology Recent Labs  Lab 06/02/21 0155 06/02/21 1712 06/03/21 0258  WBC 4.4 4.4 7.1  RBC 2.51* 3.31* 3.11*  HGB 6.5* 8.8* 8.4*  HCT 20.3* 27.4* 26.2*  MCV 80.9 82.8 84.2  MCH 25.9* 26.6 27.0  MCHC 32.0 32.1 32.1  RDW 14.6 14.5 14.7  PLT 211 217 191   BNPNo results for input(s): BNP, PROBNP in the last 168 hours.  DDimer No results for input(s): DDIMER in the last 168 hours.   Radiology/Studies:  CT HEAD WO CONTRAST  Result Date: 06/02/2021 CLINICAL DATA:  Delirium.  Weakness. EXAM: CT HEAD WITHOUT CONTRAST TECHNIQUE: Contiguous axial images were obtained from the base of the skull through the vertex without intravenous contrast. COMPARISON:  01/12/2020 FINDINGS: Brain: There is no evidence of an acute infarct, intracranial hemorrhage, mass, midline shift, or extra-axial fluid collection. The ventricles and sulci are normal. Vascular: Calcified atherosclerosis at the skull base. No hyperdense vessel. Skull: No fracture or suspicious osseous lesion. Sinuses/Orbits: Visualized paranasal sinuses and mastoid air cells are clear aside from a subcentimeter osteoma within a left ethmoid air cell. Bilateral cataract extraction. Other: None. IMPRESSION: Unremarkable CT appearance of the brain. Electronically Signed   By: Logan Bores M.D.   On: 06/02/2021 19:40   US RENAL  Result Date: 05/31/2021 CLINICAL DATA:  Acute renal failure EXAM: RENAL / URINARY TRACT ULTRASOUND COMPLETE COMPARISON:  None. FINDINGS: Right Kidney: Renal measurements: 9.9 x 4.1 x 6.2 cm = volume: 141.6 mL. Echogenic renal parenchyma. No mass or hydronephrosis visualized. Trace perinephric fluid. Left Kidney: Renal measurements:  10.0 x 4.8 x 5.9 cm = volume: 148.5 mL. Echogenic renal parenchyma. No mass or hydronephrosis visualized. Trace perinephric fluid. Bladder: Decompressed by indwelling Foley catheter. Other: None. IMPRESSION: Echogenic renal parenchyma, suggesting medical renal disease. Trace perinephric fluid bilaterally.  No hydronephrosis. Bladder decompressed by an indwelling Foley catheter. Electronically Signed   By: Julian Hy M.D.   On: 05/31/2021 01:35   DG Chest Port 1 View  Result Date: 05/30/2021 CLINICAL DATA:  Weakness EXAM: PORTABLE CHEST 1 VIEW COMPARISON:  01/12/2020 FINDINGS: The heart size and mediastinal contours are within normal limits. Both lungs are clear. The visualized skeletal structures are unremarkable. IMPRESSION: No active disease. Electronically Signed   By: Ulyses Jarred M.D.   On: 05/30/2021 20:30   DG Abd 2 Views  Result Date: 06/02/2021 CLINICAL DATA:  Vomiting EXAM: ABDOMEN - 2 VIEW COMPARISON:  None. FINDINGS: The bowel gas pattern is normal. There is no evidence of free air. No radio-opaque calculi or other significant radiographic abnormality is seen. IMPRESSION: Nonobstructive pattern of bowel gas.  No free air in the abdomen. Electronically Signed   By: Eddie Candle M.D.   On: 06/02/2021 13:11     Assessment and Plan:   Katelee Schupp is a 70 y.o. female with a history of hypertension, type 2 diabetes, GERD, anemia, and chronic back and knee pain. Patient admitted with acute renal failure after presents with generalized weakness and frequent falls.  Bradycardia - Cardiology consulted for bradycardia noted overnight. Rates as low as the 40s. Asymptomatic with this. - EKG showed sinus bradycardia with rate of 47 bpm with no  acute ischemic changes.  - Telemetry shows sinus rhythm with rates in the 50s to 60s and multiple PACs/PVCs, sometimes in bigeminy pattern. Also showed possible a few possible junctional escape beats/short runs of junctional rhythm with rates in the 50s.   - Potassium initially 5.7 in the setting of AKI. Improved to 3.9 today.  - TSH normal.  - Will check Magnesium.  - Will check Echo. - Patient is not on any AV nodal agents. Avoid for now. - Will continue to monitor on telemetry. Will likely need outpatient event monitor at discharge.  Palpitations Paroxysmal SVT/PAT - Patient notes episodes of "heart flutter" with associated chest pain, "labored breathing," dizziness, and near syncope. Multiple short episodes of SVT/PAT noted on telemetry. However, patient denies any of these episodes this admission. - Will hold off on AV nodal agents for now given low resting heart rates. - Will continue to monitor on telemetry. Will likely need outpatient event monitor at discharge.  Elevated Troponin - Initially high-sensitivity troponin was negative x2. However, repeat troponin this today after episode of bradycardia was mildly elevated at 112 >> 116 >> 99.  - EKG shows no acute ischemic changes.  - No chest pain outside of episodes of palpitations.  - Will check Echo. - Troponin trend not consistent with ischemia. Possibly due to AKI. Further recommendation pending Echo results.  Hypertension - History of hypertension but patient was hypotensive on arrival with systolic BP in the 40X.  - Home Amlodipine and Lisinopril on hold.  - BP improved with systolic BP in the 735H to 120s. - Continue to monitor.  Hyperlipidemia  - On Crestor 77m daily at home. Currently on hold.  - OK to restart home Crestor.  Type 2 Diabetes Mellitus - Hemoglobin A1c 6.4. - On Metformin at home. - Management per primary team.  AKI - Creatinine 6.18 on admission. Baseline around 0.7 to 1.0.  - Likely due to hypovolemia and poor PO intake. - Treated with IV fluids and bicarb. Slowly improving. Creatinine 1.72 today. - Renal ultrasound showed chronic medical disease. - Continue to avoid Nephrotoxic agents. Lasix and Lisinopril on hold. - Continue to monitor  closely. - Management per primary team.  Hyperkalemia - In the setting of AKI. Potassium 5.7 on presentation.  - Improved and 3.9 today. - Continue to monitor closely.  Social Concerns - Patient reported she has not been eating much because she cannot afford food.  - Will consult Case Manager/Social Work.  Otherwise, per primary team: - Metabolic encephalopathy - Normal anion gap metabolic acidosis - Normocytic anemia - Tremor   Risk Assessment/Risk Scores:     For questions or updates, please contact [facility] Please consult [email or web address] for contact info under  Signed, CDarreld Mclean PA-C  06/03/2021 1:18 PM As above, patient seen and examined.  Briefly she is a 70year old female with past medical history of hypertension, diabetes mellitus, gastroesophageal reflux disease, chronic pain admitted with generalized weakness and acute renal failure for evaluation of bradycardia, palpitations and elevated troponin.  Patient is an extremely poor historian.  However over the past several months she has had complaints of bilateral upper extremity tremors, generalized weakness to the point she falls.  She has dizziness with standing but has had decreased p.o. intake.  She has not had syncope by report.  She has palpitations described as her heart racing associated with dyspnea and chest pain.  She otherwise does not have dyspnea on exertion, orthopnea, exertional chest pain.  She was admitted  with acute renal failure and hypotension.  Her renal function has improved with IV fluids.  She was noted to have heart rate in the 40s at times and cardiology asked to evaluate.  Also noted to have mildly elevated troponin. Initial blood work showed BUN 122, creatinine 6.18, potassium 5.7, troponin 11, hemoglobin 8.7.  Electrocardiogram showed sinus rhythm, PAC, no ST changes.  Hemoglobin A1c 6.4.  Follow-up laboratories show improvement in renal function.  BUN now 44 with creatinine 1.72.   Troponin 112, 116 and 99.  Hemoglobin 8.4.  1 Sinus bradycardia-not clear that bradycardia is contributing to symptoms and no prolonged pauses.  We will arrange echocardiogram to assess LV function.  Follow on telemetry.  No indication for pacemaker at this point.  We will avoid AV nodal blocking agents.  2 history of palpitations-patient describes episodes of heart racing associated with dyspnea and chest pain.  We will arrange an outpatient event monitor.  3 acute kidney injury-patient had severe renal dysfunction at time of admission.  This has improved with hydration.  Continue to follow.  4 possible early dementia-further evaluation per primary care.  Also has complaints of bilateral upper extremity tremors.  May benefit from neurology consult at some point.  5 anemia-likely from renal insufficiency with contribution this admission from dilution.  6 elevated troponin-minimal elevation in troponin with normal electrocardiogram.  No plans for further ischemia evaluation at this point unless LV function abnormal on echo.  Note she has had chest pain associated with palpitations but does not have exertional chest pain otherwise.  Kirk Ruths, MD

## 2021-06-03 NOTE — Progress Notes (Signed)
Patient resting comfortably without any distress throughout the shift. Nausea improved. No episodes of vomiting until this time. Patient tolerated diet well. Heart Rate remains to be at 50's throughout the shift. Foley catheter was discontinued and patient was able to voild self. Patient's sister at bedside at all times. All needs met at this time. Bed kept in low position and locked. Call bell in reach.
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
## 2021-06-03 NOTE — Progress Notes (Signed)
CrCl<30 ml/min. Ok to reduce Cymbalta to 30mg  qday per Dr. .  Butler Denmark, PharmD, BCIDP, AAHIVP, CPP Infectious Disease Pharmacist 06/03/2021 1:11 PM

## 2021-06-04 ENCOUNTER — Other Ambulatory Visit: Payer: Self-pay | Admitting: Student

## 2021-06-04 DIAGNOSIS — R002 Palpitations: Secondary | ICD-10-CM

## 2021-06-04 DIAGNOSIS — R001 Bradycardia, unspecified: Secondary | ICD-10-CM

## 2021-06-04 DIAGNOSIS — E860 Dehydration: Secondary | ICD-10-CM

## 2021-06-04 LAB — CBC
HCT: 26.8 % — ABNORMAL LOW (ref 36.0–46.0)
Hemoglobin: 8.2 g/dL — ABNORMAL LOW (ref 12.0–15.0)
MCH: 26.4 pg (ref 26.0–34.0)
MCHC: 30.6 g/dL (ref 30.0–36.0)
MCV: 86.2 fL (ref 80.0–100.0)
Platelets: 201 10*3/uL (ref 150–400)
RBC: 3.11 MIL/uL — ABNORMAL LOW (ref 3.87–5.11)
RDW: 14.8 % (ref 11.5–15.5)
WBC: 6.7 10*3/uL (ref 4.0–10.5)
nRBC: 0 % (ref 0.0–0.2)

## 2021-06-04 LAB — MAGNESIUM: Magnesium: 1.8 mg/dL (ref 1.7–2.4)

## 2021-06-04 LAB — BASIC METABOLIC PANEL
Anion gap: 6 (ref 5–15)
BUN: 33 mg/dL — ABNORMAL HIGH (ref 8–23)
CO2: 23 mmol/L (ref 22–32)
Calcium: 7.8 mg/dL — ABNORMAL LOW (ref 8.9–10.3)
Chloride: 111 mmol/L (ref 98–111)
Creatinine, Ser: 1.46 mg/dL — ABNORMAL HIGH (ref 0.44–1.00)
GFR, Estimated: 39 mL/min — ABNORMAL LOW (ref >60)
Glucose, Bld: 122 mg/dL — ABNORMAL HIGH (ref 70–99)
Potassium: 4 mmol/L (ref 3.5–5.1)
Sodium: 140 mmol/L (ref 135–145)

## 2021-06-04 LAB — GLUCOSE, CAPILLARY
Glucose-Capillary: 108 mg/dL — ABNORMAL HIGH (ref 70–99)
Glucose-Capillary: 151 mg/dL — ABNORMAL HIGH (ref 70–99)

## 2021-06-04 MED ORDER — ENSURE ENLIVE PO LIQD: 237.0000 mL | Freq: Three times a day (TID) | ORAL | 12 refills | Status: AC

## 2021-06-04 MED ORDER — ACETAMINOPHEN 325 MG PO TABS: 650.0000 mg | ORAL_TABLET | Freq: Four times a day (QID) | ORAL | Status: AC | PRN

## 2021-06-04 MED ORDER — DULOXETINE HCL 30 MG PO CPEP: 90.0000 mg | ORAL_CAPSULE | Freq: Every day | ORAL | 0 refills | Status: AC

## 2021-06-04 NOTE — Progress Notes (Signed)
Discharge instructions (including medications) discussed with and copy provided to patient/caregiver 

## 2021-06-04 NOTE — Progress Notes (Signed)
Patient discharged via wheel chair. Patient hemodynamically stable during discharge.  

## 2021-06-04 NOTE — Progress Notes (Addendum)
Progress Note  Patient Name: Sabrina Rodriguez Date of Encounter: 06/04/2021  [facility] Cardiologist: Lance Muss, MD   Subjective   No acute overnight events. Doing well this morning. No cardiac complaints. No chest pain, shortness of breath, palpitations, lightheadedness, dizziness while being here.  Inpatient Medications    Scheduled Meds:  DULoxetine  30 mg Oral Daily   heparin  5,000 Units Subcutaneous Q8H   insulin aspart  0-6 Units Subcutaneous TID WC   ondansetron  4 mg Oral Q4H   Or   ondansetron (ZOFRAN) IV  4 mg Intravenous Q4H   Continuous Infusions:  PRN Meds: acetaminophen **OR** acetaminophen   Vital Signs    Vitals:   06/03/21 1945 06/04/21 0543 06/04/21 0606 06/04/21 0731  BP: 137/65 (!) 125/49    Pulse: (!) 49 (!) 53    Resp: 16 17    Temp: 99.1 F (37.3 C) 98.7 F (37.1 C)    TempSrc: Oral     SpO2: 98% 95%  96%  Weight:   62.6 kg   Height:        Intake/Output Summary (Last 24 hours) at 06/04/2021 0815 Last data filed at 06/04/2021 0622 Gross per 24 hour  Intake 1073.16 ml  Output 850 ml  Net 223.16 ml   Last 3 Weights 06/04/2021 06/01/2021 05/30/2021  Weight (lbs) 138 lb 0.1 oz 138 lb 0.1 oz 130 lb  Weight (kg) 62.6 kg 62.6 kg 58.968 kg      Telemetry    Sinus rhythm with rates in the 50s to 60s. - Personally Reviewed  ECG    No new ECG tracing today. - Personally Reviewed  Physical Exam   GEN: No acute distress.   Neck: No JVD. Cardiac: Bradycardic with regular rhythm. No murmurs, rubs, or gallops. Radial pulses 2+ and equal bilaterally. Respiratory: Clear to auscultation bilaterally. GI: Soft, non-distended, and non-tender. MS: No lower extremity edema. No deformity. Skin: Warm and dry. Neuro:  No focal deficits. Psych: Normal affect. Responds appropriately.  Labs    High Sensitivity Troponin:   Recent Labs  Lab 05/30/21 1954 05/30/21 2314 06/03/21 0402 06/03/21 0629 06/03/21 0753  TROPONINIHS 11 13 112* 116* 99*       Chemistry Recent Labs  Lab 05/30/21 1954 05/31/21 0402 06/02/21 1712 06/03/21 0258 06/04/21 0042  NA 136   < > 148* 145 140  K 5.7*   < > 4.1 3.9 4.0  CL 117*   < > 116* 114* 111  CO2 <7*   < > 22 21* 23  GLUCOSE 96   < > 139* 95 122*  BUN 122*   < > 48* 44* 33*  CREATININE 6.18*   < > 1.84* 1.72* 1.46*  CALCIUM 8.5*   < > 8.2* 8.0* 7.8*  PROT 6.0*  --  5.5*  --   --   ALBUMIN 3.3*  --  2.9*  --   --   AST 37  --  39  --   --   ALT 30  --  33  --   --   ALKPHOS 62  --  54  --   --   BILITOT 0.5  --  1.6*  --   --   GFRNONAA 7*   < > 30* 32* 39*  ANIONGAP NOT CALCULATED   < > 10 10 6    < > = values in this interval not displayed.     Hematology Recent Labs  Lab 06/02/21 1712 06/03/21 08/04/21  06/04/21 0042  WBC 4.4 7.1 6.7  RBC 3.31* 3.11* 3.11*  HGB 8.8* 8.4* 8.2*  HCT 27.4* 26.2* 26.8*  MCV 82.8 84.2 86.2  MCH 26.6 27.0 26.4  MCHC 32.1 32.1 30.6  RDW 14.5 14.7 14.8  PLT 217 191 201    BNPNo results for input(s): BNP, PROBNP in the last 168 hours.   DDimer No results for input(s): DDIMER in the last 168 hours.   Radiology    CT HEAD WO CONTRAST  Result Date: 06/02/2021 CLINICAL DATA:  Delirium.  Weakness. EXAM: CT HEAD WITHOUT CONTRAST TECHNIQUE: Contiguous axial images were obtained from the base of the skull through the vertex without intravenous contrast. COMPARISON:  01/12/2020 FINDINGS: Brain: There is no evidence of an acute infarct, intracranial hemorrhage, mass, midline shift, or extra-axial fluid collection. The ventricles and sulci are normal. Vascular: Calcified atherosclerosis at the skull base. No hyperdense vessel. Skull: No fracture or suspicious osseous lesion. Sinuses/Orbits: Visualized paranasal sinuses and mastoid air cells are clear aside from a subcentimeter osteoma within a left ethmoid air cell. Bilateral cataract extraction. Other: None. IMPRESSION: Unremarkable CT appearance of the brain. Electronically Signed   By: Sebastian AcheAllen  Grady M.D.    On: 06/02/2021 19:40   DG Abd 2 Views  Result Date: 06/02/2021 CLINICAL DATA:  Vomiting EXAM: ABDOMEN - 2 VIEW COMPARISON:  None. FINDINGS: The bowel gas pattern is normal. There is no evidence of free air. No radio-opaque calculi or other significant radiographic abnormality is seen. IMPRESSION: Nonobstructive pattern of bowel gas.  No free air in the abdomen. Electronically Signed   By: Lauralyn PrimesAlex  Bibbey M.D.   On: 06/02/2021 13:11   ECHOCARDIOGRAM COMPLETE  Result Date: 06/03/2021    ECHOCARDIOGRAM REPORT   Patient Name:   [name] Date of Exam: 06/03/2021 Medical Rec #:  [id number]    Height:       63.0 in Accession #:    [id number]   Weight:       138.0 lb Date of Birth:  05/15/1951     BSA:          1.652 m Patient Age:    70 years     BP:           118/64 mmHg Patient Gender: F            HR:           53 bpm. Exam Location:  Inpatient Procedure: 2D Echo Indications:    abnormal ecg  History:        Patient has no prior history of Echocardiogram examinations.                 Risk Factors:Diabetes and Hypertension.  Sonographer:    Delcie RochLauren Pennington Referring Phys: [id number]: [id number] CALLIE E GOODRICH IMPRESSIONS  1. Left ventricular ejection fraction, by estimation, is 55 to 60%. The left ventricle has normal function. The left ventricle has no regional wall motion abnormalities. Left ventricular diastolic parameters are consistent with Grade II diastolic dysfunction (pseudonormalization).  2. Right ventricular systolic function is normal. The right ventricular size is normal. Tricuspid regurgitation signal is inadequate for assessing PA pressure.  3. Left atrial size was mildly dilated.  4. The mitral valve is normal in structure. Trivial mitral valve regurgitation. No evidence of mitral stenosis.  5. The aortic valve is tricuspid. Aortic valve regurgitation is not visualized. No aortic stenosis is present.  6. The inferior vena cava is dilated in size with >50% respiratory variability, suggesting right  atrial  pressure of 8 mmHg. FINDINGS  Left Ventricle: Left ventricular ejection fraction, by estimation, is 55 to 60%. The left ventricle has normal function. The left ventricle has no regional wall motion abnormalities. The left ventricular internal cavity size was normal in size. There is  no left ventricular hypertrophy. Left ventricular diastolic parameters are consistent with Grade II diastolic dysfunction (pseudonormalization). Right Ventricle: The right ventricular size is normal. No increase in right ventricular wall thickness. Right ventricular systolic function is normal. Tricuspid regurgitation signal is inadequate for assessing PA pressure. Left Atrium: Left atrial size was mildly dilated. Right Atrium: Right atrial size was normal in size. Pericardium: There is no evidence of pericardial effusion. Mitral Valve: The mitral valve is normal in structure. Trivial mitral valve regurgitation. No evidence of mitral valve stenosis. Tricuspid Valve: The tricuspid valve is normal in structure. Tricuspid valve regurgitation is not demonstrated. Aortic Valve: The aortic valve is tricuspid. Aortic valve regurgitation is not visualized. No aortic stenosis is present. Pulmonic Valve: The pulmonic valve was normal in structure. Pulmonic valve regurgitation is trivial. Aorta: The aortic root is normal in size and structure. Venous: The inferior vena cava is dilated in size with greater than 50% respiratory variability, suggesting right atrial pressure of 8 mmHg. IAS/Shunts: No atrial level shunt detected by color flow Doppler.  LEFT VENTRICLE PLAX 2D LVIDd:         4.10 cm  Diastology LVIDs:         2.60 cm  LV e' medial:    8.27 cm/s LV PW:         0.90 cm  LV E/e' medial:  10.0 LV IVS:        0.90 cm  LV e' lateral:   11.40 cm/s LVOT diam:     1.80 cm  LV E/e' lateral: 7.3 LV SV:         69 LV SV Index:   42 LVOT Area:     2.54 cm  RIGHT VENTRICLE             IVC RV S prime:     20.60 cm/s  IVC diam: 2.40 cm TAPSE (M-mode):  2.1 cm LEFT ATRIUM             Index       RIGHT ATRIUM           Index LA diam:        3.80 cm 2.30 cm/m  RA Area:     11.90 cm LA Vol (A2C):   61.4 ml 37.18 ml/m RA Volume:   27.60 ml  16.71 ml/m LA Vol (A4C):   54.0 ml 32.70 ml/m LA Biplane Vol: 59.9 ml 36.27 ml/m  AORTIC VALVE LVOT Vmax:   133.00 cm/s LVOT Vmean:  79.900 cm/s LVOT VTI:    0.272 m  AORTA Ao Root diam: 2.50 cm Ao Asc diam:  2.60 cm MITRAL VALVE MV Area (PHT): 2.52 cm    SHUNTS MV Decel Time: 301 msec    Systemic VTI:  0.27 m MV E velocity: 83.10 cm/s  Systemic Diam: 1.80 cm MV A velocity: 72.70 cm/s MV E/A ratio:  1.14 Marca Ancona MD Electronically signed by Marca Ancona MD Signature Date/Time: 06/03/2021/5:31:09 PM    Final     Cardiac Studies   Echocardiogram 06/03/2021: Impressions:  1. Left ventricular ejection fraction, by estimation, is 55 to 60%. The  left ventricle has normal function. The left ventricle has no regional  wall motion abnormalities.  Left ventricular diastolic parameters are  consistent with Grade II diastolic  dysfunction (pseudonormalization).   2. Right ventricular systolic function is normal. The right ventricular  size is normal. Tricuspid regurgitation signal is inadequate for assessing  PA pressure.   3. Left atrial size was mildly dilated.   4. The mitral valve is normal in structure. Trivial mitral valve  regurgitation. No evidence of mitral stenosis.   5. The aortic valve is tricuspid. Aortic valve regurgitation is not  visualized. No aortic stenosis is present.   6. The inferior vena cava is dilated in size with >50% respiratory  variability, suggesting right atrial pressure of 8 mmHg.    Patient Profile     70 y.o. female with a history of hypertension, type 2 diabetes, GERD, anemia, and chronic back and knee pain who was admitted on 05/30/2021 with acute renal failure after presenting with generalized weakness and frequent falls. Cardiology consulted  for further evaluation of  bradycardia at the request of Dr. Butler Denmark.  Assessment & Plan    Sinus Bradycardia - Rates in the 50s. No significant pauses. - Potassium initially 5.7 in the setting of AKI. Improved to 4.0 today. - Magnesium 1.4 yesterday. Supplemented. - TSH normal. - Echo showed LVEF of 55-60%. - Patient is not on any AV nodal agents. Continue to avoid these. - Will arrange outpatient event monitor at discharge. - No need for pacemaker at this time.   Palpitations Paroxysmal SVT/PAT - Patient notes episodes of "heart flutter" with associated chest pain, "labored breathing," dizziness, and near syncope. Multiple short episodes of SVT/PAT noted on telemetry. However, patient denies any of these episodes this admission. - Will hold off on AV nodal agents for now given low resting heart rates. - Will continue to monitor on telemetry. Will likely need outpatient event monitor at discharge.   Elevated Troponin - Initially high-sensitivity troponin was negative x2. However, repeat troponin this today after episode of bradycardia was mildly elevated at 112 >> 116 >> 99. - EKG shows no acute ischemic changes. - No chest pain outside of episodes of palpitations. - Echo showed normal LV function and normal wall motion. - Troponin trend not consistent with ACS. No further ischemic evaluation planned at this time.   Hypertension - History of hypertension but patient was hypotensive on arrival with systolic BP in the 80s. Improved with IV fluids and holding home medications (Amlodipine and Lisinopril). Systolic BP currently in the 110s to 130s. - Continue to monitor.   Hyperlipidemia - On Crestor 20mg  daily at home. Currently on hold. - OK to restart home Crestor.   Type 2 Diabetes Mellitus - Hemoglobin A1c 6.4. - On Metformin at home. - Management per primary team.   AKI - Creatinine 6.18 on admission. Baseline around 0.7 to 1.0. Likely due to hypovolemia and poor PO intake. Treated with IV fluids and  bicarb. Slowly improving. Creatinine 1.46 today. - Renal ultrasound showed chronic medical disease. - Continue to avoid Nephrotoxic agents. Lasix and Lisinopril on hold. - Continue to monitor closely. - Management per primary team.   Hyperkalemia - In the setting of AKI. Potassium 5.7 on presentation. - Improved and 4.0 today. - Continue to monitor.  Social Concerns - Patient reported she has not been eating much because she cannot afford food. - Will consult Case Manager/Social Work.   Otherwise, per primary team: - Metabolic encephalopathy - Possible early dementia - Normal anion gap metabolic acidosis - Normocytic anemia - Tremor  For questions or updates,  please contact [facility] Please consult [email or web address] for contact info under        Signed, Corrin Parker, PA-C  06/04/2021, 8:15 AM   As above, patient seen and examined.  She denies chest pain, dyspnea or palpitations.  I have personally reviewed the patient's telemetry.  She has sinus to sinus bradycardia with PVCs.  There is no prolonged pauses.  Echocardiogram shows normal LV function.  No further cardiac evaluation planned.  Once she is discharged would arrange outpatient event monitor (to assess palpitations) and we will have her follow-up with APP 4 to 6 weeks following discharge and me 3 months later.  Cardiology will sign off.  Please call with questions.  Olga Millers, MD

## 2021-06-04 NOTE — Discharge Summary (Signed)
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 Healthcare Providers:  SeriousBroker.it  This test is no t yet approved or cleared by the [place] FDA and  has been authorized for detection and/or diagnosis of SARS-CoV-2 by FDA under an Emergency Use Authorization (EUA). This EUA will remain  in effect (meaning this test can be used) for the duration of the COVID-19 declaration under Section 564(b)(1) of the Act, 21 U.S.C.section 360bbb-3(b)(1), unless the authorization is terminated  or revoked sooner.       Influenza A by PCR NEGATIVE NEGATIVE Final   Influenza B by PCR NEGATIVE NEGATIVE Final    Comment: (NOTE) The Xpert Xpress SARS-CoV-2/FLU/RSV plus assay is intended as an aid in the diagnosis of influenza from  Nasopharyngeal swab specimens and should not be used as a sole basis for treatment. Nasal washings and aspirates are unacceptable for Xpert Xpress SARS-CoV-2/FLU/RSV testing.  Fact Sheet for Patients: BloggerCourse.com  Fact Sheet for Healthcare Providers: SeriousBroker.it  This test is not yet approved or cleared by the [place] FDA and has been authorized for detection and/or diagnosis of SARS-CoV-2 by FDA under an Emergency Use Authorization (EUA). This EUA will remain in effect (meaning this test can be used) for the duration of the COVID-19 declaration under Section 564(b)(1) of the Act, 21 U.S.C. section 360bbb-3(b)(1), unless the authorization is terminated or revoked.  Performed at [facility], [address] [place], [place] [address]  Time coordinating discharge in minutes: 65  SIGNED:   Calvert Cantor, MD  [facility] 06/04/2021, 2:05 PM

## 2021-06-04 NOTE — Plan of Care (Signed)
  Problem: Education: Goal: Knowledge of General Education information will improve Description: Including pain rating scale, medication(s)/side effects and non-pharmacologic comfort measures Outcome: Adequate for Discharge   

## 2021-06-04 NOTE — Evaluation (Signed)
Occupational Therapy Evaluation Patient Details Name: Margia Wiesen MRN: [id number] DOB: [birth year] Today's Date: 06/04/2021    History of Present Illness 70 y.o. female presenting to the emergency department 05/30/21 for evaluation of fatigue, confusion, and loss of appetite. +acute renal failure with hyperkalemia; encephalopathy;  PMH significant for hypertension, type 2 diabetes mellitus, and chronic back and knee pain; pt reports several week h/o falls with and without warning (sometimes has severe "shakes" and then falls, sometimes no warning) reports +LOC when falls   Clinical Impression   PTA patient was living alone in apartment with level entry and was grossly I  with ADLs/IADLs without AD. Patient currently functioning below baseline demonstrating observed ADLs including LB dressing and grooming standing at sink level with supervision A for safety/line management and cues for safety with RW. Patient also limited by deficits listed below including generalized weakness and decreased balance and would benefit from continued acute OT services in prep for safe d/c home. Patient likely to progress well. OT will follow acutely.      Follow Up Recommendations  No OT follow up;Supervision - Intermittent    Equipment Recommendations  Other (comment) (rolling walker)    Recommendations for Other Services       Precautions / Restrictions Precautions Precautions: Fall Precaution Comments: many falls without warning; (+) orthostatics in standing      Mobility Bed Mobility Overal bed mobility: Independent                  Transfers Overall transfer level: Needs assistance Equipment used: None Transfers: Sit to/from Stand Sit to Stand: Supervision         General transfer comment: Supervision A for safety/line management.    Balance Overall balance assessment: Needs assistance Sitting-balance support: No upper extremity supported;Feet unsupported Sitting balance-Leahy  Scale: Good     Standing balance support: Bilateral upper extremity supported Standing balance-Leahy Scale: Fair Standing balance comment: Able to maintain static standing balance without UE support during grooming tasks standing at sink level. Use of RW with mobility.                           ADL either performed or assessed with clinical judgement   ADL Overall ADL's : Needs assistance/impaired     Grooming: Supervision/safety;Standing Grooming Details (indicate cue type and reason): 2/3 grooming tasks standing at sink level with supervision A. Cues for walker management.             Lower Body Dressing: Supervision/safety;Sit to/from stand Lower Body Dressing Details (indicate cue type and reason): Supervision A for safety/line management. Toilet Transfer: Firefighter Details (indicate cue type and reason): Simulated with use of RW.         Functional mobility during ADLs: Supervision/safety       Vision Baseline Vision/History: Wears glasses Wears Glasses: Reading only Patient Visual Report: No change from baseline       Perception     Praxis      Pertinent Vitals/Pain Pain Assessment: No/denies pain     Hand Dominance     Extremity/Trunk Assessment Upper Extremity Assessment Upper Extremity Assessment: Overall WFL for tasks assessed   Lower Extremity Assessment Lower Extremity Assessment: Generalized weakness   Cervical / Trunk Assessment Cervical / Trunk Assessment: Normal   Communication Communication Communication: HOH   Cognition Arousal/Alertness: Awake/alert Behavior During Therapy: WFL for tasks assessed/performed Overall Cognitive Status: Within Functional Limits for tasks assessed  General Comments: cognition not specifically assessed   General Comments  Vitals: Supine Bp 136/65, HR 53, SpO2 98% on RA. EOB 141/70, 59bpm, 97%. Standing 120/68, 68bpm,  100%. No c/o dizziness but noted 21 point drop in SBP with standing.    Exercises     Shoulder Instructions      Home Living Family/patient expects to be discharged to:: Private residence Living Arrangements: Alone Available Help at Discharge: Family;Available PRN/intermittently Type of Home: Apartment Home Access: Level entry     Home Layout: One level     Bathroom Shower/Tub: Tub/shower unit;Curtain   Firefighter: Standard     Home Equipment: Toilet riser (toilet riser with handles)          Prior Functioning/Environment Level of Independence: Independent        Comments: had recently (weeks) begun having shaking spells and then falls; reports ALOT of falls        OT Problem List: Decreased strength;Impaired balance (sitting and/or standing);Decreased knowledge of use of DME or AE      OT Treatment/Interventions: Self-care/ADL training;Therapeutic exercise;Energy conservation;DME and/or AE instruction;Patient/family education;Balance training    OT Goals(Current goals can be found in the care plan section) Acute Rehab OT Goals Patient Stated Goal: find out why she is falling OT Goal Formulation: With patient Time For Goal Achievement: 06/18/21 Potential to Achieve Goals: Good ADL Goals Pt Will Perform Grooming: Independently;standing Pt Will Perform Upper Body Dressing: Independently Pt Will Perform Lower Body Dressing: Independently;sit to/from stand Pt Will Transfer to Toilet: Independently;ambulating Pt Will Perform Toileting - Clothing Manipulation and hygiene: Independently;sit to/from stand Additional ADL Goal #1: Patient will recall 3 strategies to reduce risk of falls in prep for safe return home.  OT Frequency: Min 2X/week   Barriers to D/C:            Co-evaluation              AM-PAC OT "6 Clicks" Daily Activity     Outcome Measure Help from another person eating meals?: None Help from another person taking care of personal  grooming?: A Little Help from another person toileting, which includes using toliet, bedpan, or urinal?: A Little Help from another person bathing (including washing, rinsing, drying)?: A Little Help from another person to put on and taking off regular upper body clothing?: A Little Help from another person to put on and taking off regular lower body clothing?: A Little 6 Click Score: 19   End of Session Equipment Utilized During Treatment: Gait belt;Rolling walker  Activity Tolerance: Patient tolerated treatment well Patient left: in chair;with call bell/phone within reach;with chair alarm set  OT Visit Diagnosis: Unsteadiness on feet (R26.81);Repeated falls (R29.6);Muscle weakness (generalized) (M62.81)                Time: 0349-1791 OT Time Calculation (min): 18 min Charges:  OT General Charges $OT Visit: 1 Visit OT Evaluation $OT Eval Low Complexity: 1 Low  Haseeb Fiallos H. OTR/L Supplemental OT, Department of rehab services [phone]  Lorry Anastasi R H. 06/04/2021, 8:32 AM

## 2021-06-04 NOTE — Progress Notes (Signed)
Add on Mg level and follow.

## 2021-06-04 NOTE — TOC Progression Note (Signed)
Transition of Care [facility]) - Progression Note    Patient Details  Name: Sabrina Rodriguez MRN: [id number] Date of Birth: [birth year]  Transition of Care [facility]) CM/SW Contact  Beckie Busing, RN Phone Number:[phone]  06/04/2021, 2:14 PM  Clinical Narrative:    Patient to discharge home with [facility] services. HH has been set up with [facility]. DME rolling walker to be delivered to the room per [employer]. No other needs noted at this time. TOC will sign off.   Expected Discharge Plan: Home w Home Health Services Barriers to Discharge: No Barriers Identified  Expected Discharge Plan and Services Expected Discharge Plan: Home w Home Health Services In-house Referral: NA Discharge Planning Services: CM Consult Post Acute Care Choice: NA Living arrangements for the past 2 months: Single Family Home Expected Discharge Date: 06/04/21               DME Arranged: Dan Humphreys rolling         HH Arranged: PT, OT HH Agency: [facility] Date [facility] Agency Contacted: 06/04/21 Time HH Agency Contacted: [id number] Representative spoke with at [facility] Agency: Amy (start of care for Monday)   Social Determinants of Health (SDOH) Interventions    Readmission Risk Interventions Readmission Risk Prevention Plan 06/04/2021  Transportation Screening Complete  PCP or Specialist Appt within 5-7 Days Complete  Home Care Screening Complete  Medication Review (RN CM) Complete  Some recent data might be hidden

## 2021-06-07 ENCOUNTER — Encounter: Payer: Self-pay | Admitting: *Deleted

## 2021-06-07 NOTE — Progress Notes (Unsigned)
Patient ID: Sabrina Rodriguez, female   DOB: 05-18-1951, 70 y.o.   MRN: [id number] Patient enrolled for [employer] to ship a 30 day cardiac event monitor to address on file. Letter with instructions mailed to patient.

## 2021-06-09 DIAGNOSIS — Z7984 Long term (current) use of oral hypoglycemic drugs: Secondary | ICD-10-CM | POA: Diagnosis not present

## 2021-06-09 DIAGNOSIS — E1169 Type 2 diabetes mellitus with other specified complication: Secondary | ICD-10-CM | POA: Diagnosis not present

## 2021-06-09 DIAGNOSIS — Z09 Encounter for follow-up examination after completed treatment for conditions other than malignant neoplasm: Secondary | ICD-10-CM | POA: Diagnosis not present

## 2021-06-09 DIAGNOSIS — D508 Other iron deficiency anemias: Secondary | ICD-10-CM | POA: Diagnosis not present

## 2021-06-09 DIAGNOSIS — N179 Acute kidney failure, unspecified: Secondary | ICD-10-CM | POA: Diagnosis not present

## 2021-06-09 DIAGNOSIS — N1832 Chronic kidney disease, stage 3b: Secondary | ICD-10-CM | POA: Diagnosis not present

## 2021-06-09 DIAGNOSIS — M419 Scoliosis, unspecified: Secondary | ICD-10-CM | POA: Diagnosis not present

## 2021-06-09 DIAGNOSIS — I10 Essential (primary) hypertension: Secondary | ICD-10-CM | POA: Diagnosis not present

## 2021-06-15 ENCOUNTER — Ambulatory Visit (INDEPENDENT_AMBULATORY_CARE_PROVIDER_SITE_OTHER): Payer: Medicare Other

## 2021-06-15 DIAGNOSIS — R001 Bradycardia, unspecified: Secondary | ICD-10-CM

## 2021-06-15 DIAGNOSIS — R002 Palpitations: Secondary | ICD-10-CM | POA: Diagnosis not present

## 2021-06-23 DIAGNOSIS — E7800 Pure hypercholesterolemia, unspecified: Secondary | ICD-10-CM | POA: Diagnosis not present

## 2021-06-23 DIAGNOSIS — I10 Essential (primary) hypertension: Secondary | ICD-10-CM | POA: Diagnosis not present

## 2021-06-23 DIAGNOSIS — D508 Other iron deficiency anemias: Secondary | ICD-10-CM | POA: Diagnosis not present

## 2021-07-06 ENCOUNTER — Ambulatory Visit: Payer: Medicare Other | Admitting: Cardiology

## 2021-07-12 ENCOUNTER — Telehealth: Payer: Self-pay

## 2021-07-12 NOTE — Telephone Encounter (Signed)
Called patient left message on personal voice mail we received a fax from [employer] monitor they have not received any transmissions from you for more than 2 days.They have been unable to reach you.Advised to call [employer] at [phone].

## 2021-07-15 NOTE — Progress Notes (Deleted)
Cardiology Office Note:    Date:  07/15/2021   ID:  Sabrina Rodriguez, DOB December 18, 1950, MRN [id number]  PCP:  [name], MD  Cardiologist:  None  Electrophysiologist:  None   Referring MD: [name], MD   Chief Complaint: hospital follow-up for bradycardia  History of Present Illness:    Sabrina Rodriguez is a 70 y.o. female with a history of bradycardia, hypertension, type 2 diabetes, GERD, anemia, and chronic back and knee pain who presents today for hospital follow-up of bradycardia.  Patient was admitted from 05/30/2021 to 06/04/2021 for acute renal failure after presenting with generalized weakness and frequent falls. She was found to be hypotensive in the field and was treated with fluids. Creatinine was 6.18 on admission labs. Cardiology was consulted for nocturnal bradycardia. She also noted episodes of heart "fluttering" with associated chest pain, labored breathing, dizziness, and near syncope. High-sensitivity troponin flat in the 110s. Not consistent with ACS. Echo showed LVEF of 55-60% with normal wall motion and grade 2 diastolic dysfunction. Multiple short runs of SVT/PAT noted on telemetry. Renal function improved with IV fluids. She was discharged and outpatient monitor was ordered for further evaluation of bradycardia and palpitations.  Patient presents today for follow-up. ***  Sinus Bradycardia Palpitations - Patient recently admitted with acute renal failure and was noted to have some mild bradycardia . However, patient also reported palpitations and she was noted to have short episodes of PAT/SVT on telemetry. - Event monitor showed *** -    Hypertension - BP well controlled. - Previously on Amlodipine and Lisinopril but these were stopped during recent hospitalization due to hypotension and AKI. ***   Hyperlipidemia - *** - Continue Crestor 20mg  daily.   Type 2 Diabetes Mellitus - Hemoglobin A1c 6.4 in 05/2021. - On Metformin at home. - Management per PCP.    CKD Stage III - Recently admitted with acute renal failure with creatinine 6.18 on admission. Felt to be due to hypovolemia and poor PO intake. Improved with fluids. Creatinine 1.46 on discharge.  - ***   Past Medical History:  Diagnosis Date   Acid reflux    Diabetes mellitus without complication (HCC)    pre diabetes   Diverticulitis    Hypertension     No past surgical history on file.  Current Medications: No outpatient medications have been marked as taking for the 07/23/21 encounter (Appointment) with 07/25/21, PA-C.     Allergies:   Chlorhexidine   Social History   Socioeconomic History   Marital status: Single    Spouse name: Not on file   Number of children: Not on file   Years of education: Not on file   Highest education level: Not on file  Occupational History   Not on file  Tobacco Use   Smoking status: Never   Smokeless tobacco: Never  Substance and Sexual Activity   Alcohol use: Yes    Comment: occasional glass of wine   Drug use: Not on file   Sexual activity: Not on file  Other Topics Concern   Not on file  Social History Narrative   Not on file   Social Determinants of Health   Financial Resource Strain: Not on file  Food Insecurity: Not on file  Transportation Needs: Not on file  Physical Activity: Not on file  Stress: Not on file  Social Connections: Not on file     Family History: The patient's family history includes Bowel Disease in her sister; Breast cancer in  her sister; Hyperlipidemia in her father; Hypertension in her father; Lung disease in her mother; Thyroid cancer in her sister.  ROS:   Please see the history of present illness.     EKGs/Labs/Other Studies Reviewed:    The following studies were reviewed today:  Echocardiogram 06/03/2021: Impressions: 1. Left ventricular ejection fraction, by estimation, is 55 to 60%. The  left ventricle has normal function. The left ventricle has no regional  wall motion  abnormalities. Left ventricular diastolic parameters are  consistent with Grade II diastolic  dysfunction (pseudonormalization).   2. Right ventricular systolic function is normal. The right ventricular  size is normal. Tricuspid regurgitation signal is inadequate for assessing  PA pressure.   3. Left atrial size was mildly dilated.   4. The mitral valve is normal in structure. Trivial mitral valve  regurgitation. No evidence of mitral stenosis.   5. The aortic valve is tricuspid. Aortic valve regurgitation is not  visualized. No aortic stenosis is present.   6. The inferior vena cava is dilated in size with >50% respiratory  variability, suggesting right atrial pressure of 8 mmHg.  _______________  Event Monitor ***:  EKG:  EKG not ordered today.   Recent Labs: 06/02/2021: ALT 33 06/03/2021: TSH 1.270 06/04/2021: BUN 33; Creatinine, Ser 1.46; Hemoglobin 8.2; Magnesium 1.8; Platelets 201; Potassium 4.0; Sodium 140  Recent Lipid Panel No results found for: CHOL, TRIG, HDL, CHOLHDL, VLDL, LDLCALC, LDLDIRECT  Physical Exam:    Vital Signs: There were no vitals taken for this visit.    Wt Readings from Last 3 Encounters:  06/04/21 138 lb 0.1 oz (62.6 kg)  01/13/20 106 lb 1.6 oz (48.1 kg)  12/14/19 120 lb (54.4 kg)     General: 70 y.o. female in no acute distress. HEENT: Normocephalic and atraumatic. Sclera clear. EOMs intact. Neck: Supple. No carotid bruits. No JVD. Heart: *** RRR. Distinct S1 and S2. No murmurs, gallops, or rubs. Radial and distal pedal pulses 2+ and equal bilaterally. Lungs: No increased work of breathing. Clear to ausculation bilaterally. No wheezes, rhonchi, or rales.  Abdomen: Soft, non-distended, and non-tender to palpation. Bowel sounds present in all 4 quadrants.  MSK: Normal strength and tone for age. *** Extremities: No lower extremity edema.    Skin: Warm and dry. Neuro: Alert and oriented x3. No focal deficits. Psych: Normal affect. Responds  appropriately.   Assessment:    No diagnosis found.  Plan:     Disposition: Follow up in ***   Medication Adjustments/Labs and Tests Ordered: Current medicines are reviewed at length with the patient today.  Concerns regarding medicines are outlined above.  No orders of the defined types were placed in this encounter.  No orders of the defined types were placed in this encounter.   There are no Patient Instructions on file for this visit.   Signed, Corrin Parker, PA-C  07/15/2021 11:34 AM    [facility]

## 2021-07-21 ENCOUNTER — Encounter: Payer: Self-pay | Admitting: *Deleted

## 2021-07-23 ENCOUNTER — Ambulatory Visit: Payer: Medicare Other | Admitting: Student

## 2021-08-20 ENCOUNTER — Other Ambulatory Visit: Payer: Self-pay | Admitting: Family Medicine

## 2021-08-20 DIAGNOSIS — N2889 Other specified disorders of kidney and ureter: Secondary | ICD-10-CM

## 2021-08-25 ENCOUNTER — Telehealth: Payer: Self-pay

## 2021-08-25 NOTE — Telephone Encounter (Signed)
SCANNED NOTES TO REFERRAL RJ 

## 2021-08-31 DIAGNOSIS — M47816 Spondylosis without myelopathy or radiculopathy, lumbar region: Secondary | ICD-10-CM | POA: Diagnosis not present

## 2021-09-23 ENCOUNTER — Other Ambulatory Visit: Payer: Medicare Other

## 2021-09-24 ENCOUNTER — Ambulatory Visit: Payer: Medicare Other | Admitting: Student

## 2021-10-04 DIAGNOSIS — H9313 Tinnitus, bilateral: Secondary | ICD-10-CM | POA: Diagnosis not present

## 2021-10-04 DIAGNOSIS — Z23 Encounter for immunization: Secondary | ICD-10-CM | POA: Diagnosis not present

## 2021-10-04 DIAGNOSIS — H903 Sensorineural hearing loss, bilateral: Secondary | ICD-10-CM | POA: Diagnosis not present

## 2021-10-04 NOTE — Progress Notes (Deleted)
Cardiology Office Note:    Date:  10/04/2021   ID:  Sabrina Rodriguez, DOB October 18, 1951, MRN [id number]  PCP:  [name], MD  Cardiologist:  None  Electrophysiologist:  None   Referring MD: [name], MD   Chief Complaint: hospital follow-up for bradycardia  History of Present Illness:    Sabrina Rodriguez is a 70 y.o. female with a history of bradycardia, hypertension, type 2 diabetes, GERD, anemia, and chronic back and knee pain who presents today for hospital follow-up of bradycardia.  Patient was admitted from 05/30/2021 to 06/04/2021 for acute renal failure after presenting with generalized weakness and frequent falls. She was found to be hypotensive in the field and was treated with fluids. Creatinine was 6.18 on admission labs. Cardiology was consulted for nocturnal bradycardia. She also noted episodes of heart "fluttering" with associated chest pain, labored breathing, dizziness, and near syncope. High-sensitivity troponin flat in the 110s. Not consistent with ACS. Echo showed LVEF of 55-60% with normal wall motion and grade 2 diastolic dysfunction. Multiple short runs of SVT/PAT noted on telemetry. Renal function improved with IV fluids. She was discharged and outpatient monitor was ordered for further evaluation of bradycardia and palpitations. Monitor showed underlying sinus rhythm with average heart rate of 83 bpm (ranged from 55 to 143 bpm) but no significant arrhythmias.   Patient presents today for follow-up. ***  Sinus Bradycardia Palpitations - Patient recently admitted with acute renal failure and was noted to have some mild bradycardia . However, patient also reported palpitations and she was noted to have short episodes of PAT/SVT on telemetry. - Event monitor showed underlying sinus rhythm with average heart rate of 83 bpm (ranged from 55 to 143 bpm) but no significant arrhythmias.  - ***   Hypertension - BP well controlled. - Previously on Amlodipine and Lisinopril but  these were stopped during recent hospitalization due to hypotension and AKI. ***   Hyperlipidemia - *** - Continue Crestor 20mg  daily.   Type 2 Diabetes Mellitus - Hemoglobin A1c 6.4 in 05/2021. - On Metformin at home. - Management per PCP.   Recent AKI - Recently admitted with acute renal failure with creatinine 6.18 on admission. Felt to be due to hypovolemia and poor PO intake. Improved with fluids. Creatinine 1.46 on discharge. Baseline prior to this 0.7 to 0.9. - ***  Past Medical History:  Diagnosis Date   Acid reflux    Diabetes mellitus without complication (HCC)    pre diabetes   Diverticulitis    Hypertension     No past surgical history on file.  Current Medications: No outpatient medications have been marked as taking for the 10/15/21 encounter (Appointment) with 10/17/21, PA-C.     Allergies:   Chlorhexidine   Social History   Socioeconomic History   Marital status: Single    Spouse name: Not on file   Number of children: Not on file   Years of education: Not on file   Highest education level: Not on file  Occupational History   Not on file  Tobacco Use   Smoking status: Never   Smokeless tobacco: Never  Substance and Sexual Activity   Alcohol use: Yes    Comment: occasional glass of wine   Drug use: Not on file   Sexual activity: Not on file  Other Topics Concern   Not on file  Social History Narrative   Not on file   Social Determinants of Health   Financial Resource Strain: Not on file  Food Insecurity: Not on file  Transportation Needs: Not on file  Physical Activity: Not on file  Stress: Not on file  Social Connections: Not on file     Family History: The patient's family history includes Bowel Disease in her sister; Breast cancer in her sister; Hyperlipidemia in her father; Hypertension in her father; Lung disease in her mother; Thyroid cancer in her sister.  ROS:   Please see the history of present illness.      EKGs/Labs/Other Studies Reviewed:    The following studies were reviewed today:  Echocardiogram 06/03/2021: Impressions: 1. Left ventricular ejection fraction, by estimation, is 55 to 60%. The  left ventricle has normal function. The left ventricle has no regional  wall motion abnormalities. Left ventricular diastolic parameters are  consistent with Grade II diastolic  dysfunction (pseudonormalization).   2. Right ventricular systolic function is normal. The right ventricular  size is normal. Tricuspid regurgitation signal is inadequate for assessing  PA pressure.   3. Left atrial size was mildly dilated.   4. The mitral valve is normal in structure. Trivial mitral valve  regurgitation. No evidence of mitral stenosis.   5. The aortic valve is tricuspid. Aortic valve regurgitation is not  visualized. No aortic stenosis is present.   6. The inferior vena cava is dilated in size with >50% respiratory  variability, suggesting right atrial pressure of 8 mmHg.  _______________  Event Monitor 06/15/2021 to 07/14/2021: Sinus bradycardia, NSR, sinus tachycardia.  EKG:  EKG ordered today.   Recent Labs: 06/02/2021: ALT 33 06/03/2021: TSH 1.270 06/04/2021: BUN 33; Creatinine, Ser 1.46; Hemoglobin 8.2; Magnesium 1.8; Platelets 201; Potassium 4.0; Sodium 140  Recent Lipid Panel No results found for: CHOL, TRIG, HDL, CHOLHDL, VLDL, LDLCALC, LDLDIRECT  Physical Exam:    Vital Signs: There were no vitals taken for this visit.    Wt Readings from Last 3 Encounters:  06/04/21 138 lb 0.1 oz (62.6 kg)  01/13/20 106 lb 1.6 oz (48.1 kg)  12/14/19 120 lb (54.4 kg)     General: 70 y.o. female in no acute distress. HEENT: Normocephalic and atraumatic. Sclera clear. EOMs intact. Neck: Supple. No carotid bruits. No JVD. Heart: *** RRR. Distinct S1 and S2. No murmurs, gallops, or rubs. Radial and distal pedal pulses 2+ and equal bilaterally. Lungs: No increased work of breathing. Clear to ausculation  bilaterally. No wheezes, rhonchi, or rales.  Abdomen: Soft, non-distended, and non-tender to palpation. Bowel sounds present in all 4 quadrants.  MSK: Normal strength and tone for age. *** Extremities: No lower extremity edema.    Skin: Warm and dry. Neuro: Alert and oriented x3. No focal deficits. Psych: Normal affect. Responds appropriately.   Assessment:    No diagnosis found.  Plan:     Disposition: Follow up in ***   Medication Adjustments/Labs and Tests Ordered: Current medicines are reviewed at length with the patient today.  Concerns regarding medicines are outlined above.  No orders of the defined types were placed in this encounter.  No orders of the defined types were placed in this encounter.   There are no Patient Instructions on file for this visit.   Signed, Darreld Mclean, PA-C  10/04/2021 12:53 PM    [facility]

## 2021-10-08 ENCOUNTER — Other Ambulatory Visit: Payer: Self-pay

## 2021-10-08 ENCOUNTER — Ambulatory Visit
Admission: RE | Admit: 2021-10-08 | Discharge: 2021-10-08 | Disposition: A | Discharge status: Home / Self Care | Payer: Medicare Other | Source: Ambulatory Visit | Attending: Family Medicine | Admitting: Family Medicine

## 2021-10-08 DIAGNOSIS — K862 Cyst of pancreas: Secondary | ICD-10-CM | POA: Diagnosis not present

## 2021-10-08 DIAGNOSIS — N2889 Other specified disorders of kidney and ureter: Secondary | ICD-10-CM

## 2021-10-08 DIAGNOSIS — M419 Scoliosis, unspecified: Secondary | ICD-10-CM | POA: Diagnosis not present

## 2021-10-08 DIAGNOSIS — K5730 Diverticulosis of large intestine without perforation or abscess without bleeding: Secondary | ICD-10-CM | POA: Diagnosis not present

## 2021-10-08 IMAGING — MR MR ABDOMEN WO/W CM
15 of 17 series · 41 of 48 positions shown · IV contrast (multihance)
Comparison: MRI abdomen [DATE]

CLINICAL DATA: Left kidney mass

EXAM:
MRI ABDOMEN WITHOUT AND WITH CONTRAST
TECHNIQUE: Multiplanar multisequence MR imaging of the abdomen was performed
both before and after the administration of intravenous contrast.
CONTRAST:  12mL MULTIHANCE GADOBENATE DIMEGLUMINE 529 MG/ML IV SOLN

[Series 3: T2 · coronal · 5.0mm · 0.78mm/px · 2 of 18 slices shown (1 of 3)]
[im 1/18]
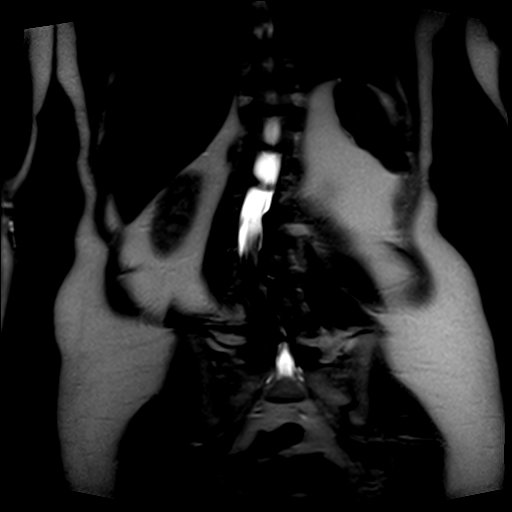
[im 18/18]
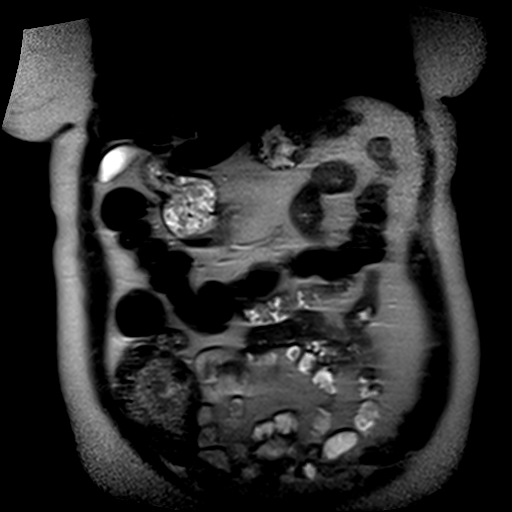

[Series 4: T2 · axial · 5.0mm · 0.66mm/px · z∈[-46,+98]mm · 2 of 25 slices shown (2 of 3)]
[im 1/25]
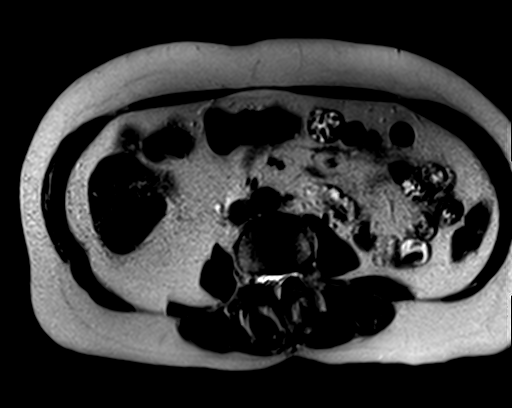
[im 25/25]
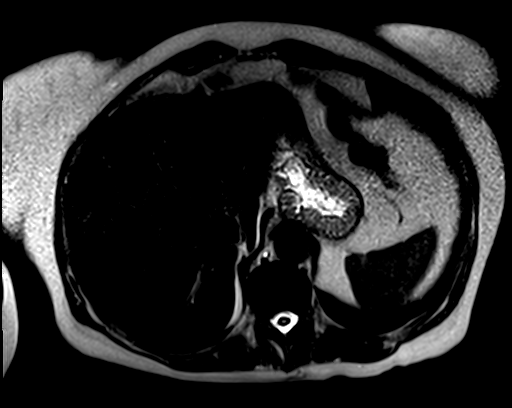

[Series 5: bSSFP · axial · 4.0mm · 0.74mm/px · z∈[-58,+90]mm · 3 of 38 slices shown]
[im 1/38]
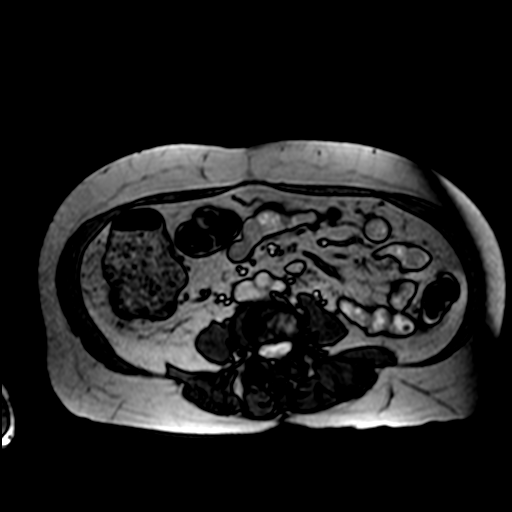
[im 19/38]
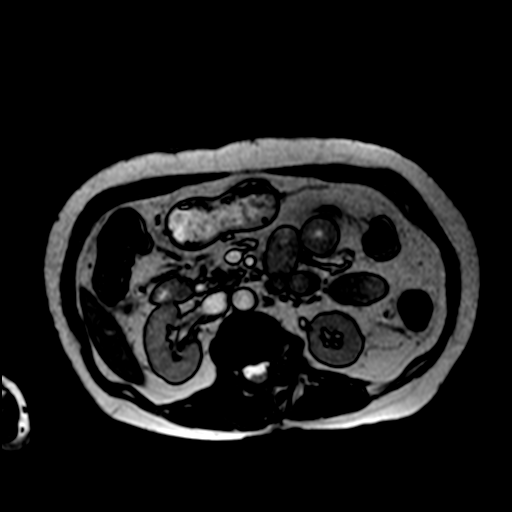
[im 38/38]
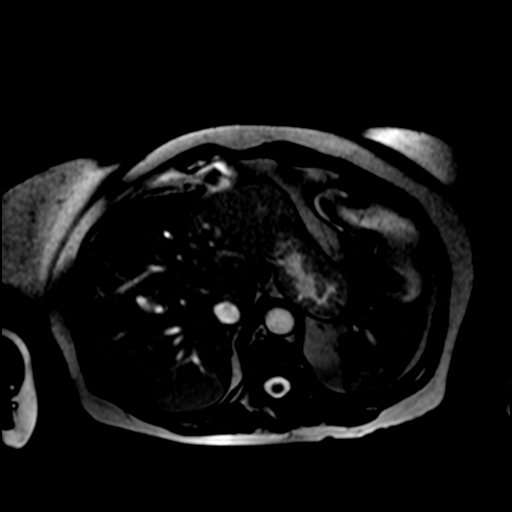

[Series 6: T1 · axial · 5.0mm · 0.66mm/px · z∈[-45,+93]mm · 4 of 48 slices shown]
[im 1/48]
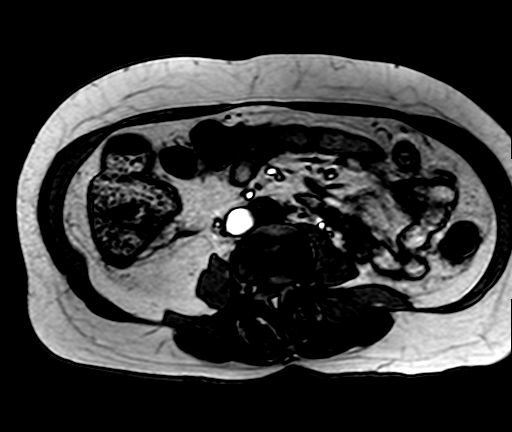
[im 16/48]
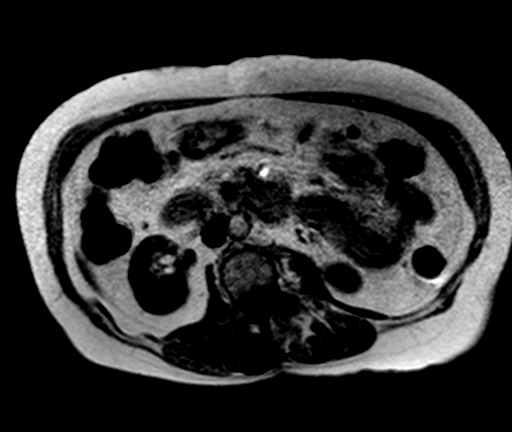
[im 32/48]
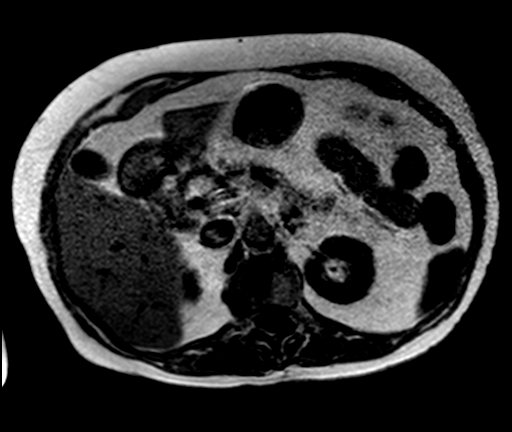
[im 48/48]
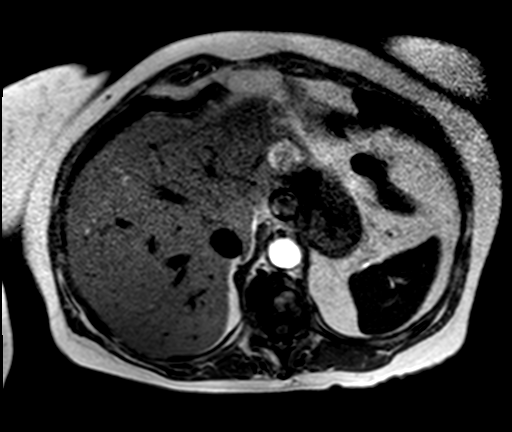

[Series 7: ep2d_diff_b50_500_800_p2_trig · axial · 6.0mm · 1.98mm/px · z∈[-40,+116]mm · 5 of 63 slices shown]
[im 1/63]
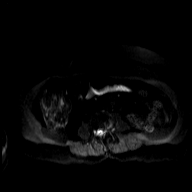
[im 16/63]
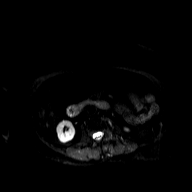
[im 32/63]
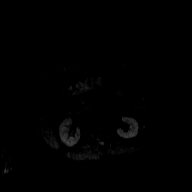
[im 47/63]
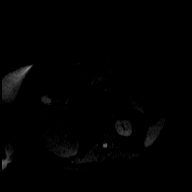
[im 63/63]
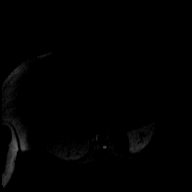

[Series 8: ep2d_diff_b50_500_800_p2_trig_adc · axial · 6.0mm · 1.98mm/px · 1 of 21 slices shown]
[im 1/21]
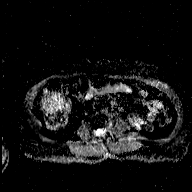

[Series 9: T2 · axial · 5.0mm · 1.33mm/px · 1 of 25 slices shown (3 of 3)]
[im 1/25]
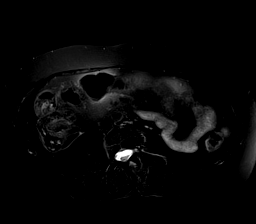

[Series 10: T1 dynamic · axial · non-contrast · 2.3mm · 1.41mm/px · z∈[-36,+100]mm · 3 of 60 slices shown]
[im 1/60]
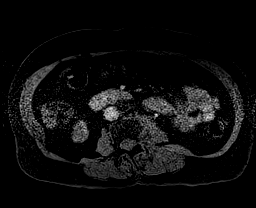
[im 30/60]
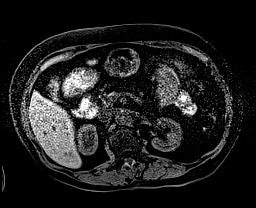
[im 60/60]
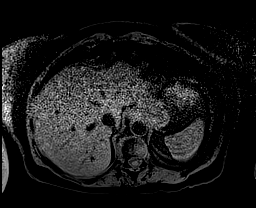

[Series 11: post 25 sec · axial · 2.3mm · 1.41mm/px · z∈[-36,+100]mm · 3 of 60 slices shown]
[im 1/60]
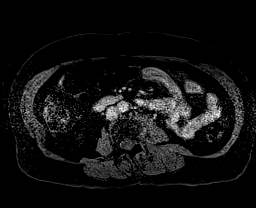
[im 30/60]
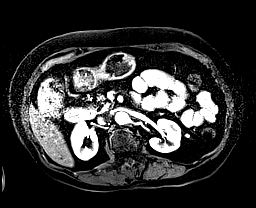
[im 60/60]
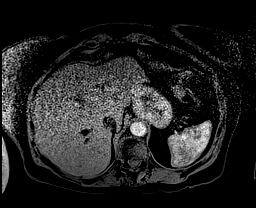

[Series 12: post 25 sec_sub · axial · 2.3mm · 1.41mm/px · z∈[-36,+100]mm · 3 of 60 slices shown]
[im 1/60]
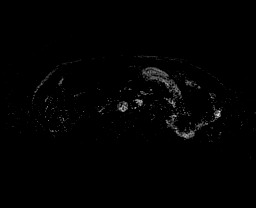
[im 30/60]
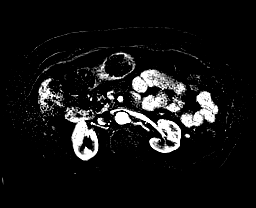
[im 60/60]
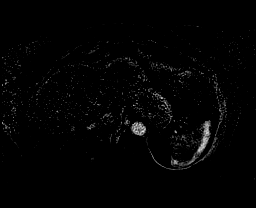

[Series 13: post 45 sec · axial · 2.3mm · 1.41mm/px · z∈[-36,+100]mm · 3 of 60 slices shown]
[im 1/60]
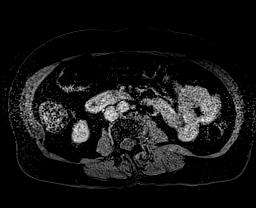
[im 30/60]
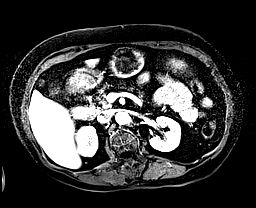
[im 60/60]
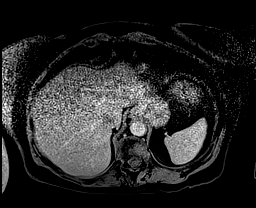

[Series 14: post 45 sec_sub · axial · 2.3mm · 1.41mm/px · z∈[-36,+100]mm · 3 of 60 slices shown]
[im 1/60]
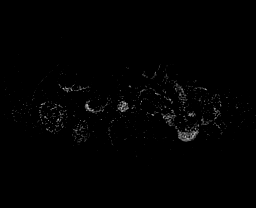
[im 30/60]
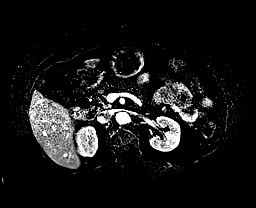
[im 60/60]
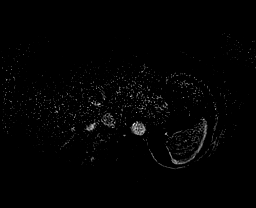

[Series 15: post 90 sec · axial · 2.3mm · 1.41mm/px · z∈[-36,+100]mm · 3 of 60 slices shown]
[im 1/60]
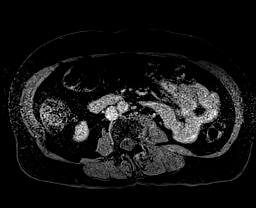
[im 30/60]
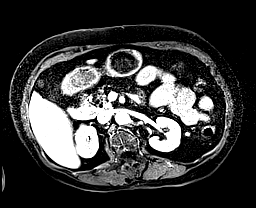
[im 60/60]
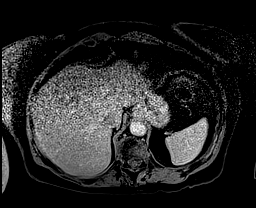

[Series 16: post 90 sec_sub · axial · 2.3mm · 1.41mm/px · z∈[-36,+100]mm · 3 of 60 slices shown]
[im 1/60]
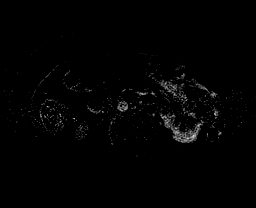
[im 30/60]
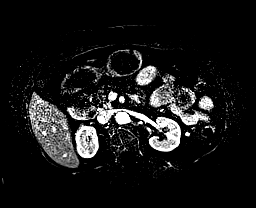
[im 60/60]
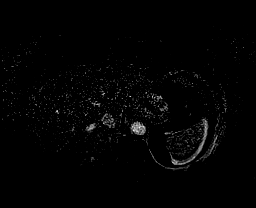

[Series 17: T1 dynamic post-contrast · coronal · 2.6mm · 0.78mm/px · 2 of 52 slices shown]
[im 1/52]
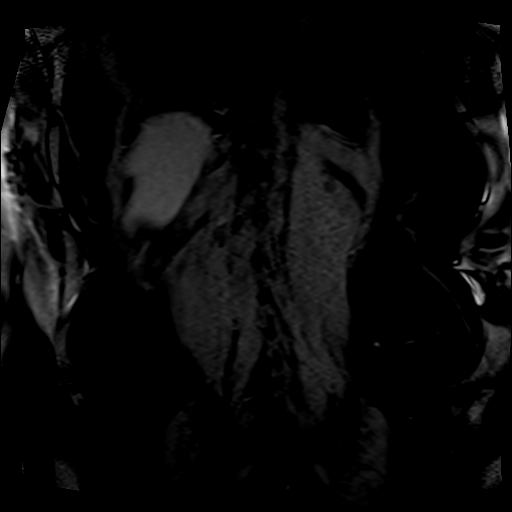
[im 26/52]
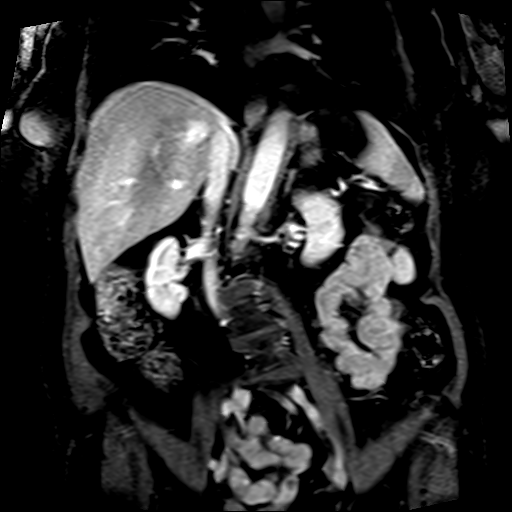

[41 of 48 positions shown; findings below may reference images not displayed]

FINDINGS: Lower chest: Not visualized

Hepatobiliary: Liver is incompletely visualized. No evidence of
hepatic steatosis. No suspicious hepatic mass visualized.
Gallbladder appears normal. No biliary ductal dilatation identified.

Pancreas: Severely atrophic. Several well-defined hyperintense T2
signal nonenhancing cystic lesions are again seen scattered
throughout the pancreas which measure up to 1.3 cm in the uncinate
process and 0.7 cm in the tail. Several of the lesions appear to
communicate with the main pancreatic duct. The main pancreatic duct
is normal caliber.

Spleen:  Within normal limits in size and appearance.

Adrenals/Urinary Tract: Adrenal glands appear normal.
Redemonstration of a 9 mm hyperintense T1 signal lesion in the mid
left kidney which does not appear to enhance on postcontrast
subtraction sequences, likely a hemorrhagic cyst. No hydronephrosis.

Stomach/Bowel: Mild colonic diverticulosis. No evidence of bowel
obstruction.

Vascular/Lymphatic:  No bulky lymphadenopathy.

Other:  No ascites.

Musculoskeletal: S shaped scoliosis of the spine. No suspicious bony
lesions appreciated.
IMPRESSION: 1. 9 mm lesion in the left kidney is consistent with a hemorrhagic
cyst.
2. Pancreatic atrophy with multiple stable small cystic lesions
suggestive of side branch IPMN. Recommend continued follow-up with
MRI in 24 months.

## 2021-10-08 MED ORDER — GADOBENATE DIMEGLUMINE 529 MG/ML IV SOLN
12.0000 mL | Freq: Once | INTRAVENOUS | Status: AC | PRN
  Administered 2021-10-08: 12 mL via INTRAVENOUS

## 2021-10-14 ENCOUNTER — Encounter: Payer: Self-pay | Admitting: Student

## 2021-10-15 ENCOUNTER — Ambulatory Visit: Payer: Medicare Other | Admitting: Student

## 2021-10-15 ENCOUNTER — Other Ambulatory Visit: Payer: Medicare Other

## 2021-11-01 IMAGING — CR DG ABDOMEN 2V
2 series · 2 of 2 positions shown · non-contrast
Comparison: None.

CLINICAL DATA: Vomiting

EXAM:
ABDOMEN - 2 VIEW

[abdomen erect]
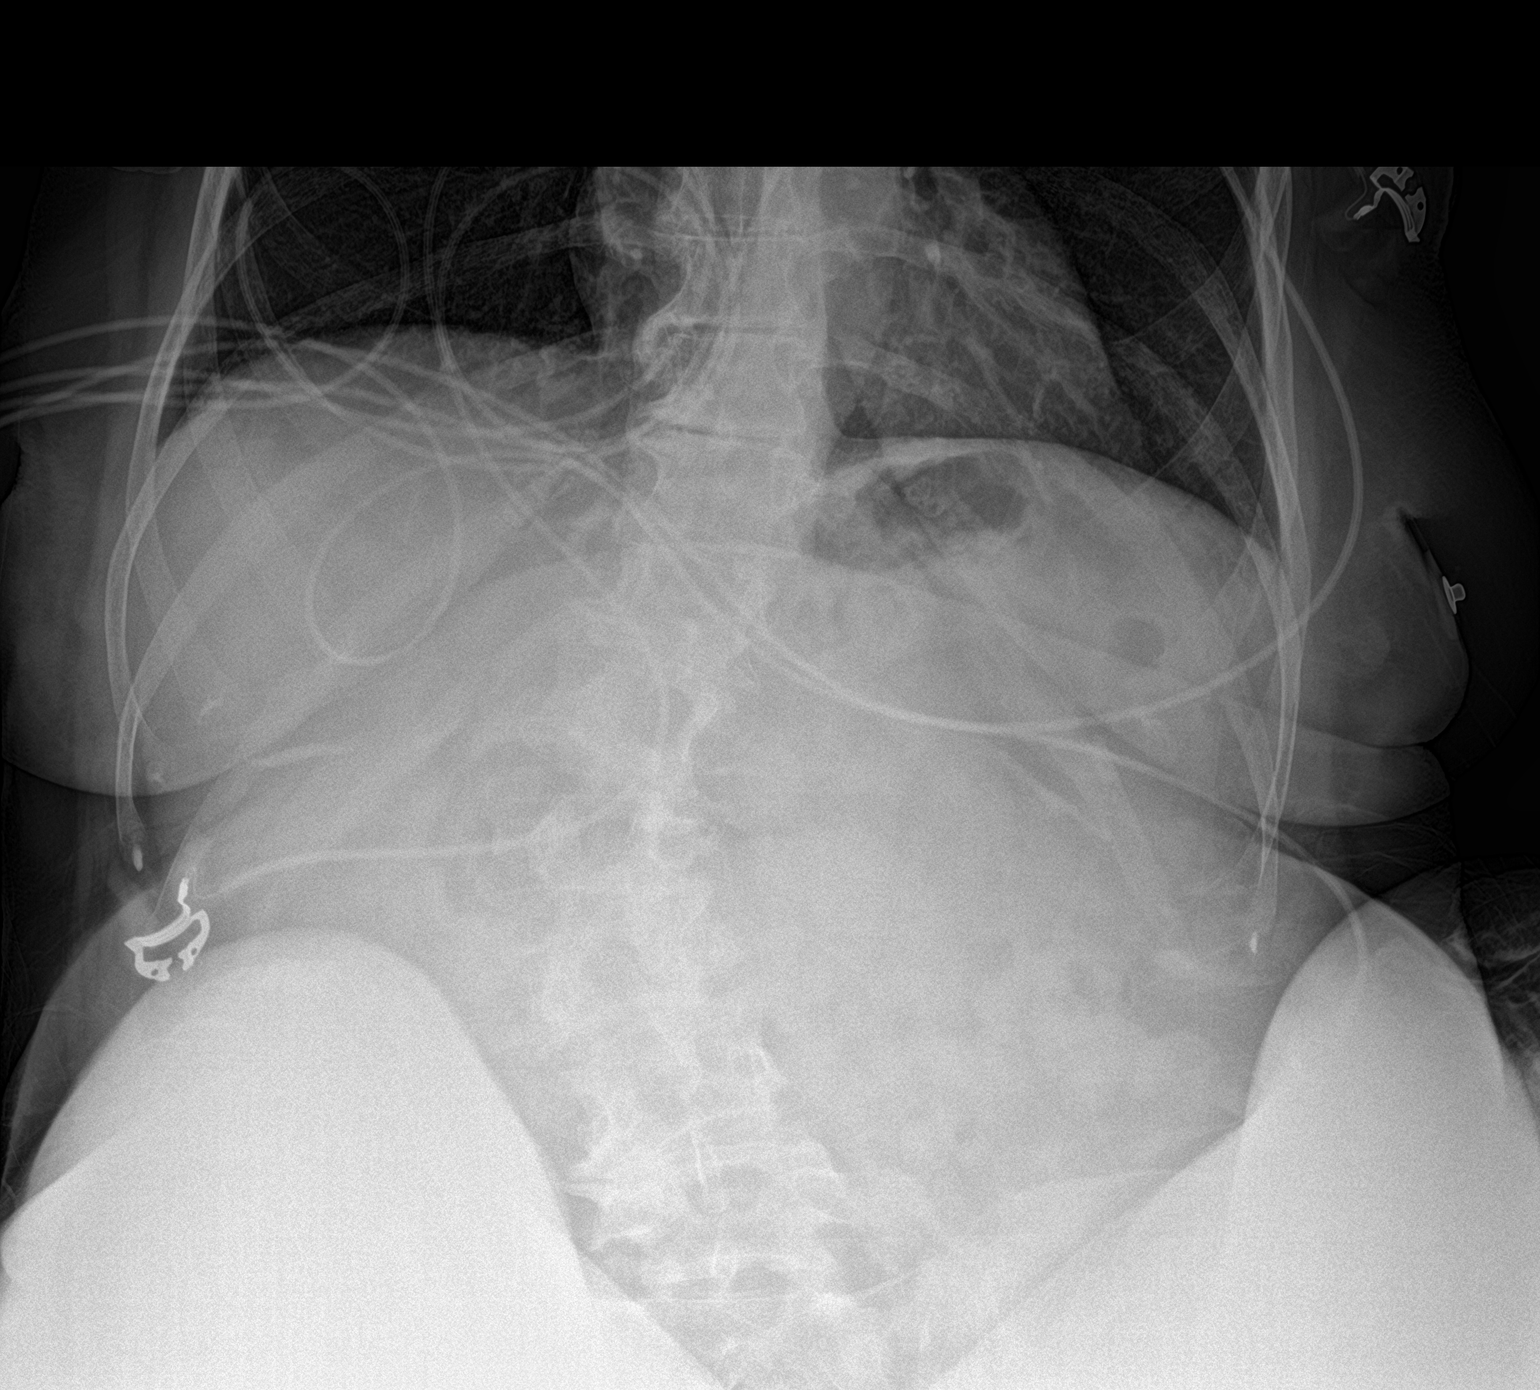

[abdomen supine]
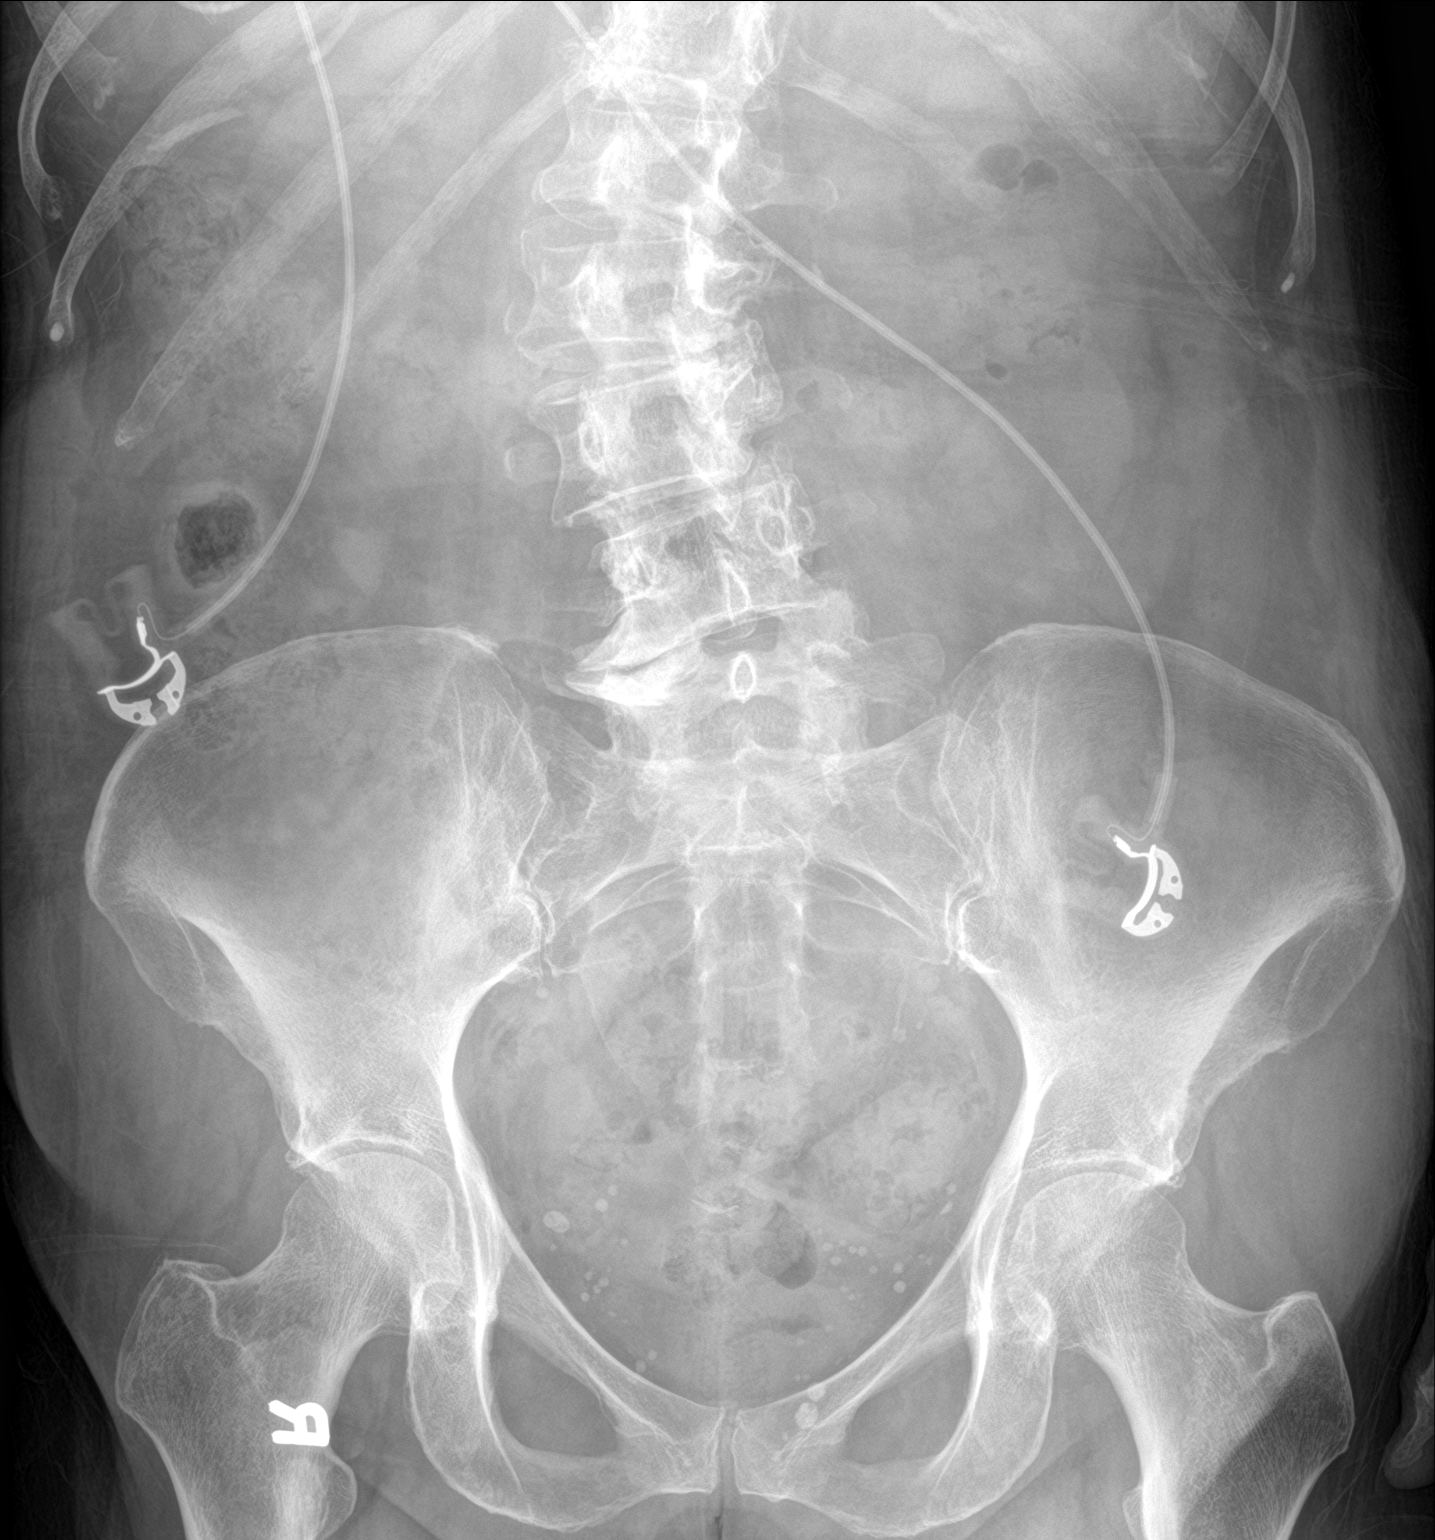

[2 of 2 positions shown; findings below may reference images not displayed]

FINDINGS: The bowel gas pattern is normal. There is no evidence of free air.
No radio-opaque calculi or other significant radiographic
abnormality is seen.
IMPRESSION: Nonobstructive pattern of bowel gas.  No free air in the abdomen.

## 2021-11-25 DIAGNOSIS — N3281 Overactive bladder: Secondary | ICD-10-CM | POA: Diagnosis not present

## 2021-11-25 DIAGNOSIS — R300 Dysuria: Secondary | ICD-10-CM | POA: Diagnosis not present

## 2021-11-25 DIAGNOSIS — M25512 Pain in left shoulder: Secondary | ICD-10-CM | POA: Diagnosis not present

## 2022-01-11 DIAGNOSIS — Z7984 Long term (current) use of oral hypoglycemic drugs: Secondary | ICD-10-CM | POA: Diagnosis not present

## 2022-01-11 DIAGNOSIS — E7800 Pure hypercholesterolemia, unspecified: Secondary | ICD-10-CM | POA: Diagnosis not present

## 2022-01-11 DIAGNOSIS — I10 Essential (primary) hypertension: Secondary | ICD-10-CM | POA: Diagnosis not present

## 2022-01-11 DIAGNOSIS — N1832 Chronic kidney disease, stage 3b: Secondary | ICD-10-CM | POA: Diagnosis not present

## 2022-01-11 DIAGNOSIS — D508 Other iron deficiency anemias: Secondary | ICD-10-CM | POA: Diagnosis not present

## 2022-01-11 DIAGNOSIS — E1169 Type 2 diabetes mellitus with other specified complication: Secondary | ICD-10-CM | POA: Diagnosis not present

## 2022-03-21 DIAGNOSIS — M79672 Pain in left foot: Secondary | ICD-10-CM | POA: Diagnosis not present

## 2022-03-21 DIAGNOSIS — R5383 Other fatigue: Secondary | ICD-10-CM | POA: Diagnosis not present

## 2022-04-26 DIAGNOSIS — I10 Essential (primary) hypertension: Secondary | ICD-10-CM | POA: Diagnosis not present

## 2022-05-18 DIAGNOSIS — H9313 Tinnitus, bilateral: Secondary | ICD-10-CM | POA: Diagnosis not present

## 2022-05-18 DIAGNOSIS — H903 Sensorineural hearing loss, bilateral: Secondary | ICD-10-CM | POA: Diagnosis not present

## 2022-07-05 DIAGNOSIS — D500 Iron deficiency anemia secondary to blood loss (chronic): Secondary | ICD-10-CM | POA: Diagnosis not present

## 2022-07-05 DIAGNOSIS — Z0000 Encounter for general adult medical examination without abnormal findings: Secondary | ICD-10-CM | POA: Diagnosis not present

## 2022-07-05 DIAGNOSIS — N3281 Overactive bladder: Secondary | ICD-10-CM | POA: Diagnosis not present

## 2022-07-05 DIAGNOSIS — N1832 Chronic kidney disease, stage 3b: Secondary | ICD-10-CM | POA: Diagnosis not present

## 2022-07-05 DIAGNOSIS — E7800 Pure hypercholesterolemia, unspecified: Secondary | ICD-10-CM | POA: Diagnosis not present

## 2022-07-05 DIAGNOSIS — I10 Essential (primary) hypertension: Secondary | ICD-10-CM | POA: Diagnosis not present

## 2022-07-05 DIAGNOSIS — R609 Edema, unspecified: Secondary | ICD-10-CM | POA: Diagnosis not present

## 2022-07-05 DIAGNOSIS — E1169 Type 2 diabetes mellitus with other specified complication: Secondary | ICD-10-CM | POA: Diagnosis not present

## 2022-07-05 DIAGNOSIS — Z1211 Encounter for screening for malignant neoplasm of colon: Secondary | ICD-10-CM | POA: Diagnosis not present

## 2022-07-05 DIAGNOSIS — K219 Gastro-esophageal reflux disease without esophagitis: Secondary | ICD-10-CM | POA: Diagnosis not present

## 2022-08-16 DIAGNOSIS — R490 Dysphonia: Secondary | ICD-10-CM | POA: Diagnosis not present

## 2022-08-16 DIAGNOSIS — M25361 Other instability, right knee: Secondary | ICD-10-CM | POA: Diagnosis not present

## 2022-08-16 DIAGNOSIS — K219 Gastro-esophageal reflux disease without esophagitis: Secondary | ICD-10-CM | POA: Diagnosis not present

## 2022-08-16 DIAGNOSIS — H9313 Tinnitus, bilateral: Secondary | ICD-10-CM | POA: Diagnosis not present

## 2022-09-26 DIAGNOSIS — Z9181 History of falling: Secondary | ICD-10-CM | POA: Diagnosis not present

## 2022-09-26 DIAGNOSIS — T07XXXA Unspecified multiple injuries, initial encounter: Secondary | ICD-10-CM | POA: Diagnosis not present

## 2022-10-04 ENCOUNTER — Ambulatory Visit
Admission: EM | Admit: 2022-10-04 | Discharge: 2022-10-04 | Disposition: A | Discharge status: Home / Self Care | Payer: Medicare Other | Attending: Physician Assistant | Admitting: Physician Assistant

## 2022-10-04 ENCOUNTER — Ambulatory Visit (INDEPENDENT_AMBULATORY_CARE_PROVIDER_SITE_OTHER): Payer: Medicare Other

## 2022-10-04 DIAGNOSIS — M25512 Pain in left shoulder: Secondary | ICD-10-CM

## 2022-10-04 DIAGNOSIS — M25521 Pain in right elbow: Secondary | ICD-10-CM | POA: Diagnosis not present

## 2022-10-04 DIAGNOSIS — M25561 Pain in right knee: Secondary | ICD-10-CM

## 2022-10-04 DIAGNOSIS — M25562 Pain in left knee: Secondary | ICD-10-CM

## 2022-10-04 DIAGNOSIS — M25511 Pain in right shoulder: Secondary | ICD-10-CM

## 2022-10-04 DIAGNOSIS — M25531 Pain in right wrist: Secondary | ICD-10-CM | POA: Diagnosis not present

## 2022-10-04 DIAGNOSIS — M25461 Effusion, right knee: Secondary | ICD-10-CM | POA: Diagnosis not present

## 2022-10-04 DIAGNOSIS — W19XXXA Unspecified fall, initial encounter: Secondary | ICD-10-CM

## 2022-10-04 DIAGNOSIS — M898X1 Other specified disorders of bone, shoulder: Secondary | ICD-10-CM

## 2022-10-04 DIAGNOSIS — M1711 Unilateral primary osteoarthritis, right knee: Secondary | ICD-10-CM | POA: Diagnosis not present

## 2022-10-04 DIAGNOSIS — Z043 Encounter for examination and observation following other accident: Secondary | ICD-10-CM | POA: Diagnosis not present

## 2022-10-04 NOTE — ED Provider Notes (Addendum)
[facility]    CSN: [id number] Arrival date & time: 10/04/22  1039      History   Chief Complaint Chief Complaint  Patient presents with   Fall    HPI Sabrina Rodriguez is a 71 y.o. female.   Patient here today for evaluation of multiple injuries after a fall this morning.  She reports that she is not sure what caused her fall but that when she fell she fell onto her right arm.  She states she has had pain in her right elbow and right shoulder since the fall.  She also reports some pain in her left clavicle and bilateral knees with weightbearing.  She does not report any numbness or tingling.  She denies head injury or loss of consciousness. She has not had any treatment for symptoms.   The history is provided by the patient.    Past Medical History:  Diagnosis Date   Acid reflux    Diverticulitis    Hyperlipidemia    Hypertension    Type 2 diabetes mellitus     Patient Active Problem List   Diagnosis Date Noted   Acute renal failure (ARF) (HCC) 05/30/2021   Normocytic anemia 05/30/2021   Dehydration    Protein-calorie malnutrition, severe 01/14/2020   AKI (acute kidney injury) (HCC) 01/12/2020   Diabetes mellitus type II, non insulin dependent (HCC) 12/13/2019   Hypertension     History reviewed. No pertinent surgical history.  OB History   No obstetric history on file.      Home Medications    Prior to Admission medications   Medication Sig Start Date End Date Taking? Authorizing Provider  acetaminophen (TYLENOL) 325 MG tablet Take 2 tablets (650 mg total) by mouth every 6 (six) hours as needed for mild pain (or Fever >/= 101). 06/04/21   Calvert Cantor, MD  DULoxetine (CYMBALTA) 30 MG capsule Take 3 capsules (90 mg total) by mouth daily. 06/04/21   Calvert Cantor, MD  feeding supplement (ENSURE ENLIVE / ENSURE PLUS) LIQD Take 237 mLs by mouth 3 (three) times daily between meals. 06/04/21   Calvert Cantor, MD  Iron, Ferrous Sulfate, 325 (65 Fe) MG TABS Take  325 mg by mouth 2 (two) times daily. 12/17/19   Alwyn Ren, MD  metFORMIN (GLUCOPHAGE) 500 MG tablet Take 500 mg by mouth at bedtime. 10/07/19   [provider]  Multiple Vitamin (MULTIVITAMIN WITH MINERALS) TABS tablet Take 1 tablet by mouth daily. 01/17/20   Mikhail, Nita Sells, DO  omeprazole (PRILOSEC) 40 MG capsule Take 40 mg by mouth daily. 10/07/19   [provider]  psyllium (HYDROCIL/METAMUCIL) 95 % PACK Take 1 packet by mouth 2 (two) times daily. 12/17/19   Alwyn Ren, MD  rosuvastatin (CRESTOR) 20 MG tablet Take 20 mg by mouth daily. 10/08/19   [provider]    Family History Family History  Problem Relation Age of Onset   Lung disease Mother    Hypertension Father    Hyperlipidemia Father    Bowel Disease Sister    Breast cancer Sister    Thyroid cancer Sister     Social History Social History   Tobacco Use   Smoking status: Never   Smokeless tobacco: Never  Substance Use Topics   Alcohol use: Yes    Comment: occasional glass of wine     Allergies   Chlorhexidine   Review of Systems Review of Systems  Constitutional:  Negative for chills and fever.  Eyes:  Negative for discharge and redness.  Respiratory:  Negative for shortness of breath.   Gastrointestinal:  Negative for nausea and vomiting.  Musculoskeletal:  Positive for arthralgias, joint swelling and myalgias.  Neurological:  Negative for numbness.     Physical Exam Triage Vital Signs ED Triage Vitals  Enc Vitals Group     BP      Pulse      Resp      Temp      Temp src      SpO2      Weight      Height      Head Circumference      Peak Flow      Pain Score      Pain Loc      Pain Edu?      Excl. in GC?    No data found.  Updated Vital Signs Pulse 88   Temp 98 F (36.7 C) (Oral)   Resp 17   SpO2 98%   Physical Exam Vitals and nursing note reviewed.  Constitutional:      General: She is not in acute distress.    Appearance: Normal  appearance. She is not ill-appearing.  HENT:     Head: Normocephalic and atraumatic.  Eyes:     Conjunctiva/sclera: Conjunctivae normal.  Cardiovascular:     Rate and Rhythm: Normal rate.  Pulmonary:     Effort: Pulmonary effort is normal. No respiratory distress.  Musculoskeletal:     Comments: Diffuse swelling appreciated to right elbow, decreased ROM of same, TTP to same, Decreased ROM of right shoulder with pain noted to posterior scapula and AC joint, TTP noted to left clavicle without palpated abnormality. Patient is able to bear weight with pain in bilateral knees, no apparent swelling appreciated of bilateral knees.   Neurological:     Mental Status: She is alert.  Psychiatric:        Mood and Affect: Mood normal.        Behavior: Behavior normal.        Thought Content: Thought content normal.      UC Treatments / Results  Labs (all labs ordered are listed, but only abnormal results are displayed) Labs Reviewed - No data to display  EKG   Radiology DG Knee Complete 4 Views Left  Result Date: 10/04/2022 CLINICAL DATA:  Fall EXAM: LEFT KNEE - COMPLETE 4+ VIEW COMPARISON:  None Available. FINDINGS: Negative for fracture Advanced degenerative change in the knee with joint space narrowing and spurring most severe in the medial joint space and patellofemoral joint. Moderate joint effusion IMPRESSION: Negative for fracture.  Advanced degenerative change Electronically Signed   By: Marlan Palau M.D.   On: 10/04/2022 13:18   DG Knee Complete 4 Views Right  Result Date: 10/04/2022 CLINICAL DATA:  Fall EXAM: RIGHT KNEE - COMPLETE 4+ VIEW COMPARISON:  None Available. FINDINGS: Negative for fracture Advanced degenerative change with joint space narrowing and spurring most notably medially. Mild to moderate joint effusion. Fabella. IMPRESSION: Advanced degenerative change with joint effusion. Negative for fracture. Electronically Signed   By: Marlan Palau M.D.   On: 10/04/2022  13:17   DG Wrist Complete Right  Result Date: 10/04/2022 CLINICAL DATA:  Fall EXAM: RIGHT WRIST - COMPLETE 3+ VIEW COMPARISON:  None Available. FINDINGS: Normal alignment.  No acute fracture Advanced degenerative change base of thumb IMPRESSION: IMPRESSION Negative for fracture Electronically Signed   By: Marlan Palau M.D.   On: 10/04/2022 13:17  DG Elbow Complete Right  Result Date: 10/04/2022 CLINICAL DATA:  Fall EXAM: RIGHT ELBOW - COMPLETE 3+ VIEW COMPARISON:  None Available. FINDINGS: Normal alignment.  No acute fracture Mild degenerative change in the elbow. Mild spurring of the olecranon and medial humeral condyle. IMPRESSION: Negative for fracture Electronically Signed   By: Franchot Gallo M.D.   On: 10/04/2022 13:16   DG Shoulder Right  Result Date: 10/04/2022 CLINICAL DATA:  Fall EXAM: RIGHT SHOULDER - 2+ VIEW COMPARISON:  None Available. FINDINGS: There is no evidence of fracture or dislocation. There is no evidence of arthropathy or other focal bone abnormality. Soft tissues are unremarkable. IMPRESSION: Negative. Electronically Signed   By: Franchot Gallo M.D.   On: 10/04/2022 13:15   DG Clavicle Left  Result Date: 10/04/2022 CLINICAL DATA:  Fall EXAM: LEFT CLAVICLE - 2+ VIEWS COMPARISON:  None Available. FINDINGS: There is no evidence of fracture or other focal bone lesions. Soft tissues are unremarkable. IMPRESSION: Negative. Electronically Signed   By: Franchot Gallo M.D.   On: 10/04/2022 13:14    Procedures Procedures (including critical care time)  Medications Ordered in UC Medications - No data to display  Initial Impression / Assessment and Plan / UC Course  I have reviewed the triage vital signs and the nursing notes.  Pertinent labs & imaging results that were available during my care of the patient were reviewed by me and considered in my medical decision making (see chart for details).     No fractures noted on xray. Recommend further evaluation by ortho if  no gradual improvement or with any further concerns. Patient expresses understanding.    Final Clinical Impressions(s) / UC Diagnoses   Final diagnoses:  Fall, initial encounter  Right elbow pain  Acute pain of right shoulder  Acute pain of both knees  Pain of left clavicle     Discharge Instructions       Please take ibuprofen and tylenol as needed for pain relief.   If pain does not improve over the next few days/week or any new symptoms develop recommend further evaluation by ortho.      ED Prescriptions   None    PDMP not reviewed this encounter.   Francene Finders, PA-C 10/04/22 1401    Francene Finders, PA-C 10/04/22 1404

## 2022-10-04 NOTE — ED Triage Notes (Signed)
Pt presents with right elbow injury, left knee injury, and left shoulder injury after a fall off side walk this morning; areas are swollen and discolored.

## 2022-10-04 NOTE — Discharge Instructions (Signed)
  Please take ibuprofen and tylenol as needed for pain relief.   If pain does not improve over the next few days/week or any new symptoms develop recommend further evaluation by ortho.

## 2022-12-22 DIAGNOSIS — N3281 Overactive bladder: Secondary | ICD-10-CM | POA: Diagnosis not present

## 2022-12-22 DIAGNOSIS — M79604 Pain in right leg: Secondary | ICD-10-CM | POA: Diagnosis not present

## 2022-12-22 DIAGNOSIS — E1122 Type 2 diabetes mellitus with diabetic chronic kidney disease: Secondary | ICD-10-CM | POA: Diagnosis not present

## 2022-12-22 DIAGNOSIS — E7800 Pure hypercholesterolemia, unspecified: Secondary | ICD-10-CM | POA: Diagnosis not present

## 2022-12-22 DIAGNOSIS — N1832 Chronic kidney disease, stage 3b: Secondary | ICD-10-CM | POA: Diagnosis not present

## 2022-12-22 DIAGNOSIS — I10 Essential (primary) hypertension: Secondary | ICD-10-CM | POA: Diagnosis not present

## 2022-12-22 DIAGNOSIS — E1169 Type 2 diabetes mellitus with other specified complication: Secondary | ICD-10-CM | POA: Diagnosis not present

## 2022-12-22 DIAGNOSIS — M79605 Pain in left leg: Secondary | ICD-10-CM | POA: Diagnosis not present

## 2023-01-10 DIAGNOSIS — L989 Disorder of the skin and subcutaneous tissue, unspecified: Secondary | ICD-10-CM | POA: Diagnosis not present

## 2023-08-23 DIAGNOSIS — E1169 Type 2 diabetes mellitus with other specified complication: Secondary | ICD-10-CM | POA: Diagnosis not present

## 2023-08-23 DIAGNOSIS — E46 Unspecified protein-calorie malnutrition: Secondary | ICD-10-CM | POA: Diagnosis not present

## 2023-08-23 DIAGNOSIS — K5792 Diverticulitis of intestine, part unspecified, without perforation or abscess without bleeding: Secondary | ICD-10-CM | POA: Diagnosis not present

## 2023-08-23 DIAGNOSIS — M159 Polyosteoarthritis, unspecified: Secondary | ICD-10-CM | POA: Diagnosis not present

## 2023-08-23 DIAGNOSIS — D649 Anemia, unspecified: Secondary | ICD-10-CM | POA: Diagnosis not present

## 2023-08-23 DIAGNOSIS — I10 Essential (primary) hypertension: Secondary | ICD-10-CM | POA: Diagnosis not present

## 2023-08-23 DIAGNOSIS — Z0000 Encounter for general adult medical examination without abnormal findings: Secondary | ICD-10-CM | POA: Diagnosis not present

## 2023-08-24 DIAGNOSIS — Z136 Encounter for screening for cardiovascular disorders: Secondary | ICD-10-CM | POA: Diagnosis not present

## 2023-08-24 DIAGNOSIS — Z79899 Other long term (current) drug therapy: Secondary | ICD-10-CM | POA: Diagnosis not present

## 2023-08-24 DIAGNOSIS — D649 Anemia, unspecified: Secondary | ICD-10-CM | POA: Diagnosis not present

## 2023-08-24 DIAGNOSIS — E559 Vitamin D deficiency, unspecified: Secondary | ICD-10-CM | POA: Diagnosis not present

## 2023-08-24 DIAGNOSIS — Z1159 Encounter for screening for other viral diseases: Secondary | ICD-10-CM | POA: Diagnosis not present

## 2023-08-28 DIAGNOSIS — E1122 Type 2 diabetes mellitus with diabetic chronic kidney disease: Secondary | ICD-10-CM | POA: Diagnosis not present

## 2023-08-28 DIAGNOSIS — I130 Hypertensive heart and chronic kidney disease with heart failure and stage 1 through stage 4 chronic kidney disease, or unspecified chronic kidney disease: Secondary | ICD-10-CM | POA: Diagnosis not present

## 2023-08-28 DIAGNOSIS — Z23 Encounter for immunization: Secondary | ICD-10-CM | POA: Diagnosis not present

## 2023-08-28 DIAGNOSIS — K449 Diaphragmatic hernia without obstruction or gangrene: Secondary | ICD-10-CM | POA: Diagnosis not present

## 2023-08-28 DIAGNOSIS — I509 Heart failure, unspecified: Secondary | ICD-10-CM | POA: Diagnosis not present

## 2023-08-28 DIAGNOSIS — E1169 Type 2 diabetes mellitus with other specified complication: Secondary | ICD-10-CM | POA: Diagnosis not present

## 2023-08-28 DIAGNOSIS — Z0001 Encounter for general adult medical examination with abnormal findings: Secondary | ICD-10-CM | POA: Diagnosis not present

## 2023-08-28 DIAGNOSIS — K219 Gastro-esophageal reflux disease without esophagitis: Secondary | ICD-10-CM | POA: Diagnosis not present

## 2023-08-28 DIAGNOSIS — E46 Unspecified protein-calorie malnutrition: Secondary | ICD-10-CM | POA: Diagnosis not present

## 2023-08-28 DIAGNOSIS — I700 Atherosclerosis of aorta: Secondary | ICD-10-CM | POA: Diagnosis not present

## 2023-09-13 ENCOUNTER — Ambulatory Visit
Admission: EM | Admit: 2023-09-13 | Discharge: 2023-09-13 | Disposition: A | Discharge status: Home / Self Care | Payer: Medicare Other | Attending: Physician Assistant | Admitting: Physician Assistant

## 2023-09-13 ENCOUNTER — Ambulatory Visit: Payer: Medicare Other

## 2023-09-13 DIAGNOSIS — M79672 Pain in left foot: Secondary | ICD-10-CM | POA: Diagnosis not present

## 2023-09-13 DIAGNOSIS — S92322A Displaced fracture of second metatarsal bone, left foot, initial encounter for closed fracture: Secondary | ICD-10-CM

## 2023-09-13 NOTE — ED Triage Notes (Signed)
Pt present injury to left foot, she dropped a large can on her foot last Saturday.  Pt states it hurt to put pressure on her left foot and walk.

## 2023-09-28 NOTE — ED Provider Notes (Addendum)
[facility]    CSN: [id number] Arrival date & time: 09/13/23  1548      History   Chief Complaint Chief Complaint  Patient presents with   Foot Pain    HPI Sabrina Rodriguez is a 72 y.o. female.   Here today for evaluation of injury to left foot.  She states that she accidentally dropped a large can on her foot last Saturday.  She states she continues to have pain when she is trying to walk up or applying pressure to the left foot.  She has not had any numbness or tingling.  The history is provided by the patient.  Foot Pain Pertinent negatives include no abdominal pain and no shortness of breath.    Past Medical History:  Diagnosis Date   Acid reflux    Diverticulitis    Hyperlipidemia    Hypertension    Type 2 diabetes mellitus     Patient Active Problem List   Diagnosis Date Noted   Acute renal failure (ARF) (HCC) 05/30/2021   Normocytic anemia 05/30/2021   Dehydration    Protein-calorie malnutrition, severe 01/14/2020   AKI (acute kidney injury) (HCC) 01/12/2020   Diabetes mellitus type II, non insulin dependent (HCC) 12/13/2019   Hypertension     History reviewed. No pertinent surgical history.  OB History   No obstetric history on file.      Home Medications    Prior to Admission medications   Medication Sig Start Date End Date Taking? Authorizing Provider  acetaminophen (TYLENOL) 325 MG tablet Take 2 tablets (650 mg total) by mouth every 6 (six) hours as needed for mild pain (or Fever >/= 101). 06/04/21   Calvert Cantor, MD  DULoxetine (CYMBALTA) 30 MG capsule Take 3 capsules (90 mg total) by mouth daily. 06/04/21   Calvert Cantor, MD  feeding supplement (ENSURE ENLIVE / ENSURE PLUS) LIQD Take 237 mLs by mouth 3 (three) times daily between meals. 06/04/21   Calvert Cantor, MD  Iron, Ferrous Sulfate, 325 (65 Fe) MG TABS Take 325 mg by mouth 2 (two) times daily. 12/17/19   Alwyn Ren, MD  metFORMIN (GLUCOPHAGE) 500 MG tablet Take 500 mg by  mouth at bedtime. 10/07/19   [provider]  Multiple Vitamin (MULTIVITAMIN WITH MINERALS) TABS tablet Take 1 tablet by mouth daily. 01/17/20   Mikhail, Nita Sells, DO  omeprazole (PRILOSEC) 40 MG capsule Take 40 mg by mouth daily. 10/07/19   [provider]  psyllium (HYDROCIL/METAMUCIL) 95 % PACK Take 1 packet by mouth 2 (two) times daily. 12/17/19   Alwyn Ren, MD  rosuvastatin (CRESTOR) 20 MG tablet Take 20 mg by mouth daily. 10/08/19   [provider]    Family History Family History  Problem Relation Age of Onset   Lung disease Mother    Hypertension Father    Hyperlipidemia Father    Bowel Disease Sister    Breast cancer Sister    Thyroid cancer Sister     Social History Social History   Tobacco Use   Smoking status: Never   Smokeless tobacco: Never  Substance Use Topics   Alcohol use: Yes    Comment: occasional glass of wine     Allergies   Chlorhexidine   Review of Systems Review of Systems  Constitutional:  Negative for chills and fever.  Eyes:  Negative for discharge and redness.  Respiratory:  Negative for shortness of breath.   Gastrointestinal:  Negative for abdominal pain, nausea and vomiting.  Musculoskeletal:  Positive for joint swelling. Negative for arthralgias.  Neurological:  Negative for numbness.     Physical Exam Triage Vital Signs ED Triage Vitals  Encounter Vitals Group     BP 09/13/23 1605 (!) 156/92     Systolic BP Percentile --      Diastolic BP Percentile --      Pulse Rate 09/13/23 1605 79     Resp 09/13/23 1605 16     Temp 09/13/23 1605 98 F (36.7 C)     Temp Source 09/13/23 1605 Oral     SpO2 09/13/23 1605 95 %     Weight --      Height --      Head Circumference --      Peak Flow --      Pain Score 09/13/23 1604 10     Pain Loc --      Pain Education --      Exclude from Growth Chart --    No data found.  Updated Vital Signs BP (!) 156/92 (BP Location: Right Arm)   Pulse 79   Temp  98 F (36.7 C) (Oral)   Resp 16   SpO2 95%     Physical Exam Vitals and nursing note reviewed.  Constitutional:      General: She is not in acute distress.    Appearance: Normal appearance. She is not ill-appearing.  HENT:     Head: Normocephalic and atraumatic.  Eyes:     Conjunctiva/sclera: Conjunctivae normal.  Cardiovascular:     Rate and Rhythm: Normal rate.  Pulmonary:     Effort: Pulmonary effort is normal. No respiratory distress.  Musculoskeletal:     Comments: Mild diffuse swelling to distal left foot, antalgic gait noted.  Neurological:     Mental Status: She is alert.  Psychiatric:        Mood and Affect: Mood normal.        Behavior: Behavior normal.        Thought Content: Thought content normal.      UC Treatments / Results  Labs (all labs ordered are listed, but only abnormal results are displayed) Labs Reviewed - No data to display  EKG   Radiology No results found.  Procedures Procedures (including critical care time)  Medications Ordered in UC Medications - No data to display  Initial Impression / Assessment and Plan / UC Course  I have reviewed the triage vital signs and the nursing notes.  Pertinent labs & imaging results that were available during my care of the patient were reviewed by me and considered in my medical decision making (see chart for details).    X-ray with fracture noted to metatarsal.  Recommended follow-up with Ortho soon as possible.  Final Clinical Impressions(s) / UC Diagnoses   Final diagnoses:  Closed displaced fracture of second metatarsal bone of left foot, initial encounter   Discharge Instructions   None    ED Prescriptions   None    PDMP not reviewed this encounter.   Tomi Bamberger, PA-C 09/28/23 1717    Tomi Bamberger, PA-C 09/28/23 [phone]

## 2023-10-06 ENCOUNTER — Other Ambulatory Visit: Payer: Self-pay | Admitting: Family Medicine

## 2023-10-06 DIAGNOSIS — K8689 Other specified diseases of pancreas: Secondary | ICD-10-CM

## 2023-10-06 DIAGNOSIS — K862 Cyst of pancreas: Secondary | ICD-10-CM

## 2023-11-17 ENCOUNTER — Other Ambulatory Visit: Payer: Self-pay | Admitting: Nurse Practitioner

## 2023-11-17 ENCOUNTER — Ambulatory Visit
Admission: RE | Admit: 2023-11-17 | Discharge: 2023-11-17 | Disposition: A | Discharge status: Home / Self Care | Payer: Medicare Other | Source: Ambulatory Visit | Attending: Nurse Practitioner | Admitting: Nurse Practitioner

## 2023-11-17 DIAGNOSIS — M25512 Pain in left shoulder: Secondary | ICD-10-CM

## 2023-11-26 ENCOUNTER — Other Ambulatory Visit: Payer: Medicare Other

## 2023-11-30 ENCOUNTER — Other Ambulatory Visit: Payer: Self-pay | Admitting: Family Medicine

## 2023-11-30 DIAGNOSIS — E2839 Other primary ovarian failure: Secondary | ICD-10-CM

## 2023-12-01 ENCOUNTER — Inpatient Hospital Stay: Admission: RE | Admit: 2023-12-01 | Payer: Medicare Other | Source: Ambulatory Visit

## 2023-12-06 ENCOUNTER — Other Ambulatory Visit: Payer: Medicare Other

## 2023-12-25 ENCOUNTER — Ambulatory Visit
Admission: RE | Admit: 2023-12-25 | Discharge: 2023-12-25 | Disposition: A | Discharge status: Home / Self Care | Payer: Medicare Other | Source: Ambulatory Visit | Attending: Family Medicine | Admitting: Family Medicine

## 2023-12-25 DIAGNOSIS — S4992XA Unspecified injury of left shoulder and upper arm, initial encounter: Secondary | ICD-10-CM | POA: Diagnosis not present

## 2023-12-25 DIAGNOSIS — K862 Cyst of pancreas: Secondary | ICD-10-CM

## 2023-12-25 DIAGNOSIS — K8689 Other specified diseases of pancreas: Secondary | ICD-10-CM

## 2023-12-25 DIAGNOSIS — K449 Diaphragmatic hernia without obstruction or gangrene: Secondary | ICD-10-CM | POA: Diagnosis not present

## 2023-12-25 DIAGNOSIS — I700 Atherosclerosis of aorta: Secondary | ICD-10-CM | POA: Diagnosis not present

## 2023-12-25 MED ORDER — GADOPICLENOL 0.5 MMOL/ML IV SOLN
7.5000 mL | Freq: Once | INTRAVENOUS | Status: AC | PRN
  Administered 2023-12-25: 7.5 mL via INTRAVENOUS

## 2024-01-07 ENCOUNTER — Other Ambulatory Visit: Payer: Medicare Other

## 2024-02-01 DIAGNOSIS — E1169 Type 2 diabetes mellitus with other specified complication: Secondary | ICD-10-CM | POA: Diagnosis not present

## 2024-02-01 DIAGNOSIS — K219 Gastro-esophageal reflux disease without esophagitis: Secondary | ICD-10-CM | POA: Diagnosis not present

## 2024-02-01 DIAGNOSIS — N1832 Chronic kidney disease, stage 3b: Secondary | ICD-10-CM | POA: Diagnosis not present

## 2024-02-01 DIAGNOSIS — I700 Atherosclerosis of aorta: Secondary | ICD-10-CM | POA: Diagnosis not present

## 2024-02-01 DIAGNOSIS — R609 Edema, unspecified: Secondary | ICD-10-CM | POA: Diagnosis not present

## 2024-02-01 DIAGNOSIS — M1990 Unspecified osteoarthritis, unspecified site: Secondary | ICD-10-CM | POA: Diagnosis not present

## 2024-02-01 DIAGNOSIS — N3281 Overactive bladder: Secondary | ICD-10-CM | POA: Diagnosis not present

## 2024-02-01 DIAGNOSIS — I10 Essential (primary) hypertension: Secondary | ICD-10-CM | POA: Diagnosis not present

## 2024-02-01 DIAGNOSIS — E7800 Pure hypercholesterolemia, unspecified: Secondary | ICD-10-CM | POA: Diagnosis not present

## 2024-02-06 DIAGNOSIS — Z961 Presence of intraocular lens: Secondary | ICD-10-CM | POA: Diagnosis not present

## 2024-02-06 DIAGNOSIS — E119 Type 2 diabetes mellitus without complications: Secondary | ICD-10-CM | POA: Diagnosis not present
